# Patient Record
Sex: Male | Born: 1940 | Race: White | Hispanic: No | Smoking: Former smoker
Health system: Southern US, Community
[De-identification: ages and names within clinical notes are randomized; demographics above are authoritative.]

## PROBLEM LIST (undated history)

## (undated) ENCOUNTER — Emergency Department (HOSPITAL_COMMUNITY): Admission: EM | Payer: Self-pay | Source: Home / Self Care

## (undated) DIAGNOSIS — I2510 Atherosclerotic heart disease of native coronary artery without angina pectoris: Secondary | ICD-10-CM

## (undated) DIAGNOSIS — I4891 Unspecified atrial fibrillation: Secondary | ICD-10-CM

## (undated) DIAGNOSIS — Z9289 Personal history of other medical treatment: Secondary | ICD-10-CM

## (undated) DIAGNOSIS — I5032 Chronic diastolic (congestive) heart failure: Secondary | ICD-10-CM

## (undated) DIAGNOSIS — E785 Hyperlipidemia, unspecified: Secondary | ICD-10-CM

## (undated) DIAGNOSIS — I7789 Other specified disorders of arteries and arterioles: Secondary | ICD-10-CM

## (undated) DIAGNOSIS — I9789 Other postprocedural complications and disorders of the circulatory system, not elsewhere classified: Secondary | ICD-10-CM

## (undated) HISTORY — DX: Personal history of other medical treatment: Z92.89

## (undated) HISTORY — DX: Hyperlipidemia, unspecified: E78.5

## (undated) HISTORY — DX: Atherosclerotic heart disease of native coronary artery without angina pectoris: I25.10

## (undated) HISTORY — PX: HERNIA REPAIR: SHX51

## (undated) HISTORY — DX: Unspecified atrial fibrillation: I97.89

## (undated) HISTORY — DX: Chronic diastolic (congestive) heart failure: I50.32

## (undated) HISTORY — DX: Unspecified atrial fibrillation: I48.91

## (undated) HISTORY — DX: Other specified disorders of arteries and arterioles: I77.89

---

## 2018-03-22 ENCOUNTER — Ambulatory Visit (HOSPITAL_COMMUNITY): Admit: 2018-03-22 | Payer: Self-pay | Admitting: Internal Medicine

## 2018-03-22 ENCOUNTER — Inpatient Hospital Stay (HOSPITAL_COMMUNITY): Payer: Medicare Other | Admitting: Certified Registered Nurse Anesthetist

## 2018-03-22 ENCOUNTER — Encounter (HOSPITAL_COMMUNITY): Payer: Self-pay | Admitting: Thoracic Surgery (Cardiothoracic Vascular Surgery)

## 2018-03-22 ENCOUNTER — Encounter (HOSPITAL_COMMUNITY)
Admission: EM | Disposition: A | Discharge status: Home Health | Payer: Self-pay | Source: Other Acute Inpatient Hospital | Attending: Thoracic Surgery (Cardiothoracic Vascular Surgery)

## 2018-03-22 ENCOUNTER — Inpatient Hospital Stay (HOSPITAL_COMMUNITY): Payer: Medicare Other

## 2018-03-22 ENCOUNTER — Inpatient Hospital Stay (HOSPITAL_COMMUNITY)
Admission: EM | Admit: 2018-03-22 | Discharge: 2018-03-31 | DRG: 233 | Disposition: A | Discharge status: Home Health | Payer: Medicare Other | Source: Other Acute Inpatient Hospital | Attending: Thoracic Surgery (Cardiothoracic Vascular Surgery) | Admitting: Thoracic Surgery (Cardiothoracic Vascular Surgery)

## 2018-03-22 DIAGNOSIS — Z87891 Personal history of nicotine dependence: Secondary | ICD-10-CM

## 2018-03-22 DIAGNOSIS — R001 Bradycardia, unspecified: Secondary | ICD-10-CM | POA: Diagnosis present

## 2018-03-22 DIAGNOSIS — D696 Thrombocytopenia, unspecified: Secondary | ICD-10-CM | POA: Diagnosis not present

## 2018-03-22 DIAGNOSIS — J9801 Acute bronchospasm: Secondary | ICD-10-CM | POA: Diagnosis not present

## 2018-03-22 DIAGNOSIS — I5021 Acute systolic (congestive) heart failure: Secondary | ICD-10-CM | POA: Diagnosis not present

## 2018-03-22 DIAGNOSIS — I2111 ST elevation (STEMI) myocardial infarction involving right coronary artery: Principal | ICD-10-CM | POA: Diagnosis present

## 2018-03-22 DIAGNOSIS — D72829 Elevated white blood cell count, unspecified: Secondary | ICD-10-CM | POA: Diagnosis not present

## 2018-03-22 DIAGNOSIS — I440 Atrioventricular block, first degree: Secondary | ICD-10-CM | POA: Diagnosis not present

## 2018-03-22 DIAGNOSIS — R066 Hiccough: Secondary | ICD-10-CM | POA: Diagnosis not present

## 2018-03-22 DIAGNOSIS — I480 Paroxysmal atrial fibrillation: Secondary | ICD-10-CM | POA: Diagnosis not present

## 2018-03-22 DIAGNOSIS — D62 Acute posthemorrhagic anemia: Secondary | ICD-10-CM | POA: Diagnosis not present

## 2018-03-22 DIAGNOSIS — I9789 Other postprocedural complications and disorders of the circulatory system, not elsewhere classified: Secondary | ICD-10-CM

## 2018-03-22 DIAGNOSIS — I4891 Unspecified atrial fibrillation: Secondary | ICD-10-CM

## 2018-03-22 DIAGNOSIS — Z951 Presence of aortocoronary bypass graft: Secondary | ICD-10-CM | POA: Diagnosis not present

## 2018-03-22 DIAGNOSIS — I25110 Atherosclerotic heart disease of native coronary artery with unstable angina pectoris: Secondary | ICD-10-CM

## 2018-03-22 DIAGNOSIS — Y838 Other surgical procedures as the cause of abnormal reaction of the patient, or of later complication, without mention of misadventure at the time of the procedure: Secondary | ICD-10-CM | POA: Diagnosis not present

## 2018-03-22 DIAGNOSIS — I2119 ST elevation (STEMI) myocardial infarction involving other coronary artery of inferior wall: Secondary | ICD-10-CM

## 2018-03-22 DIAGNOSIS — I255 Ischemic cardiomyopathy: Secondary | ICD-10-CM | POA: Diagnosis present

## 2018-03-22 DIAGNOSIS — I2510 Atherosclerotic heart disease of native coronary artery without angina pectoris: Secondary | ICD-10-CM

## 2018-03-22 DIAGNOSIS — N182 Chronic kidney disease, stage 2 (mild): Secondary | ICD-10-CM | POA: Diagnosis present

## 2018-03-22 DIAGNOSIS — I5030 Unspecified diastolic (congestive) heart failure: Secondary | ICD-10-CM | POA: Diagnosis not present

## 2018-03-22 DIAGNOSIS — M5030 Other cervical disc degeneration, unspecified cervical region: Secondary | ICD-10-CM | POA: Diagnosis present

## 2018-03-22 DIAGNOSIS — I25119 Atherosclerotic heart disease of native coronary artery with unspecified angina pectoris: Secondary | ICD-10-CM | POA: Diagnosis present

## 2018-03-22 DIAGNOSIS — Z9889 Other specified postprocedural states: Secondary | ICD-10-CM

## 2018-03-22 DIAGNOSIS — N179 Acute kidney failure, unspecified: Secondary | ICD-10-CM | POA: Diagnosis not present

## 2018-03-22 DIAGNOSIS — K5900 Constipation, unspecified: Secondary | ICD-10-CM | POA: Diagnosis not present

## 2018-03-22 DIAGNOSIS — R5381 Other malaise: Secondary | ICD-10-CM | POA: Diagnosis present

## 2018-03-22 DIAGNOSIS — E876 Hypokalemia: Secondary | ICD-10-CM | POA: Diagnosis not present

## 2018-03-22 DIAGNOSIS — J9811 Atelectasis: Secondary | ICD-10-CM

## 2018-03-22 DIAGNOSIS — T8131XA Disruption of external operation (surgical) wound, not elsewhere classified, initial encounter: Secondary | ICD-10-CM | POA: Diagnosis not present

## 2018-03-22 HISTORY — DX: Atherosclerotic heart disease of native coronary artery without angina pectoris: I25.10

## 2018-03-22 HISTORY — PX: TEE WITHOUT CARDIOVERSION: SHX5443

## 2018-03-22 HISTORY — PX: CORONARY/GRAFT ACUTE MI REVASCULARIZATION: CATH118305

## 2018-03-22 HISTORY — PX: IABP INSERTION: CATH118242

## 2018-03-22 HISTORY — PX: LEFT HEART CATH AND CORONARY ANGIOGRAPHY: CATH118249

## 2018-03-22 HISTORY — PX: CORONARY ARTERY BYPASS GRAFT: SHX141

## 2018-03-22 LAB — POCT I-STAT, CHEM 8
BUN: 14 mg/dL (ref 8–23)
BUN: 15 mg/dL (ref 8–23)
BUN: 14 mg/dL (ref 8–23)
BUN: 14 mg/dL (ref 8–23)
BUN: 16 mg/dL (ref 8–23)
BUN: 15 mg/dL (ref 8–23)
CALCIUM ION: 0.98 mmol/L — AB (ref 1.15–1.40)
CHLORIDE: 107 mmol/L (ref 98–111)
CHLORIDE: 108 mmol/L (ref 98–111)
CHLORIDE: 105 mmol/L (ref 98–111)
CHLORIDE: 105 mmol/L (ref 98–111)
CREATININE: 0.7 mg/dL (ref 0.61–1.24)
CREATININE: 0.7 mg/dL (ref 0.61–1.24)
Calcium, Ion: 1.19 mmol/L (ref 1.15–1.40)
Calcium, Ion: 1.13 mmol/L — ABNORMAL LOW (ref 1.15–1.40)
Calcium, Ion: 1 mmol/L — ABNORMAL LOW (ref 1.15–1.40)
Calcium, Ion: 1 mmol/L — ABNORMAL LOW (ref 1.15–1.40)
Calcium, Ion: 0.95 mmol/L — ABNORMAL LOW (ref 1.15–1.40)
Chloride: 105 mmol/L (ref 98–111)
Chloride: 107 mmol/L (ref 98–111)
Creatinine, Ser: 0.8 mg/dL (ref 0.61–1.24)
Creatinine, Ser: 0.9 mg/dL (ref 0.61–1.24)
Creatinine, Ser: 0.8 mg/dL (ref 0.61–1.24)
Creatinine, Ser: 0.8 mg/dL (ref 0.61–1.24)
GLUCOSE: 127 mg/dL — AB (ref 70–99)
Glucose, Bld: 108 mg/dL — ABNORMAL HIGH (ref 70–99)
Glucose, Bld: 138 mg/dL — ABNORMAL HIGH (ref 70–99)
Glucose, Bld: 186 mg/dL — ABNORMAL HIGH (ref 70–99)
Glucose, Bld: 189 mg/dL — ABNORMAL HIGH (ref 70–99)
Glucose, Bld: 168 mg/dL — ABNORMAL HIGH (ref 70–99)
HCT: 36 % — ABNORMAL LOW (ref 39.0–52.0)
HCT: 28 % — ABNORMAL LOW (ref 39.0–52.0)
HEMATOCRIT: 39 % (ref 39.0–52.0)
HEMATOCRIT: 30 % — AB (ref 39.0–52.0)
HEMATOCRIT: 28 % — AB (ref 39.0–52.0)
HEMATOCRIT: 26 % — AB (ref 39.0–52.0)
HEMOGLOBIN: 13.3 g/dL (ref 13.0–17.0)
HEMOGLOBIN: 8.8 g/dL — AB (ref 13.0–17.0)
Hemoglobin: 12.2 g/dL — ABNORMAL LOW (ref 13.0–17.0)
Hemoglobin: 10.2 g/dL — ABNORMAL LOW (ref 13.0–17.0)
Hemoglobin: 9.5 g/dL — ABNORMAL LOW (ref 13.0–17.0)
Hemoglobin: 9.5 g/dL — ABNORMAL LOW (ref 13.0–17.0)
POTASSIUM: 4 mmol/L (ref 3.5–5.1)
POTASSIUM: 5.3 mmol/L — AB (ref 3.5–5.1)
POTASSIUM: 5.2 mmol/L — AB (ref 3.5–5.1)
Potassium: 5.3 mmol/L — ABNORMAL HIGH (ref 3.5–5.1)
Potassium: 6 mmol/L — ABNORMAL HIGH (ref 3.5–5.1)
Potassium: 6.3 mmol/L (ref 3.5–5.1)
SODIUM: 139 mmol/L (ref 135–145)
SODIUM: 137 mmol/L (ref 135–145)
Sodium: 141 mmol/L (ref 135–145)
Sodium: 138 mmol/L (ref 135–145)
Sodium: 137 mmol/L (ref 135–145)
Sodium: 140 mmol/L (ref 135–145)
TCO2: 25 mmol/L (ref 22–32)
TCO2: 24 mmol/L (ref 22–32)
TCO2: 26 mmol/L (ref 22–32)
TCO2: 26 mmol/L (ref 22–32)
TCO2: 27 mmol/L (ref 22–32)
TCO2: 25 mmol/L (ref 22–32)

## 2018-03-22 LAB — PROTIME-INR
INR: 1.26
INR: 1.54
Prothrombin Time: 15.6 seconds — ABNORMAL HIGH (ref 11.4–15.2)
Prothrombin Time: 18.3 seconds — ABNORMAL HIGH (ref 11.4–15.2)

## 2018-03-22 LAB — POCT I-STAT 4, (NA,K, GLUC, HGB,HCT)
GLUCOSE: 151 mg/dL — AB (ref 70–99)
Glucose, Bld: 124 mg/dL — ABNORMAL HIGH (ref 70–99)
HCT: 38 % — ABNORMAL LOW (ref 39.0–52.0)
HEMATOCRIT: 30 % — AB (ref 39.0–52.0)
HEMOGLOBIN: 12.9 g/dL — AB (ref 13.0–17.0)
Hemoglobin: 10.2 g/dL — ABNORMAL LOW (ref 13.0–17.0)
POTASSIUM: 5.1 mmol/L (ref 3.5–5.1)
Potassium: 5.1 mmol/L (ref 3.5–5.1)
Sodium: 140 mmol/L (ref 135–145)
Sodium: 142 mmol/L (ref 135–145)

## 2018-03-22 LAB — LIPID PANEL
CHOL/HDL RATIO: 5.6 ratio
CHOLESTEROL: 225 mg/dL — AB (ref 0–200)
HDL: 40 mg/dL — ABNORMAL LOW (ref >40)
LDL Cholesterol: 169 mg/dL — ABNORMAL HIGH (ref 0–99)
Triglycerides: 78 mg/dL (ref <150)
VLDL: 16 mg/dL (ref 0–40)

## 2018-03-22 LAB — POCT I-STAT 3, ART BLOOD GAS (G3+)
ACID-BASE DEFICIT: 4 mmol/L — AB (ref 0.0–2.0)
ACID-BASE DEFICIT: 2 mmol/L (ref 0.0–2.0)
BICARBONATE: 23.2 mmol/L (ref 20.0–28.0)
Bicarbonate: 23 mmol/L (ref 20.0–28.0)
O2 SAT: 100 %
O2 Saturation: 100 %
PO2 ART: 312 mmHg — AB (ref 83.0–108.0)
PO2 ART: 367 mmHg — AB (ref 83.0–108.0)
TCO2: 25 mmol/L (ref 22–32)
TCO2: 24 mmol/L (ref 22–32)
pCO2 arterial: 49.9 mmHg — ABNORMAL HIGH (ref 32.0–48.0)
pCO2 arterial: 42.5 mmHg (ref 32.0–48.0)
pH, Arterial: 7.272 — ABNORMAL LOW (ref 7.350–7.450)
pH, Arterial: 7.345 — ABNORMAL LOW (ref 7.350–7.450)

## 2018-03-22 LAB — COMPREHENSIVE METABOLIC PANEL
ALBUMIN: 3.6 g/dL (ref 3.5–5.0)
ALT: 43 U/L (ref 0–44)
AST: 175 U/L — AB (ref 15–41)
Alkaline Phosphatase: 51 U/L (ref 38–126)
Anion gap: 8 (ref 5–15)
BILIRUBIN TOTAL: 0.7 mg/dL (ref 0.3–1.2)
BUN: 13 mg/dL (ref 8–23)
CO2: 19 mmol/L — ABNORMAL LOW (ref 22–32)
CREATININE: 0.96 mg/dL (ref 0.61–1.24)
Calcium: 8.5 mg/dL — ABNORMAL LOW (ref 8.9–10.3)
Chloride: 111 mmol/L (ref 98–111)
GFR calc Af Amer: >60 mL/min (ref >60)
GLUCOSE: 129 mg/dL — AB (ref 70–99)
Potassium: 4 mmol/L (ref 3.5–5.1)
Sodium: 138 mmol/L (ref 135–145)
TOTAL PROTEIN: 6.1 g/dL — AB (ref 6.5–8.1)

## 2018-03-22 LAB — CBC
HCT: 44.3 % (ref 39.0–52.0)
HEMATOCRIT: 33 % — AB (ref 39.0–52.0)
HEMOGLOBIN: 14.7 g/dL (ref 13.0–17.0)
Hemoglobin: 10.9 g/dL — ABNORMAL LOW (ref 13.0–17.0)
MCH: 30.8 pg (ref 26.0–34.0)
MCH: 31.9 pg (ref 26.0–34.0)
MCHC: 33.2 g/dL (ref 30.0–36.0)
MCHC: 33 g/dL (ref 30.0–36.0)
MCV: 92.7 fL (ref 80.0–100.0)
MCV: 96.5 fL (ref 80.0–100.0)
NRBC: 0.1 % (ref 0.0–0.2)
Platelets: 222 10*3/uL (ref 150–400)
Platelets: 145 10*3/uL — ABNORMAL LOW (ref 150–400)
RBC: 4.78 MIL/uL (ref 4.22–5.81)
RBC: 3.42 MIL/uL — ABNORMAL LOW (ref 4.22–5.81)
RDW: 12.5 % (ref 11.5–15.5)
RDW: 12.7 % (ref 11.5–15.5)
WBC: 11.9 10*3/uL — AB (ref 4.0–10.5)
WBC: 23.7 10*3/uL — AB (ref 4.0–10.5)
nRBC: 0 % (ref 0.0–0.2)

## 2018-03-22 LAB — HEMOGLOBIN A1C
HEMOGLOBIN A1C: 5.4 % (ref 4.8–5.6)
MEAN PLASMA GLUCOSE: 108.28 mg/dL

## 2018-03-22 LAB — BASIC METABOLIC PANEL
Anion gap: 7 (ref 5–15)
BUN: 13 mg/dL (ref 8–23)
CHLORIDE: 110 mmol/L (ref 98–111)
CO2: 21 mmol/L — ABNORMAL LOW (ref 22–32)
CREATININE: 0.92 mg/dL (ref 0.61–1.24)
Calcium: 8.3 mg/dL — ABNORMAL LOW (ref 8.9–10.3)
GFR calc Af Amer: >60 mL/min (ref >60)
GFR calc non Af Amer: >60 mL/min (ref >60)
GLUCOSE: 111 mg/dL — AB (ref 70–99)
POTASSIUM: 3.8 mmol/L (ref 3.5–5.1)
Sodium: 138 mmol/L (ref 135–145)

## 2018-03-22 LAB — PLATELET COUNT: Platelets: 211 10*3/uL (ref 150–400)

## 2018-03-22 LAB — APTT: APTT: 41 s — AB (ref 24–36)

## 2018-03-22 LAB — POCT ACTIVATED CLOTTING TIME
ACTIVATED CLOTTING TIME: 230 s
ACTIVATED CLOTTING TIME: 230 s
Activated Clotting Time: 197 seconds
Activated Clotting Time: 230 seconds
Activated Clotting Time: 224 seconds
Activated Clotting Time: 235 seconds

## 2018-03-22 LAB — HEMOGLOBIN AND HEMATOCRIT, BLOOD
HCT: 30.6 % — ABNORMAL LOW (ref 39.0–52.0)
Hemoglobin: 10.5 g/dL — ABNORMAL LOW (ref 13.0–17.0)

## 2018-03-22 LAB — TROPONIN I: TROPONIN I: 15.1 ng/mL — AB (ref <0.03)

## 2018-03-22 LAB — ABO/RH: ABO/RH(D): A POS

## 2018-03-22 SURGERY — CORONARY ARTERY BYPASS GRAFTING (CABG)
Anesthesia: General | Site: Chest

## 2018-03-22 SURGERY — LEFT HEART CATH AND CORONARY ANGIOGRAPHY
Anesthesia: LOCAL

## 2018-03-22 MED ORDER — SODIUM CHLORIDE 0.9 % IV SOLN
1.5000 g | Freq: Two times a day (BID) | INTRAVENOUS | Status: AC
  Administered 2018-03-23 – 2018-03-24 (×4): 1.5 g via INTRAVENOUS
  Filled 2018-03-22 (×4): qty 1.5

## 2018-03-22 MED ORDER — POTASSIUM CHLORIDE 2 MEQ/ML IV SOLN
80.0000 meq | INTRAVENOUS | Status: DC
  Filled 2018-03-22 (×2): qty 40

## 2018-03-22 MED ORDER — MAGNESIUM SULFATE 4 GM/100ML IV SOLN
4.0000 g | Freq: Once | INTRAVENOUS | Status: AC
  Administered 2018-03-22: 4 g via INTRAVENOUS
  Filled 2018-03-22: qty 100

## 2018-03-22 MED ORDER — ALBUMIN HUMAN 5 % IV SOLN
250.0000 mL | INTRAVENOUS | Status: AC | PRN
  Administered 2018-03-22 – 2018-03-23 (×4): 12.5 g via INTRAVENOUS
  Filled 2018-03-22: qty 500
  Filled 2018-03-22: qty 250

## 2018-03-22 MED ORDER — NITROGLYCERIN IN D5W 200-5 MCG/ML-% IV SOLN
2.0000 ug/min | INTRAVENOUS | Status: AC
  Administered 2018-03-22: 15 ug/min via INTRAVENOUS
  Filled 2018-03-22: qty 250

## 2018-03-22 MED ORDER — POTASSIUM CHLORIDE 10 MEQ/50ML IV SOLN: 10.0000 meq | INTRAVENOUS | Status: DC

## 2018-03-22 MED ORDER — INSULIN REGULAR BOLUS VIA INFUSION
0.0000 [IU] | Freq: Three times a day (TID) | INTRAVENOUS | Status: DC
  Filled 2018-03-22: qty 10

## 2018-03-22 MED ORDER — METOPROLOL TARTRATE 12.5 MG HALF TABLET: 12.5000 mg | ORAL_TABLET | Freq: Two times a day (BID) | ORAL | Status: DC

## 2018-03-22 MED ORDER — CANGRELOR BOLUS VIA INFUSION
INTRAVENOUS | Status: DC | PRN
  Administered 2018-03-22: 2880 ug via INTRAVENOUS

## 2018-03-22 MED ORDER — ASPIRIN 81 MG PO CHEW: 81.0000 mg | CHEWABLE_TABLET | Freq: Every day | ORAL | Status: DC

## 2018-03-22 MED ORDER — PROPOFOL 10 MG/ML IV BOLUS
INTRAVENOUS | Status: AC
  Filled 2018-03-22: qty 20

## 2018-03-22 MED ORDER — TRANEXAMIC ACID (OHS) BOLUS VIA INFUSION
15.0000 mg/kg | INTRAVENOUS | Status: AC
  Administered 2018-03-22: 1440 mg via INTRAVENOUS
  Filled 2018-03-22: qty 1440

## 2018-03-22 MED ORDER — OXYCODONE HCL 5 MG PO TABS
5.0000 mg | ORAL_TABLET | ORAL | Status: DC | PRN
  Administered 2018-03-23: 10 mg via ORAL
  Administered 2018-03-23: 5 mg via ORAL
  Administered 2018-03-23 – 2018-03-25 (×5): 10 mg via ORAL
  Filled 2018-03-22: qty 2
  Filled 2018-03-22: qty 1
  Filled 2018-03-22 (×6): qty 2

## 2018-03-22 MED ORDER — HEPARIN (PORCINE) IN NACL 1000-0.9 UT/500ML-% IV SOLN
INTRAVENOUS | Status: AC
  Filled 2018-03-22: qty 500

## 2018-03-22 MED ORDER — ROCURONIUM BROMIDE 50 MG/5ML IV SOSY
PREFILLED_SYRINGE | INTRAVENOUS | Status: AC
  Filled 2018-03-22: qty 10

## 2018-03-22 MED ORDER — METOPROLOL TARTRATE 25 MG/10 ML ORAL SUSPENSION: 12.5000 mg | Freq: Two times a day (BID) | ORAL | Status: DC

## 2018-03-22 MED ORDER — NOREPINEPHRINE 4 MG/250ML-% IV SOLN
0.0000 ug/min | INTRAVENOUS | Status: DC
  Filled 2018-03-22: qty 250

## 2018-03-22 MED ORDER — FAMOTIDINE IN NACL 20-0.9 MG/50ML-% IV SOLN
20.0000 mg | Freq: Two times a day (BID) | INTRAVENOUS | Status: AC
  Administered 2018-03-22 – 2018-03-23 (×2): 20 mg via INTRAVENOUS
  Filled 2018-03-22: qty 50

## 2018-03-22 MED ORDER — PROTAMINE SULFATE 10 MG/ML IV SOLN
INTRAVENOUS | Status: DC | PRN
  Administered 2018-03-22: 140 mg via INTRAVENOUS
  Administered 2018-03-22: 10 mg via INTRAVENOUS
  Administered 2018-03-22: 150 mg via INTRAVENOUS

## 2018-03-22 MED ORDER — ONDANSETRON HCL 4 MG/2ML IJ SOLN: 4.0000 mg | Freq: Four times a day (QID) | INTRAMUSCULAR | Status: DC | PRN

## 2018-03-22 MED ORDER — 0.9 % SODIUM CHLORIDE (POUR BTL) OPTIME
TOPICAL | Status: DC | PRN
  Administered 2018-03-22: 1000 mL
  Administered 2018-03-22: 5000 mL

## 2018-03-22 MED ORDER — FENTANYL CITRATE (PF) 250 MCG/5ML IJ SOLN
INTRAMUSCULAR | Status: AC
  Filled 2018-03-22: qty 20

## 2018-03-22 MED ORDER — SODIUM CHLORIDE 0.45 % IV SOLN
INTRAVENOUS | Status: DC | PRN
  Administered 2018-03-22 – 2018-03-23 (×2): via INTRAVENOUS

## 2018-03-22 MED ORDER — VECURONIUM BROMIDE 10 MG IV SOLR
INTRAVENOUS | Status: DC | PRN
  Administered 2018-03-22 (×2): 5 mg via INTRAVENOUS
  Administered 2018-03-22: 3 mg via INTRAVENOUS

## 2018-03-22 MED ORDER — PLASMA-LYTE 148 IV SOLN
INTRAVENOUS | Status: DC | PRN
  Administered 2018-03-22: 500 mL

## 2018-03-22 MED ORDER — EPINEPHRINE PF 1 MG/ML IJ SOLN
0.0000 ug/min | INTRAVENOUS | Status: DC
  Filled 2018-03-22 (×2): qty 4

## 2018-03-22 MED ORDER — BISACODYL 10 MG RE SUPP: 10.0000 mg | Freq: Every day | RECTAL | Status: DC

## 2018-03-22 MED ORDER — PHENYLEPHRINE 40 MCG/ML (10ML) SYRINGE FOR IV PUSH (FOR BLOOD PRESSURE SUPPORT)
PREFILLED_SYRINGE | INTRAVENOUS | Status: AC
  Filled 2018-03-22: qty 10

## 2018-03-22 MED ORDER — PHENYLEPHRINE HCL-NACL 20-0.9 MG/250ML-% IV SOLN
30.0000 ug/min | INTRAVENOUS | Status: AC
  Administered 2018-03-22: 25 ug/min via INTRAVENOUS
  Filled 2018-03-22 (×2): qty 250

## 2018-03-22 MED ORDER — VANCOMYCIN HCL 1000 MG IV SOLR
INTRAVENOUS | Status: DC | PRN
  Administered 2018-03-22: 1000 mL

## 2018-03-22 MED ORDER — SODIUM CHLORIDE 0.9 % IV SOLN
INTRAVENOUS | Status: DC | PRN
  Administered 2018-03-22: 18:00:00 via INTRAVENOUS

## 2018-03-22 MED ORDER — CANGRELOR TETRASODIUM 50 MG IV SOLR
INTRAVENOUS | Status: AC
  Filled 2018-03-22: qty 50

## 2018-03-22 MED ORDER — CHLORHEXIDINE GLUCONATE 0.12 % MT SOLN
15.0000 mL | OROMUCOSAL | Status: AC
  Administered 2018-03-22: 15 mL via OROMUCOSAL

## 2018-03-22 MED ORDER — ALBUMIN HUMAN 5 % IV SOLN
INTRAVENOUS | Status: DC | PRN
  Administered 2018-03-22 (×3): via INTRAVENOUS

## 2018-03-22 MED ORDER — SODIUM CHLORIDE 0.9 % IV SOLN
INTRAVENOUS | Status: DC | PRN
  Administered 2018-03-22: 750 mg via INTRAVENOUS

## 2018-03-22 MED ORDER — FENTANYL CITRATE (PF) 250 MCG/5ML IJ SOLN
INTRAMUSCULAR | Status: AC
  Filled 2018-03-22: qty 5

## 2018-03-22 MED ORDER — DEXMEDETOMIDINE HCL IN NACL 400 MCG/100ML IV SOLN
0.1000 ug/kg/h | INTRAVENOUS | Status: AC
  Administered 2018-03-22: 0.7 ug/kg/h via INTRAVENOUS
  Filled 2018-03-22 (×2): qty 1000

## 2018-03-22 MED ORDER — SODIUM CHLORIDE 0.9 % IV SOLN: 250.0000 mL | INTRAVENOUS | Status: DC | PRN

## 2018-03-22 MED ORDER — ASPIRIN EC 325 MG PO TBEC
325.0000 mg | DELAYED_RELEASE_TABLET | Freq: Every day | ORAL | Status: DC
  Administered 2018-03-23: 325 mg via ORAL
  Filled 2018-03-22: qty 1

## 2018-03-22 MED ORDER — VERAPAMIL HCL 2.5 MG/ML IV SOLN
INTRAVENOUS | Status: AC
  Filled 2018-03-22: qty 2

## 2018-03-22 MED ORDER — MORPHINE SULFATE (PF) 2 MG/ML IV SOLN
1.0000 mg | INTRAVENOUS | Status: AC | PRN
  Administered 2018-03-22: 2 mg via INTRAVENOUS
  Filled 2018-03-22: qty 1

## 2018-03-22 MED ORDER — CALCIUM CHLORIDE 10 % IV SOLN
INTRAVENOUS | Status: DC | PRN
  Administered 2018-03-22 (×8): 100 mg via INTRAVENOUS

## 2018-03-22 MED ORDER — LACTATED RINGERS IV SOLN
INTRAVENOUS | Status: DC | PRN
  Administered 2018-03-22 (×3): via INTRAVENOUS

## 2018-03-22 MED ORDER — SODIUM CHLORIDE 0.9 % IV SOLN
INTRAVENOUS | Status: DC
  Filled 2018-03-22 (×2): qty 30

## 2018-03-22 MED ORDER — NOREPINEPHRINE 4 MG/250ML-% IV SOLN
0.0000 ug/min | INTRAVENOUS | Status: DC
  Administered 2018-03-22 – 2018-03-24 (×3): 5 ug/min via INTRAVENOUS
  Filled 2018-03-22 (×3): qty 250

## 2018-03-22 MED ORDER — LACTATED RINGERS IV SOLN
INTRAVENOUS | Status: DC | PRN
  Administered 2018-03-22 (×2): via INTRAVENOUS

## 2018-03-22 MED ORDER — SODIUM CHLORIDE 0.9 % IV SOLN
INTRAVENOUS | Status: DC
  Administered 2018-03-22: 21:00:00 via INTRAVENOUS
  Administered 2018-03-23: 500 mL via INTRAVENOUS

## 2018-03-22 MED ORDER — VANCOMYCIN HCL 1000 MG IV SOLR
INTRAVENOUS | Status: AC
  Filled 2018-03-22 (×2): qty 1000

## 2018-03-22 MED ORDER — NITROGLYCERIN IN D5W 200-5 MCG/ML-% IV SOLN: 0.0000 ug/min | INTRAVENOUS | Status: DC

## 2018-03-22 MED ORDER — LACTATED RINGERS IV SOLN
INTRAVENOUS | Status: DC
  Administered 2018-03-22: 21:00:00 via INTRAVENOUS

## 2018-03-22 MED ORDER — ONDANSETRON HCL 4 MG/2ML IJ SOLN
INTRAMUSCULAR | Status: AC
  Filled 2018-03-22: qty 2

## 2018-03-22 MED ORDER — VANCOMYCIN HCL 10 G IV SOLR
1500.0000 mg | INTRAVENOUS | Status: AC
  Administered 2018-03-22: 1500 mg via INTRAVENOUS
  Filled 2018-03-22 (×2): qty 1500

## 2018-03-22 MED ORDER — PROPOFOL 10 MG/ML IV BOLUS
INTRAVENOUS | Status: DC | PRN
  Administered 2018-03-22: 250 mg via INTRAVENOUS

## 2018-03-22 MED ORDER — PROTAMINE SULFATE 10 MG/ML IV SOLN
INTRAVENOUS | Status: AC
  Filled 2018-03-22: qty 25

## 2018-03-22 MED ORDER — MIDAZOLAM HCL 5 MG/5ML IJ SOLN
INTRAMUSCULAR | Status: DC | PRN
  Administered 2018-03-22 (×3): 2 mg via INTRAVENOUS

## 2018-03-22 MED ORDER — PANTOPRAZOLE SODIUM 40 MG PO TBEC
40.0000 mg | DELAYED_RELEASE_TABLET | Freq: Every day | ORAL | Status: DC
  Administered 2018-03-24 – 2018-03-31 (×8): 40 mg via ORAL
  Filled 2018-03-22 (×8): qty 1

## 2018-03-22 MED ORDER — SODIUM CHLORIDE 0.9 % IV SOLN: 4.0000 ug/kg/min | INTRAVENOUS | Status: DC

## 2018-03-22 MED ORDER — DOPAMINE-DEXTROSE 3.2-5 MG/ML-% IV SOLN
0.0000 ug/kg/min | INTRAVENOUS | Status: AC
  Administered 2018-03-22: 2 ug/kg/min via INTRAVENOUS
  Filled 2018-03-22: qty 250

## 2018-03-22 MED ORDER — ACETAMINOPHEN 500 MG PO TABS
1000.0000 mg | ORAL_TABLET | Freq: Four times a day (QID) | ORAL | Status: AC
  Administered 2018-03-23 – 2018-03-27 (×18): 1000 mg via ORAL
  Filled 2018-03-22 (×19): qty 2

## 2018-03-22 MED ORDER — HEPARIN (PORCINE) IN NACL 1000-0.9 UT/500ML-% IV SOLN
INTRAVENOUS | Status: DC | PRN
  Administered 2018-03-22 (×3): 500 mL

## 2018-03-22 MED ORDER — ACETAMINOPHEN 650 MG RE SUPP
650.0000 mg | Freq: Once | RECTAL | Status: AC
  Administered 2018-03-22: 650 mg via RECTAL

## 2018-03-22 MED ORDER — HEPARIN SODIUM (PORCINE) 1000 UNIT/ML IJ SOLN
INTRAMUSCULAR | Status: DC | PRN
  Administered 2018-03-22 (×4): 2000 [IU] via INTRAVENOUS
  Administered 2018-03-22: 4000 [IU] via INTRAVENOUS
  Administered 2018-03-22: 5000 [IU] via INTRAVENOUS

## 2018-03-22 MED ORDER — FENTANYL CITRATE (PF) 100 MCG/2ML IJ SOLN
INTRAMUSCULAR | Status: DC | PRN
  Administered 2018-03-22: 150 ug via INTRAVENOUS
  Administered 2018-03-22: 50 ug via INTRAVENOUS
  Administered 2018-03-22: 250 ug via INTRAVENOUS
  Administered 2018-03-22: 50 ug via INTRAVENOUS
  Administered 2018-03-22: 100 ug via INTRAVENOUS
  Administered 2018-03-22: 50 ug via INTRAVENOUS
  Administered 2018-03-22: 500 ug via INTRAVENOUS
  Administered 2018-03-22 (×2): 100 ug via INTRAVENOUS

## 2018-03-22 MED ORDER — MIDAZOLAM HCL 2 MG/2ML IJ SOLN
2.0000 mg | INTRAMUSCULAR | Status: DC | PRN
  Administered 2018-03-22 (×2): 1 mg via INTRAVENOUS
  Filled 2018-03-22: qty 2

## 2018-03-22 MED ORDER — BISACODYL 5 MG PO TBEC
10.0000 mg | DELAYED_RELEASE_TABLET | Freq: Every day | ORAL | Status: DC
  Administered 2018-03-23 – 2018-03-31 (×7): 10 mg via ORAL
  Filled 2018-03-22 (×8): qty 2

## 2018-03-22 MED ORDER — LIDOCAINE 2% (20 MG/ML) 5 ML SYRINGE
INTRAMUSCULAR | Status: AC
  Filled 2018-03-22: qty 5

## 2018-03-22 MED ORDER — VECURONIUM BROMIDE 10 MG IV SOLR
INTRAVENOUS | Status: AC
  Filled 2018-03-22: qty 10

## 2018-03-22 MED ORDER — SODIUM CHLORIDE 0.9% FLUSH
3.0000 mL | Freq: Two times a day (BID) | INTRAVENOUS | Status: DC
  Administered 2018-03-22: 10 mL via INTRAVENOUS
  Administered 2018-03-23 (×2): 3 mL via INTRAVENOUS

## 2018-03-22 MED ORDER — SODIUM CHLORIDE 0.9% FLUSH: 3.0000 mL | INTRAVENOUS | Status: DC | PRN

## 2018-03-22 MED ORDER — ACETAMINOPHEN 160 MG/5ML PO SOLN: 650.0000 mg | Freq: Once | ORAL | Status: AC

## 2018-03-22 MED ORDER — ACETAMINOPHEN 160 MG/5ML PO SOLN: 1000.0000 mg | Freq: Four times a day (QID) | ORAL | Status: DC

## 2018-03-22 MED ORDER — TRAMADOL HCL 50 MG PO TABS
50.0000 mg | ORAL_TABLET | ORAL | Status: DC | PRN
  Administered 2018-03-24 – 2018-03-25 (×3): 50 mg via ORAL
  Administered 2018-03-26: 100 mg via ORAL
  Filled 2018-03-22: qty 2
  Filled 2018-03-22 (×3): qty 1

## 2018-03-22 MED ORDER — MIDAZOLAM HCL 10 MG/2ML IJ SOLN
INTRAMUSCULAR | Status: AC
  Filled 2018-03-22: qty 2

## 2018-03-22 MED ORDER — CALCIUM CHLORIDE 10 % IV SOLN
INTRAVENOUS | Status: AC
  Filled 2018-03-22: qty 20

## 2018-03-22 MED ORDER — TRANEXAMIC ACID 1000 MG/10ML IV SOLN
1.5000 mg/kg/h | INTRAVENOUS | Status: AC
  Administered 2018-03-22: 1.5 mg/kg/h via INTRAVENOUS
  Filled 2018-03-22 (×2): qty 25

## 2018-03-22 MED ORDER — SODIUM CHLORIDE 0.9% FLUSH
3.0000 mL | Freq: Two times a day (BID) | INTRAVENOUS | Status: DC
  Administered 2018-03-23 – 2018-03-25 (×4): 3 mL via INTRAVENOUS

## 2018-03-22 MED ORDER — PLASMA-LYTE 148 IV SOLN
INTRAVENOUS | Status: AC
  Filled 2018-03-22 (×2): qty 2.5

## 2018-03-22 MED ORDER — SODIUM BICARBONATE 8.4 % IV SOLN
50.0000 meq | Freq: Once | INTRAVENOUS | Status: AC
  Administered 2018-03-22: 50 meq via INTRAVENOUS

## 2018-03-22 MED ORDER — ASPIRIN 81 MG PO CHEW: 324.0000 mg | CHEWABLE_TABLET | Freq: Every day | ORAL | Status: DC

## 2018-03-22 MED ORDER — KENNESTONE BLOOD CARDIOPLEGIA VIAL
13.0000 mL | Freq: Once | Status: DC
  Filled 2018-03-22: qty 13

## 2018-03-22 MED ORDER — KENNESTONE BLOOD CARDIOPLEGIA (KBC) MANNITOL SYRINGE (20%, 32ML)
32.0000 mL | Freq: Once | INTRAVENOUS | Status: DC
  Filled 2018-03-22: qty 32

## 2018-03-22 MED ORDER — HYDRALAZINE HCL 20 MG/ML IJ SOLN: 5.0000 mg | INTRAMUSCULAR | Status: DC | PRN

## 2018-03-22 MED ORDER — DOCUSATE SODIUM 100 MG PO CAPS
200.0000 mg | ORAL_CAPSULE | Freq: Every day | ORAL | Status: DC
  Administered 2018-03-23 – 2018-03-31 (×8): 200 mg via ORAL
  Filled 2018-03-22 (×10): qty 2

## 2018-03-22 MED ORDER — MILRINONE LACTATE IN DEXTROSE 20-5 MG/100ML-% IV SOLN
0.2000 ug/kg/min | INTRAVENOUS | Status: DC
  Administered 2018-03-22: 0.2 ug/kg/min via INTRAVENOUS

## 2018-03-22 MED ORDER — INSULIN REGULAR(HUMAN) IN NACL 100-0.9 UT/100ML-% IV SOLN
INTRAVENOUS | Status: AC
  Administered 2018-03-22: .5 [IU]/h via INTRAVENOUS
  Filled 2018-03-22 (×2): qty 100

## 2018-03-22 MED ORDER — INSULIN REGULAR(HUMAN) IN NACL 100-0.9 UT/100ML-% IV SOLN: INTRAVENOUS | Status: DC

## 2018-03-22 MED ORDER — SODIUM CHLORIDE 0.9 % IV SOLN: INTRAVENOUS | Status: DC | PRN

## 2018-03-22 MED ORDER — ATROPINE SULFATE 1 MG/10ML IJ SOSY
PREFILLED_SYRINGE | INTRAMUSCULAR | Status: AC
  Filled 2018-03-22: qty 10

## 2018-03-22 MED ORDER — PROTAMINE SULFATE 10 MG/ML IV SOLN
INTRAVENOUS | Status: AC
  Filled 2018-03-22: qty 5

## 2018-03-22 MED ORDER — SODIUM CHLORIDE 0.9 % IJ SOLN
INTRAMUSCULAR | Status: AC
  Filled 2018-03-22: qty 20

## 2018-03-22 MED ORDER — MILRINONE LACTATE IN DEXTROSE 20-5 MG/100ML-% IV SOLN
0.3000 ug/kg/min | INTRAVENOUS | Status: AC
  Administered 2018-03-22: .2 ug/kg/min via INTRAVENOUS
  Filled 2018-03-22 (×2): qty 100

## 2018-03-22 MED ORDER — SODIUM CHLORIDE 0.9 % IV SOLN
1.5000 g | INTRAVENOUS | Status: AC
  Administered 2018-03-22: 1.5 g via INTRAVENOUS
  Filled 2018-03-22 (×2): qty 1.5

## 2018-03-22 MED ORDER — SODIUM CHLORIDE 0.9 % IV SOLN: 250.0000 mL | INTRAVENOUS | Status: DC

## 2018-03-22 MED ORDER — SODIUM CHLORIDE 0.9 % IV SOLN
INTRAVENOUS | Status: AC | PRN
  Administered 2018-03-22 (×2): 4 ug/kg/min via INTRAVENOUS

## 2018-03-22 MED ORDER — TRANEXAMIC ACID (OHS) PUMP PRIME SOLUTION
2.0000 mg/kg | INTRAVENOUS | Status: DC
  Filled 2018-03-22 (×2): qty 1.92

## 2018-03-22 MED ORDER — IOHEXOL 350 MG/ML SOLN
INTRAVENOUS | Status: DC | PRN
  Administered 2018-03-22: 90 mL via INTRA_ARTERIAL

## 2018-03-22 MED ORDER — DEXMEDETOMIDINE HCL IN NACL 200 MCG/50ML IV SOLN
INTRAVENOUS | Status: AC
  Filled 2018-03-22: qty 50

## 2018-03-22 MED ORDER — MORPHINE SULFATE (PF) 2 MG/ML IV SOLN
1.0000 mg | INTRAVENOUS | Status: DC | PRN
  Administered 2018-03-23 – 2018-03-26 (×6): 2 mg via INTRAVENOUS
  Filled 2018-03-22 (×7): qty 1

## 2018-03-22 MED ORDER — SODIUM CHLORIDE 0.9 % IV SOLN
INTRAVENOUS | Status: DC
  Administered 2018-03-22: 22:00:00 via INTRAVENOUS

## 2018-03-22 MED ORDER — ROCURONIUM BROMIDE 100 MG/10ML IV SOLN
INTRAVENOUS | Status: DC | PRN
  Administered 2018-03-22 (×2): 50 mg via INTRAVENOUS

## 2018-03-22 MED ORDER — HEPARIN SODIUM (PORCINE) 1000 UNIT/ML IJ SOLN
INTRAMUSCULAR | Status: DC | PRN
  Administered 2018-03-22: 27000 [IU] via INTRAVENOUS
  Administered 2018-03-22: 3000 [IU] via INTRAVENOUS

## 2018-03-22 MED ORDER — ATROPINE SULFATE 1 MG/10ML IJ SOSY
PREFILLED_SYRINGE | INTRAMUSCULAR | Status: DC | PRN
  Administered 2018-03-22: 0.5 mg via INTRAVENOUS

## 2018-03-22 MED ORDER — DEXMEDETOMIDINE HCL IN NACL 400 MCG/100ML IV SOLN
0.0000 ug/kg/h | INTRAVENOUS | Status: DC
  Administered 2018-03-22: 0.5 ug/kg/h via INTRAVENOUS

## 2018-03-22 MED ORDER — LACTATED RINGERS IV SOLN
500.0000 mL | Freq: Once | INTRAVENOUS | Status: AC | PRN
  Administered 2018-03-22: 500 mL via INTRAVENOUS

## 2018-03-22 MED ORDER — LIDOCAINE HCL (PF) 1 % IJ SOLN
INTRAMUSCULAR | Status: DC | PRN
  Administered 2018-03-22: 2 mL
  Administered 2018-03-22: 20 mL

## 2018-03-22 MED ORDER — LACTATED RINGERS IV SOLN
INTRAVENOUS | Status: DC
  Administered 2018-03-22 – 2018-03-23 (×2): via INTRAVENOUS

## 2018-03-22 MED ORDER — NITROGLYCERIN 1 MG/10 ML FOR IR/CATH LAB
INTRA_ARTERIAL | Status: AC
  Filled 2018-03-22: qty 10

## 2018-03-22 MED ORDER — ROCURONIUM BROMIDE 50 MG/5ML IV SOSY
PREFILLED_SYRINGE | INTRAVENOUS | Status: AC
  Filled 2018-03-22: qty 5

## 2018-03-22 MED ORDER — ACETAMINOPHEN 325 MG PO TABS: 650.0000 mg | ORAL_TABLET | ORAL | Status: DC | PRN

## 2018-03-22 MED ORDER — SODIUM CHLORIDE 0.9 % IV SOLN
750.0000 mg | INTRAVENOUS | Status: AC
  Filled 2018-03-22 (×2): qty 750

## 2018-03-22 MED ORDER — METOPROLOL TARTRATE 5 MG/5ML IV SOLN: 2.5000 mg | INTRAVENOUS | Status: DC | PRN

## 2018-03-22 MED ORDER — PHENYLEPHRINE HCL-NACL 20-0.9 MG/250ML-% IV SOLN
0.0000 ug/min | INTRAVENOUS | Status: DC
  Administered 2018-03-22: 50 ug/min via INTRAVENOUS
  Administered 2018-03-23: 100 ug/min via INTRAVENOUS
  Administered 2018-03-23: 70 ug/min via INTRAVENOUS
  Administered 2018-03-23: 90 ug/min via INTRAVENOUS
  Administered 2018-03-23 (×2): 100 ug/min via INTRAVENOUS
  Administered 2018-03-24: 55 ug/min via INTRAVENOUS
  Filled 2018-03-22 (×7): qty 250

## 2018-03-22 MED ORDER — HEMOSTATIC AGENTS (NO CHARGE) OPTIME
TOPICAL | Status: DC | PRN
  Administered 2018-03-22: 1 via TOPICAL
  Administered 2018-03-22: 3 via TOPICAL

## 2018-03-22 MED ORDER — MAGNESIUM SULFATE 50 % IJ SOLN
40.0000 meq | INTRAMUSCULAR | Status: DC
  Filled 2018-03-22 (×2): qty 9.85

## 2018-03-22 MED ORDER — DOPAMINE-DEXTROSE 3.2-5 MG/ML-% IV SOLN: 0.0000 ug/kg/min | INTRAVENOUS | Status: DC

## 2018-03-22 MED ORDER — VANCOMYCIN HCL IN DEXTROSE 1-5 GM/200ML-% IV SOLN
1000.0000 mg | Freq: Once | INTRAVENOUS | Status: AC
  Administered 2018-03-23: 1000 mg via INTRAVENOUS
  Filled 2018-03-22: qty 200

## 2018-03-22 SURGICAL SUPPLY — 114 items
ADAPTER CARDIO PERF ANTE/RETRO (ADAPTER) ×3 IMPLANT
APPLIER CLIP 9.375 SM OPEN (CLIP) ×6
BAG DECANTER FOR FLEXI CONT (MISCELLANEOUS) ×6 IMPLANT
BANDAGE ELASTIC 4 VELCRO ST LF (GAUZE/BANDAGES/DRESSINGS) ×6 IMPLANT
BANDAGE ELASTIC 6 VELCRO ST LF (GAUZE/BANDAGES/DRESSINGS) ×6 IMPLANT
BASKET HEART (ORDER IN 25'S) (MISCELLANEOUS) ×1
BASKET HEART (ORDER IN 25S) (MISCELLANEOUS) ×2 IMPLANT
BLADE CLIPPER SURG (BLADE) ×3 IMPLANT
BLADE STERNUM SYSTEM 6 (BLADE) ×3 IMPLANT
BLADE SURG 11 STRL SS (BLADE) ×3 IMPLANT
BNDG GAUZE ELAST 4 BULKY (GAUZE/BANDAGES/DRESSINGS) ×6 IMPLANT
CANISTER SUCT 3000ML PPV (MISCELLANEOUS) ×3 IMPLANT
CANNULA EZ GLIDE AORTIC 21FR (CANNULA) ×3 IMPLANT
CANNULA GUNDRY RCSP 15FR (MISCELLANEOUS) ×3 IMPLANT
CANNULA VESSEL 3MM BLUNT TIP (CANNULA) ×3 IMPLANT
CATH CPB KIT OWEN (MISCELLANEOUS) ×3 IMPLANT
CATH THORACIC 36FR (CATHETERS) ×3 IMPLANT
CLIP APPLIE 9.375 SM OPEN (CLIP) ×4 IMPLANT
CLIP RETRACTION 3.0MM CORONARY (MISCELLANEOUS) ×3 IMPLANT
CLIP VESOCCLUDE MED 24/CT (CLIP) IMPLANT
CLIP VESOCCLUDE SM WIDE 24/CT (CLIP) ×9 IMPLANT
CONN ST 1/4X3/8  BEN (MISCELLANEOUS) ×3
CONN ST 1/4X3/8 BEN (MISCELLANEOUS) ×6 IMPLANT
COVER PROBE W GEL 5X96 (DRAPES) ×3 IMPLANT
COVER WAND RF STERILE (DRAPES) ×6 IMPLANT
CRADLE DONUT ADULT HEAD (MISCELLANEOUS) ×3 IMPLANT
DERMABOND ADVANCED (GAUZE/BANDAGES/DRESSINGS) ×2
DERMABOND ADVANCED .7 DNX12 (GAUZE/BANDAGES/DRESSINGS) ×4 IMPLANT
DRAIN CHANNEL 32F RND 10.7 FF (WOUND CARE) ×6 IMPLANT
DRAPE CARDIOVASCULAR INCISE (DRAPES) ×1
DRAPE INCISE IOBAN 66X45 STRL (DRAPES) ×6 IMPLANT
DRAPE SLUSH/WARMER DISC (DRAPES) ×3 IMPLANT
DRAPE SRG 135X102X78XABS (DRAPES) ×2 IMPLANT
DRSG AQUACEL AG ADV 3.5X14 (GAUZE/BANDAGES/DRESSINGS) ×3 IMPLANT
DRSG COVADERM 4X14 (GAUZE/BANDAGES/DRESSINGS) ×3 IMPLANT
ELECT BLADE 4.0 EZ CLEAN MEGAD (MISCELLANEOUS) ×6
ELECT REM PT RETURN 9FT ADLT (ELECTROSURGICAL) ×6
ELECTRODE BLDE 4.0 EZ CLN MEGD (MISCELLANEOUS) ×4 IMPLANT
ELECTRODE REM PT RTRN 9FT ADLT (ELECTROSURGICAL) ×4 IMPLANT
FELT TEFLON 1X6 (MISCELLANEOUS) ×3 IMPLANT
GAUZE SPONGE 4X4 12PLY STRL LF (GAUZE/BANDAGES/DRESSINGS) ×9 IMPLANT
GLOVE BIO SURGEON STRL SZ 6.5 (GLOVE) ×18 IMPLANT
GLOVE BIOGEL PI IND STRL 8 (GLOVE) ×4 IMPLANT
GLOVE BIOGEL PI INDICATOR 8 (GLOVE) ×2
GLOVE ECLIPSE 8.0 STRL XLNG CF (GLOVE) ×9 IMPLANT
GLOVE ORTHO TXT STRL SZ7.5 (GLOVE) ×9 IMPLANT
GOWN STRL REUS W/ TWL LRG LVL3 (GOWN DISPOSABLE) ×14 IMPLANT
GOWN STRL REUS W/TWL LRG LVL3 (GOWN DISPOSABLE) ×7
INSERT FOGARTY XLG (MISCELLANEOUS) ×3 IMPLANT
IV CATH 22GX1 FEP (IV SOLUTION) ×3 IMPLANT
KIT BASIN OR (CUSTOM PROCEDURE TRAY) ×3 IMPLANT
KIT SUCTION CATH 14FR (SUCTIONS) ×9 IMPLANT
KIT TURNOVER KIT B (KITS) ×3 IMPLANT
KIT VASOVIEW HEMOPRO 2 VH 4000 (KITS) ×3 IMPLANT
LEAD PACING MYOCARDI (MISCELLANEOUS) ×3 IMPLANT
MARKER GRAFT CORONARY BYPASS (MISCELLANEOUS) ×9 IMPLANT
NS IRRIG 1000ML POUR BTL (IV SOLUTION) ×18 IMPLANT
PACK E OPEN HEART (SUTURE) ×3 IMPLANT
PACK OPEN HEART (CUSTOM PROCEDURE TRAY) ×3 IMPLANT
PAD ARMBOARD 7.5X6 YLW CONV (MISCELLANEOUS) ×6 IMPLANT
PAD ELECT DEFIB RADIOL ZOLL (MISCELLANEOUS) ×3 IMPLANT
PENCIL BUTTON HOLSTER BLD 10FT (ELECTRODE) ×6 IMPLANT
POWDER SURGICEL 3.0 GRAM (HEMOSTASIS) ×12 IMPLANT
PUNCH AORTIC ROT 4.0MM RCL 40 (MISCELLANEOUS) ×3 IMPLANT
PUNCH AORTIC ROTATE 4.0MM (MISCELLANEOUS) IMPLANT
PUNCH AORTIC ROTATE 4.5MM 8IN (MISCELLANEOUS) IMPLANT
PUNCH AORTIC ROTATE 5MM 8IN (MISCELLANEOUS) IMPLANT
SET CARDIOPLEGIA MPS 5001102 (MISCELLANEOUS) ×3 IMPLANT
SOLUTION ANTI FOG 6CC (MISCELLANEOUS) IMPLANT
SPONGE LAP 18X18 RF (DISPOSABLE) ×6 IMPLANT
SPONGE LAP 18X18 X RAY DECT (DISPOSABLE) ×24 IMPLANT
SPONGE LAP 4X18 RFD (DISPOSABLE) ×6 IMPLANT
SUT BONE WAX W31G (SUTURE) ×3 IMPLANT
SUT ETHIBOND NAB MH 2-0 36IN (SUTURE) ×6 IMPLANT
SUT ETHIBOND X763 2 0 SH 1 (SUTURE) ×6 IMPLANT
SUT MNCRL AB 3-0 PS2 18 (SUTURE) ×6 IMPLANT
SUT MNCRL AB 4-0 PS2 18 (SUTURE) ×9 IMPLANT
SUT PDS AB 1 CTX 36 (SUTURE) ×6 IMPLANT
SUT PROLENE 2 0 SH DA (SUTURE) IMPLANT
SUT PROLENE 3 0 SH DA (SUTURE) ×6 IMPLANT
SUT PROLENE 3 0 SH1 36 (SUTURE) IMPLANT
SUT PROLENE 4 0 RB 1 (SUTURE)
SUT PROLENE 4 0 SH DA (SUTURE) ×3 IMPLANT
SUT PROLENE 4-0 RB1 .5 CRCL 36 (SUTURE) IMPLANT
SUT PROLENE 5 0 C 1 36 (SUTURE) IMPLANT
SUT PROLENE 6 0 C 1 30 (SUTURE) ×12 IMPLANT
SUT PROLENE 7 0 BV1 MDA (SUTURE) ×3 IMPLANT
SUT PROLENE 7.0 RB 3 (SUTURE) ×21 IMPLANT
SUT PROLENE 8 0 BV175 6 (SUTURE) ×18 IMPLANT
SUT PROLENE BLUE 7 0 (SUTURE) ×3 IMPLANT
SUT PROLENE POLY MONO (SUTURE) IMPLANT
SUT SILK  1 MH (SUTURE) ×3
SUT SILK 1 MH (SUTURE) ×6 IMPLANT
SUT STEEL 6MS V (SUTURE) IMPLANT
SUT STEEL STERNAL CCS#1 18IN (SUTURE) IMPLANT
SUT STEEL SZ 6 DBL 3X14 BALL (SUTURE) ×9 IMPLANT
SUT VIC AB 1 CTX 36 (SUTURE)
SUT VIC AB 1 CTX36XBRD ANBCTR (SUTURE) IMPLANT
SUT VIC AB 2-0 CT1 27 (SUTURE) ×4
SUT VIC AB 2-0 CT1 TAPERPNT 27 (SUTURE) ×8 IMPLANT
SUT VIC AB 2-0 CTX 27 (SUTURE) IMPLANT
SUT VIC AB 3-0 SH 27 (SUTURE)
SUT VIC AB 3-0 SH 27X BRD (SUTURE) IMPLANT
SUT VIC AB 3-0 X1 27 (SUTURE) IMPLANT
SUT VICRYL 4-0 PS2 18IN ABS (SUTURE) IMPLANT
SYSTEM SAHARA CHEST DRAIN ATS (WOUND CARE) ×3 IMPLANT
TAPE CLOTH SURG 4X10 WHT LF (GAUZE/BANDAGES/DRESSINGS) ×6 IMPLANT
TAPE PAPER 2X10 WHT MICROPORE (GAUZE/BANDAGES/DRESSINGS) ×3 IMPLANT
TOWEL GREEN STERILE (TOWEL DISPOSABLE) ×3 IMPLANT
TOWEL GREEN STERILE FF (TOWEL DISPOSABLE) ×3 IMPLANT
TRAY FOLEY SLVR 16FR TEMP STAT (SET/KITS/TRAYS/PACK) ×3 IMPLANT
TUBING INSUFFLATION (TUBING) ×3 IMPLANT
UNDERPAD 30X30 (UNDERPADS AND DIAPERS) ×3 IMPLANT
WATER STERILE IRR 1000ML POUR (IV SOLUTION) ×6 IMPLANT

## 2018-03-22 SURGICAL SUPPLY — 20 items
BALLN IABP SENSA PLUS 8F 50CC (BALLOONS) ×2
BALLN SAPPHIRE 2.5X15 (BALLOONS) ×2
BALLOON IABP SENS PLUS 8F 50CC (BALLOONS) ×1 IMPLANT
BALLOON SAPPHIRE 2.5X15 (BALLOONS) ×1 IMPLANT
CATH 5FR JL3.5 JR4 ANG PIG MP (CATHETERS) ×2 IMPLANT
CATH VISTA GUIDE 6FR JR4 (CATHETERS) ×2 IMPLANT
ELECT DEFIB PAD ADLT CADENCE (PAD) ×2 IMPLANT
GLIDESHEATH SLEND SS 6F .021 (SHEATH) ×2 IMPLANT
GUIDEWIRE INQWIRE 1.5J.035X260 (WIRE) ×1 IMPLANT
INQWIRE 1.5J .035X260CM (WIRE) ×2
KIT ENCORE 26 ADVANTAGE (KITS) ×2 IMPLANT
KIT HEART LEFT (KITS) ×2 IMPLANT
KIT MICROPUNCTURE NIT STIFF (SHEATH) ×2 IMPLANT
PACK CARDIAC CATHETERIZATION (CUSTOM PROCEDURE TRAY) ×2 IMPLANT
SHEATH PINNACLE 5F 10CM (SHEATH) ×2 IMPLANT
SHEATH PROBE COVER 6X72 (BAG) ×2 IMPLANT
TRANSDUCER W/STOPCOCK (MISCELLANEOUS) ×2 IMPLANT
TUBING CIL FLEX 10 FLL-RA (TUBING) ×2 IMPLANT
WIRE HI TORQ BMW 190CM (WIRE) ×2 IMPLANT
WIRE RUNTHROUGH .014X180CM (WIRE) ×2 IMPLANT

## 2018-03-22 NOTE — Transfer of Care (Signed)
Immediate Anesthesia Transfer of Care Note  Patient: David [name]  Procedure(s) Performed: CORONARY ARTERY BYPASS GRAFTING (CABG) x 3. ENDOSCOPIC HARVESTING OF RIGHT GREATER SPAPHENOUS VEIN AND OPEN HARVESTING OF LEFT GREATER SAPHENOUS VEIN. BILATERAL IMA (N/A Chest) TRANSESOPHAGEAL ECHOCARDIOGRAM (TEE) (N/A )  Patient Location: SICU  Anesthesia Type:General  Level of Consciousness: sedated and Patient remains intubated per anesthesia plan  Airway & Oxygen Therapy: Patient remains intubated per anesthesia plan and Patient placed on Ventilator (see vital sign flow sheet for setting)  Post-op Assessment: Report given to RN and Post -op Vital signs reviewed and stable  Post vital signs: Reviewed and stable  Last Vitals:  Vitals Value Taken Time  BP    Temp 37.3 C 03/22/2018  9:14 PM  Pulse 111 03/22/2018  9:14 PM  Resp 11 03/22/2018  9:14 PM  SpO2 99 % 03/22/2018  9:14 PM  Vitals shown include unvalidated device data.  Last Pain:  Vitals:   03/22/18 1209  PainSc: 1          Complications: No apparent anesthesia complications   Dr. Cornelius Moras at bedside upon arrival.  Neo increased to 58mcg/min for BP 85-90/55-60.  Albumin started by receiving nurse per Dr. Barry Dienes order.

## 2018-03-22 NOTE — Progress Notes (Signed)
  Echocardiogram 2D Echocardiogram has been performed.  Gerda Diss 03/22/2018, 2:56 PM

## 2018-03-22 NOTE — Progress Notes (Deleted)
*  PRELIMINARY RESULTS* Echocardiogram Echocardiogram Transesophageal has been performed.  Gerda Diss 03/22/2018, 2:56 PM

## 2018-03-22 NOTE — Anesthesia Postprocedure Evaluation (Signed)
Anesthesia Post Note  Patient: David [name]  Procedure(s) Performed: CORONARY ARTERY BYPASS GRAFTING (CABG) x 3. ENDOSCOPIC HARVESTING OF RIGHT GREATER SPAPHENOUS VEIN AND OPEN HARVESTING OF LEFT GREATER SAPHENOUS VEIN. BILATERAL IMA (N/A Chest) TRANSESOPHAGEAL ECHOCARDIOGRAM (TEE) (N/A )     Patient location during evaluation: SICU Anesthesia Type: General Level of consciousness: sedated Pain management: pain level controlled Vital Signs Assessment: post-procedure vital signs reviewed and stable Respiratory status: patient remains intubated per anesthesia plan Cardiovascular status: stable Postop Assessment: no apparent nausea or vomiting Anesthetic complications: no    Last Vitals:  Vitals:   03/22/18 1113 03/22/18 2100  BP:    Pulse: 88   Resp: (!) 9 12  SpO2: (!) 89%     Last Pain:  Vitals:   03/22/18 1209  PainSc: 1                  Kennieth Rad

## 2018-03-22 NOTE — Progress Notes (Signed)
[facility] BRIEF SICU PROGRESS NOTE  Day of Surgery  S/P Procedure(s) (LRB): CORONARY ARTERY BYPASS GRAFTING (CABG) x 3. ENDOSCOPIC HARVESTING OF RIGHT GREATER SPAPHENOUS VEIN AND OPEN HARVESTING OF LEFT GREATER SAPHENOUS VEIN. BILATERAL IMA (N/A) TRANSESOPHAGEAL ECHOCARDIOGRAM (TEE) (N/A)   Sedated on vent NSR w/ stable hemodynamics Chest tube output low UOP excellent Labs pending IABP too low in aorta >> repositioned at bedside  Plan: Continue routine early postop.  D/C radial sheath.  Purcell Nails, MD 03/22/2018 9:28 PM

## 2018-03-22 NOTE — Progress Notes (Signed)
   03/22/18 1100  Clinical Encounter Type  Visited With Family  Visit Type Initial;Code  Referral From Other (Comment) (code STEMI)   Called ED bridge prior to expected arrival of pt to ck, pt had already arrived and was in Cath lab.    Spoke w/ ED waiting rm, pt's family there, I went down to accompany to [place], but they had gone up shortly after I called.  Met w/ pt's 2 daughters in [place] waiting, confirmed that Cath lab knew they were there, let them know that chaplain may be requested by asking staff.  Myra Gianotti resident, [phone]

## 2018-03-22 NOTE — Brief Op Note (Addendum)
03/22/2018  8:11 PM  PATIENT:  David [name]  77 y.o. male  PRE-OPERATIVE DIAGNOSIS:  1. S/p STEMI 2.CAD  POST-OPERATIVE DIAGNOSIS: 1. S/p STEMI 2.CAD  PROCEDURE:  TRANSESOPHAGEAL ECHOCARDIOGRAM (TEE), EMERGENT CORONARY ARTERY BYPASS GRAFTING (CABG) x 3 (LIMA to LAD, FREE RIMA to OM, SVG to PDA) with  ENDOSCOPIC HARVESTING OF RIGHT GREATER SPAPHENOUS VEIN AND OPEN HARVESTING OF LEFT GREATER SAPHENOUS VEIN and  BILATERAL IMA s  SURGEON:  Surgeon(s) and Role:    Purcell Nails, MD - Primary  PHYSICIAN ASSISTANT: Doree Fudge PA-C  ASSISTANTS:Diana Domenica Fail RNFA  ANESTHESIA:   general  EBL:  1900   DRAINS: Chest tubes placed in the mediastinal and pleural spaces   COUNTS CORRECT:  YES  DICTATION: .Dragon Dictation  PLAN OF CARE: Admit to inpatient   PATIENT DISPOSITION:  ICU - intubated and hemodynamically stable.   Delay start of Pharmacological VTE agent (>24hrs) due to surgical blood loss or risk of bleeding: yes  BASELINE WEIGHT: 96 kg

## 2018-03-22 NOTE — Anesthesia Procedure Notes (Signed)
Arterial Line Insertion Start/End10/28/2019 12:50 PM, 03/22/2018 12:54 PM Performed by: Izola Price., CRNA, CRNA  Patient location: Pre-op. Preanesthetic checklist: patient identified, IV checked, site marked, risks and benefits discussed, surgical consent, monitors and equipment checked, pre-op evaluation, timeout performed and anesthesia consent Lidocaine 1% used for infiltration Left, radial was placed Catheter size: 20 G Hand hygiene performed , maximum sterile barriers used  and Seldinger technique used Allen's test indicative of satisfactory collateral circulation Attempts: 1 Procedure performed without using ultrasound guided technique. Following insertion, dressing applied and Biopatch. Post procedure assessment: normal and unchanged  Patient tolerated the procedure well with no immediate complications.

## 2018-03-22 NOTE — Op Note (Signed)
CARDIOTHORACIC SURGERY OPERATIVE NOTE  Date of Procedure: 03/22/2018  Preoperative Diagnosis:   Severe 3-vessel Coronary Artery Disease  Acute ST-segment Elevation Myocardial Infarction  Postoperative Diagnosis: Same  Procedure:    Emergency Coronary Artery Bypass Grafting x 3   Left Internal Mammary Artery to Distal Left Anterior Descending Coronary Artery  Free Right Internal Mammary Artery to Obtuse Marginal Branch of Left Circumflex Coronary Artery  Reversed Saphenous Greater Vein Graft to Posterior Descending Coronary Artery  Endoscopic Saphenous Vein Harvest from Right Thigh and Lower Leg  Open Saphenous Vein Harvest from Left Thigh and Lower Leg  Surgeon: David [name]. David Moras, MD  Assistant: David Balls, PA-C  Anesthesia: David Bruce, MD and David Duos, MD  Operative Findings:  Moderate left ventricular systolic dysfunction with inferior wall akinesis, EF 35-David%  Good quality left and right internal mammary arteries for conduit  Poor quality saphenous vein conduit, most of which was discarded  Fair quality target vessels for grafting    BRIEF CLINICAL NOTE AND INDICATIONS FOR SURGERY  Patient is a 77 year old [name] with no previous history of coronary artery disease who has been referred for emergent surgical consultation for management of severe three-vessel coronary artery disease with ongoing acute ST segment elevation myocardial infarction.  Patient states that he does not have a primary care physician.  He reportedly had been seen on 2 previous occasions as an outpatient for episodes of chest pain and were told that his symptoms were likely caused by indigestion.  The patient reports that over the past month he has had progressive symptoms of substernal chest pain radiating to his left arm with physical exertion.  Symptoms have been associated with shortness of breath and usually brought on with walking up a hill.  He says up until this morning  symptoms always relieved by rest.  This morning he woke up per his usual and drove to his daughter's home at approximately 7:30am.  When he got out of his car and started walking up the hill he began to experience chest pain that was more severe and associated with shortness of breath.  Symptoms became quite intense and persisted, prompting his daughter to take him directly to the emergency department at [facility] in [place].  There EKG revealed diffuse ST segment elevation across the inferior leads.  The patient was transported directly to the [facility] cardiac Cath Lab where catheterization was performed by Dr. Okey Dupre.  The patient was found to have severe three-vessel coronary artery disease including acute thrombotic occlusion of the right coronary artery.  Primary balloon angioplasty of the right coronary artery was performed.  The patient's chest pain improved but did not resolve completely.  Emergent cardiothoracic surgical consultation was requested and an intra-aortic balloon pump was placed.  The patient has been seen in consultation and counseled in the cath lab regarding the indications, risks and potential benefits of surgery.  All questions have been answered, and the patient provides full informed consent for the operation as described.  Patient is brought directly to the operating room with decreased but ongoing chest pain in stable condition.    DETAILS OF THE OPERATIVE PROCEDURE  Preparation:  The patient is brought to the operating room on the above mentioned date and central monitoring was established by the anesthesia team including placement of Swan-Ganz catheter and left radial arterial line. The patient is placed in the supine position on the operating table.  Intravenous antibiotics are administered. General endotracheal anesthesia is induced uneventfully.  A Foley catheter is placed.  Baseline transesophageal echocardiogram was performed by Dr. Michelle [name], results to  be provided in a separate note.  The patient's chest, abdomen, both groins, and both lower extremities are prepared and draped in a sterile manner. A time out procedure is performed.   Surgical Approach and Conduit Harvest:  A median sternotomy incision was performed and the left internal mammary artery is dissected from the chest wall and prepared for bypass grafting. The left internal mammary artery is notably good quality conduit. Simultaneously, the greater saphenous vein is obtained from the patient's right thigh and leg using endoscopic vein harvest technique. The saphenous vein is notably poor quality conduit, and only a single segment is suitable for grafting.  Subsequently the greater saphenous vein is removed from the patient's left thigh and left lower leg using open vein harvest technique.  This vein is also notably poor quality conduit with areas that are small in caliber and other areas which are sclerotic and fibrotic.  Eventually a decision is made to dissect the right internal mammary artery from the chest wall and prepare it for bypass grafting.  After removal of the saphenous vein, the surgical incisions in the both lower extremities are closed with absorbable suture. Following systemic heparinization, the left and right internal mammary arteries were transected distally and each noted to have excellent flow.   Extracorporeal Cardiopulmonary Bypass and Myocardial Protection:  The pericardium is opened. The ascending aorta is mildly dilated and sclerotic in appearance.  There is some hard palpable plaque noted at the takeoff of the innominate artery.  The ascending aorta and the right atrium are cannulated for cardiopulmonary bypass.  Adequate heparinization is verified.    A retrograde cardioplegia cannula is placed through the right atrium into the coronary sinus.  The entire pre-bypass portion of the operation was notable for stable hemodynamics.  Cardiopulmonary bypass was begun  and the surface of the heart is inspected. Distal target vessels are selected for coronary artery bypass grafting.  The right internal mammary artery is not long enough to reach any target vessel as an in situ graft.  Subsequently the right internal mammary artery is transected proximally just after its takeoff from the subclavian artery.  The proximal stump was oversewn with suture ligatures.  A cardioplegia cannula is placed in the ascending aorta.  A temperature probe was placed in the interventricular septum.  The patient is allowed to cool passively to [facility] systemic temperature.  The aortic cross clamp is applied and cold blood cardioplegia is delivered initially in an antegrade fashion through the aortic root.  Supplemental cardioplegia is given retrograde through the coronary sinus catheter.  Iced saline slush is applied for topical hypothermia.  The initial cardioplegic arrest is rapid with early diastolic arrest.  Repeat doses of cardioplegia are administered intermittently throughout the entire cross clamp portion of the operation through the aortic root, through the coronary sinus catheter, and through subsequently placed vein grafts in order to maintain completely flat electrocardiogram and septal myocardial temperature below 15C.  Myocardial protection was felt to be excellent.  Coronary Artery Bypass Grafting:   The posterior descending branch of the right coronary artery was grafted using a reversed saphenous vein graft in an end-to-side fashion.  At the site of distal anastomosis the target vessel was fair quality and measured approximately 1.8 mm in diameter.  There is some atherosclerotic plaque in the vessel both proximal and distal to the site of distal anastomosis, but a 1.5 mm  probe will pass in both directions and all the way into the right coronary artery.  The obtuse marginal branch of the left circumflex coronary artery was grafted using the right internal mammary artery in an  end-to-side fashion.  At the site of distal anastomosis the target vessel was good quality and measured approximately 1.7 mm in diameter.  The distal left anterior coronary artery was grafted with the left internal mammary artery in an end-to-side fashion.  At the site of distal anastomosis the target vessel was fair quality and measured approximately 1.5 mm in diameter.  The left anterior descending coronary artery was chronically occluded proximally.  The single proximal vein graft anastomosis was placed directly to the ascending aorta prior to removal of the aortic cross clamp.  A second short piece of good quality saphenous vein conduit was also sewn directly to the ascending aorta in end-to-side fashion prior to removal of the aortic cross-clamp.  The septal myocardial temperature rose rapidly after reperfusion of the left internal mammary artery graft.  The aortic cross clamp was removed after a total cross clamp time of 81 minutes.  The proximal end of the right internal mammary artery graft was sewn to the short segment of greater saphenous vein as a very short interposition graft because of size mismatch between the proximal end of the arterial graft and the aorta.   Procedure Completion:  All proximal and distal coronary anastomoses were inspected for hemostasis and appropriate graft orientation. Epicardial pacing wires are fixed to the right ventricular outflow tract and to the right atrial appendage. The patient is rewarmed to 37C temperature. The patient is weaned and disconnected from cardiopulmonary bypass.  The patient's rhythm at separation from bypass was sinus.  The patient was weaned from cardiopulmonary bypass on milrinone at 0.2 mcg/kg/min with intra-aortic balloon counterpulsation at 1:1. Total cardiopulmonary bypass time for the operation was 123 minutes.  Followup transesophageal echocardiogram performed after separation from bypass revealed inferior wall hypokinesis which  improved over time.  There was mild central mitral regurgitation.  The aortic and venous cannula were removed uneventfully. Protamine was administered to reverse the anticoagulation. The mediastinum and pleural space were inspected for hemostasis and irrigated with saline solution. The mediastinum and both pleural spaces were drained using 4 chest tubes placed through separate stab incisions inferiorly.  The soft tissues anterior to the aorta were reapproximated loosely. The sternum is closed with double strength sternal wire. The soft tissues anterior to the sternum were closed in multiple layers and the skin is closed with a running subcuticular skin closure.  The post-bypass portion of the operation was notable for stable rhythm and hemodynamics.  No blood products were administered during the operation.   Disposition:  The patient tolerated the procedure well and is transported to the surgical intensive care in stable condition. There are no intraoperative complications. All sponge instrument and needle counts are verified correct at completion of the operation.    David [name]. David Moras MD 03/22/2018 8:16 PM

## 2018-03-22 NOTE — Anesthesia Preprocedure Evaluation (Signed)
Anesthesia Evaluation  Patient identified by MRN, date of birth, ID band Patient awake    Reviewed: Allergy & Precautions, NPO status , Patient's Chart, lab work & pertinent test results  Airway Mallampati: I  TM Distance: >3 FB Neck ROM: Full    Dental   Pulmonary    Pulmonary exam normal        Cardiovascular + CAD and + Past MI  Normal cardiovascular exam     Neuro/Psych    GI/Hepatic   Endo/Other    Renal/GU      Musculoskeletal   Abdominal   Peds  Hematology   Anesthesia Other Findings   Reproductive/Obstetrics                             Anesthesia Physical Anesthesia Plan  ASA: IV and emergent  Anesthesia Plan: General   Post-op Pain Management:    Induction: Intravenous  PONV Risk Score and Plan: Treatment may vary due to age or medical condition  Airway Management Planned: Oral ETT  Additional Equipment: Arterial line, CVP, PA Cath, TEE and Ultrasound Guidance Line Placement  Intra-op Plan:   Post-operative Plan: Post-operative intubation/ventilation  Informed Consent: I have reviewed the patients History and Physical, chart, labs and discussed the procedure including the risks, benefits and alternatives for the proposed anesthesia with the patient or authorized representative who has indicated his/her understanding and acceptance.     Plan Discussed with: CRNA and Surgeon  Anesthesia Plan Comments:         Anesthesia Quick Evaluation

## 2018-03-22 NOTE — Progress Notes (Signed)
Called to 2h11 for radial sheath removal. 6Fr radial sheath in right radial artery. Right hand mottled and slightly cold. Patient arm band removed and a new one placed on left arm. 2fr sheath aspirated and removed from right radial artery. TR band applied and adjusted to 10cc air. No bleeding or hematoma present. Spo2 98% as measured in right hand. Patient sedated and intubated.  Band applied at 22:25:00 @ 10 cc

## 2018-03-22 NOTE — Consult Note (Signed)
[address]       [place] [address]             [phone]          CARDIOTHORACIC SURGERY CONSULTATION REPORT  PCP is No primary care provider on file. Referring Provider is End, Harrell Gave, MD  Reason for consultation:  Severe 3-vessel CAD w/ ongoing acute ST-segment elevation myocardial infarction  HPI:  Patient is a 77 year old male with no previous history of coronary artery disease who has been referred for emergent surgical consultation for management of severe three-vessel coronary artery disease with ongoing acute ST segment elevation myocardial infarction.  Patient states that he does not have a primary care physician.  He reportedly had been seen on 2 previous occasions as an outpatient for episodes of chest pain and were told that his symptoms were likely caused by indigestion.  The patient reports that over the past month he has had progressive symptoms of substernal chest pain radiating to his left arm with physical exertion.  Symptoms have been associated with shortness of breath and usually brought on with walking up a hill.  He says up until this morning symptoms always relieved by rest.  This morning he woke up per his usual and drove to his daughter's home at approximately 7:30am.  When he got out of his car and started walking up the hill he began to experience chest pain that was more severe and associated with shortness of breath.  Symptoms became quite intense and persisted, prompting his daughter to take him directly to the emergency department at [facility] in [place].  There EKG revealed diffuse ST segment elevation across the inferior leads.  The patient was transported directly to the [facility] cardiac Cath Lab where catheterization was performed by Dr. Saunders Revel.  The patient was found to have severe three-vessel coronary artery disease including acute thrombotic occlusion of the right coronary artery.  Primary balloon  angioplasty of the right coronary artery was performed.  The patient's chest pain improved but did not resolve completely.  Emergent cardiothoracic surgical consultation was requested and an intra-aortic balloon pump was placed.  The patient lives alone in [place].  He is retired but he lives on [address] and remains quite active physically, tending to his property.  Up until presently the patient denies any significant physical limitations.  He describes symptoms consistent with classical angina pectoris up until he developed severe chest pain this morning.  He reports some mild exertional shortness of breath.  He denies resting shortness of breath, PND, orthopnea, or lower extremity edema.  He has not had palpitations, dizzy spells, nor syncope.  He does not have a primary care physician and he has not seen a physician for many years.  No past medical history on file.  No family history on file.  Social History   Socioeconomic History  . Marital status: Not on file    Spouse name: Not on file  . Number of children: Not on file  . Years of education: Not on file  . Highest education level: Not on file  Occupational History  . Not on file  Social Needs  . Financial resource strain: Not on file  . Food insecurity:    Worry: Not on file    Inability: Not on file  . Transportation needs:    Medical: Not on file    Non-medical: Not on file  Tobacco Use  . Smoking status:  Not on file  Substance and Sexual Activity  . Alcohol use: Not on file  . Drug use: Not on file  . Sexual activity: Not on file  Lifestyle  . Physical activity:    Days per week: Not on file    Minutes per session: Not on file  . Stress: Not on file  Relationships  . Social connections:    Talks on phone: Not on file    Gets together: Not on file    Attends religious service: Not on file    Active member of club or organization: Not on file    Attends meetings of clubs or organizations: Not on file     Relationship status: Not on file  . Intimate partner violence:    Fear of current or ex partner: Not on file    Emotionally abused: Not on file    Physically abused: Not on file    Forced sexual activity: Not on file  Other Topics Concern  . Not on file  Social History Narrative  . Not on file    Prior to Admission medications   Not on File    Current Facility-Administered Medications  Medication Dose Route Frequency Provider Last Rate Last Dose  . atropine 1 MG/10ML injection    PRN End, Harrell Gave, MD   0.5 mg at 03/22/18 0958  . cangrelor [facility]) 50,000 mcg in sodium chloride 0.9 % 250 mL (200 mcg/mL) infusion    Continuous PRN End, Christopher, MD 115.2 mL/hr at 03/22/18 0957 4 mcg/kg/min at 03/22/18 0957  . cangrelor [place]) bolus via infusion    PRN End, Harrell Gave, MD   2,880 mcg at 03/22/18 0955  . cefUROXime (ZINACEF) 1.5 g in sodium chloride 0.9 % 100 mL IVPB  1.5 g Intravenous To OR Rexene Alberts, MD      . dexmedetomidine (PRECEDEX) 400 MCG/100ML (4 mcg/mL) infusion  0.1-0.7 mcg/kg/hr Intravenous To OR Rexene Alberts, MD      . DOPamine (INTROPIN) 800 mg in dextrose 5 % 250 mL (3.2 mg/mL) infusion  0-10 mcg/kg/min Intravenous To OR Rexene Alberts, MD      . EPINEPHrine (ADRENALIN) 4 mg in dextrose 5 % 250 mL (0.016 mg/mL) infusion  0-10 mcg/min Intravenous To OR Rexene Alberts, MD      . heparin 30,000 units/NS 1000 mL solution for CELLSAVER   Other To OR Rexene Alberts, MD      . heparin injection    PRN Nelva Bush, MD   2,000 Units at 03/22/18 1011  . insulin regular, human (MYXREDLIN) 100 units/ 100 mL infusion   Intravenous To OR Rexene Alberts, MD      . lidocaine (PF) (XYLOCAINE) 1 % injection    PRN End, Harrell Gave, MD   20 mL at 03/22/18 1016  . magnesium sulfate (IV Push/IM) injection 40 mEq  40 mEq Other To OR Rexene Alberts, MD      . milrinone (PRIMACOR) 20 MG/100 ML (0.2 mg/mL) infusion  0.3 mcg/kg/min Intravenous To OR Rexene Alberts, MD      . nitroGLYCERIN 50 mg in dextrose 5 % 250 mL (0.2 mg/mL) infusion  2-200 mcg/min Intravenous To OR Rexene Alberts, MD      . norepinephrine (LEVOPHED) 9m in D5W 2542mpremix infusion  0-40 mcg/min Intravenous Titrated OwRexene AlbertsMD      . phenylephrine (NEOSYNEPHRINE) 20-0.9 MG/250ML-% infusion  30-200 mcg/min Intravenous To OR OwRexene AlbertsMD      .  potassium chloride injection 80 mEq  80 mEq Other To OR Rexene Alberts, MD      . tranexamic acid (CYKLOKAPRON) 2,500 mg in sodium chloride 0.9 % 250 mL (10 mg/mL) infusion  1.5 mg/kg/hr Intravenous To OR Rexene Alberts, MD      . tranexamic acid (CYKLOKAPRON) bolus via infusion - over 30 minutes 1,440 mg  15 mg/kg Intravenous To OR Rexene Alberts, MD      . tranexamic acid (CYKLOKAPRON) pump prime solution 192 mg  2 mg/kg Intracatheter To OR Rexene Alberts, MD      . vancomycin (VANCOCIN) 1,500 mg in sodium chloride 0.9 % 250 mL IVPB  1,500 mg Intravenous To OR Rexene Alberts, MD       Facility-Administered Medications Ordered in Other Encounters  Medication Dose Route Frequency Provider Last Rate Last Dose  . cefUROXime (ZINACEF) 750 mg in sodium chloride 0.9 % 100 mL IVPB  750 mg Intravenous To OR Rexene Alberts, MD      . heparin 2,500 Units, papaverine 30 mg in electrolyte-148 (PLASMALYTE-148) 500 mL irrigation   Irrigation To OR Rexene Alberts, MD      . vancomycin (VANCOCIN) 1,000 mg in sodium chloride 0.9 % 1,000 mL irrigation   Irrigation To OR Rexene Alberts, MD        No Known Allergies    Review of Systems:   General:  normal appetite, normal energy, no weight gain, no weight loss, no fever  Cardiac:  + chest pain with exertion, + chest pain at rest, +SOB with exertion, no resting SOB, no PND, no orthopnea, no palpitations, no arrhythmia, no atrial fibrillation, no LE edema, no dizzy spells, no syncope  Respiratory:  no shortness of breath, no home oxygen, no productive cough, + dry  cough, no bronchitis, no wheezing, no hemoptysis, no asthma, no pain with inspiration or cough, no sleep apnea, no CPAP at night  GI:   no difficulty swallowing, no reflux, no frequent heartburn, no hiatal hernia, no abdominal pain, no constipation, no diarrhea, no hematochezia, no hematemesis, no melena  GU:   no dysuria,  no frequency, no urinary tract infection, no hematuria, no enlarged prostate, no kidney stones, no kidney disease  Vascular:  no pain suggestive of claudication, no pain in feet, no leg cramps, no varicose veins, no DVT, no non-healing foot ulcer  Neuro:   no stroke, no TIA's, no seizures, no headaches, no temporary blindness one eye,  no slurred speech, no peripheral neuropathy, no chronic pain, no instability of gait, no memory/cognitive dysfunction  Musculoskeletal: + 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803 m)   Wt 96 kg   SpO2 97%   BMI 29.52 kg/m   General:  Elderly male in NAD but still having 2/10 SSCP   HEENT:  Unremarkable   Neck:   no JVD, no bruits, no adenopathy   Chest:   clear to auscultation, symmetrical breath sounds, no wheezes, no rhonchi   CV:   RRR, no  murmur   Abdomen:  soft, non-tender, no masses   Extremities:  warm, well-perfused, pulses diminished but palpable, no lower extremity edema  Rectal/GU  Deferred  Neuro:   Grossly  non-focal and symmetrical throughout  Skin:   Clean and dry, no rashes, no breakdown  Diagnostic Tests:  Lab Results: No results for input(s): WBC, HGB, HCT, PLT in the last 72 hours. BMET: No results for input(s): NA, K, CL, CO2, GLUCOSE, BUN, CREATININE, CALCIUM in the last 72 hours.  CBG (last 3)  No results for input(s): GLUCAP in the last 72 hours. PT/INR:  No results for input(s): LABPROT, INR in the last 72 hours.  CXR:  N/A   Coronary/Graft Acute MI Revascularization  IABP Insertion  LEFT HEART CATH AND CORONARY ANGIOGRAPHY  Conclusion   Conclusions: 1. Severe three-vessel CAD, as detailed below.  Culprit lesion is acute plaque rupture and thrombotic subtotal occlusion of the mid RCA with TIMI-1 flow.. 2. Moderately elevated left ventricular filling pressure with LVEF of 35 to 45%.  There is inferior akinesis. 3. Successful PTCA of the mid RCA using a 2.5 x 15 mm balloon with reduction of stenosis from 99% to 30% and restoration of TIMI-3 flow. 4. Successful placement of 50 mL intra-aortic balloon pump via the right common femoral artery.  Recommendations: 1. Patient to go to the operating room for emergent CABG with Dr. Roxy Manns.  We will continue Cangrelor until he proceeds to the operating room. 2. Aggressive secondary prevention, including high intensity statin therapy.  Recommend uninterrupted dual antiplatelet therapy with Aspirin 76m daily and Clopidogrel 775mdaily for a minimum of 12 months (ACS - Class I recommendation).  Initiation of clopidogrel will be deferred until after CABG.  ChNelva BushMD [facility] Pager: [phone] Indications   ST elevation myocardial infarction involving right coronary artery ([place]I21.11 (ICD-10-CM)]  Procedural Details/Technique   Technical Details Indication: [age].o. year-old man with history of degenerative disc disease of the cervical spine[facility]  at approximately 730 this morning. He had noted some vague discomfort over the last few days but noted that the pain became acutely worse this morning. He was found to have inferior ST segment elevation, prompting emergent transfer for left heart cath and possible PCI. [facility] 5/10 substernal chest pain without other symptoms.  GFR: >60 ml/min  Procedure: The risks, benefits, complications, treatment options, and expected outcomes were discussed with the patient. The patient provided emergent verbal consent. The right wrist was assessed with a modified Allens test which was normal. The right wrist was prepped and draped in a sterile fashion. 1% lidocaine was used for local anesthesia. Using the modified Seldinger access technique, a 449F slender Glidesheath was placed in the right radial artery. 3 mg Verapamil was given through the sheath. Heparin 5,000 units were administered.  Selective coronary angiography was performed using 49F JL 3.5 and 449F JR4 guide catheters to engage the left and right coronary arteries, respectively. Left heart catheterization was performed using a 49F pigtail catheter via the groin after PTCA of the mid RCA. Left ventriculogram was performed with a hand injection of contrast.  PCI to RCA Heparin was used for anticoagulation.  The patient was placed on a bolus and infusion of cangrelor. A Runthrough wire was advanced into the distal RCA. The thrombotic subtotal occlusion involving the mid RCA was dilated using a Sapphire 2.5 x 15 mm balloon at 8 atm. There was reduction in the stenosis to 30% with TIMI-3 flow. I then attempted to wire the PDA with the Runthrough wire as well as a BMW wire but could not access the vessel. Images were reviewed with Dr. Gwenlyn Found. Given severe three-vessel CAD, the decision was made to consult cardiac surgery for emergent bypass. The patient was seen by Dr. Roxy Manns who plans to take the patient to the OR  shortly.  Intra-aortic balloon pump placement Given continued chest pain and three-vessel disease, decision was made to place intra-aortic balloon pump. Ultrasound was used to evaluate the common femoral artery. It was patent. An ultrasound image was saved in the permanent record. A micropuncture needle was used to access the right common femoral artery under ultrasound guidance. A 486F sheath was placed in the right common femoral artery using modified Seldinger technique. Left heart catheterization was performed with a 486F angled pigtail catheter. Left ventriculogram was performed with a hand-injection of contrast. The 486F sheath was then exchanged over a 0.035" J-wire for an 86F IABP sheath. A 50 mL IABP was placed in the descending aorta.  The radial and femoral sheaths were secured in place. Patient was continued on ticagrelor as well as boluses of IV heparin to maintain ACT greater than 200. There were no immediate complications.   Contrast used: 90 mL Isovue Fluoroscopy time: 8.7 min Radiation dose: 701 mGy   Estimated blood loss <50 mL.  During this procedure no sedation was administered.  Complications   No complications were associated with this study.  Documented by Nelva Bush, MD - 03/22/2018 11:06 AM EDT    Coronary Findings   Diagnostic  Dominance: Right  Left Main  Dist LM lesion 40% stenosed  Dist LM lesion is 40% stenosed.  Left Anterior Descending  Vessel is large.  Ost LAD to Prox LAD lesion 95% stenosed  Ost LAD to Prox LAD lesion is 95% stenosed. The lesion is severely calcified.  Prox LAD lesion 100% stenosed  Prox LAD lesion is 100% stenosed. The lesion is chronically occluded.  First Diagonal Branch  Vessel is moderate in size.  1st Diag lesion 80% stenosed  1st Diag lesion is 80% stenosed.  Second Diagonal Branch  Collaterals  2nd Diag filled by collaterals from 1st Diag.    Second Septal Branch  Collaterals  2nd Sept filled by collaterals from  RPDA.    Left Circumflex  Vessel is large.  Prox Cx lesion 80% stenosed  Prox Cx lesion is 80% stenosed.  First Obtuse Marginal Branch  Vessel is moderate in size.  Ost 1st Mrg lesion 90% stenosed  Ost 1st Mrg lesion is 90% stenosed.  Second Obtuse Marginal Branch  Vessel is large in size.  Third Obtuse Marginal Branch  Vessel is small in size.  Right Coronary Artery  Vessel is large.  Mid RCA lesion 99% stenosed  Mid RCA lesion is 99% stenosed. The lesion is heavily thrombotic.  Dist RCA lesion 80% stenosed  Dist RCA lesion is 80% stenosed. The lesion is eccentric.  Right Posterior Atrioventricular Branch  Collaterals  Post Atrio filled by collaterals from 1st Mrg.    Intervention   Mid RCA lesion  Angioplasty  Balloon angioplasty was performed.  Post-Intervention Lesion Assessment  The intervention was  successful. Pre-interventional TIMI flow is 1. Post-intervention TIMI flow is 3. No complications occurred at this lesion.  There is a 30% residual stenosis post intervention.  Impella/IABP   Hemodynamic Support An IABP was inserted post-PCI. Access site: right femoral artery.  Wall Motion   Resting               Left Heart   Left Ventricle LV end diastolic pressure is moderately elevated. The left ventricular ejection fraction is 35-45% by visual estimate.  Aortic Valve There is no aortic valve stenosis.  Coronary Diagrams   Diagnostic Diagram       Post-Intervention Diagram       Implants    No implant documentation for this case.  MERGE Images   Show images for CARDIAC CATHETERIZATION   Link to Procedure Log   Procedure Log    Hemo Data    Most Recent Value  AO Systolic Pressure 119 mmHg  AO Diastolic Pressure 67 mmHg  AO Mean 83 mmHg  LV Systolic Pressure 147 mmHg  LV Diastolic Pressure 10 mmHg  LV EDP 24 mmHg  AOp Systolic Pressure 829 mmHg  AOp Diastolic Pressure 74 mmHg  AOp Mean Pressure 93 mmHg  LVp Systolic Pressure 562 mmHg   LVp Diastolic Pressure 13 mmHg  LVp EDP Pressure 25 mmHg      Impression:  Patient has severe three-vessel coronary artery disease and has presented with acute inferior wall ST segment elevation myocardial infarction.  The patient's chest pain has improved but not resolved completely following primary balloon angioplasty of the culprit right coronary artery.  I have personally reviewed the patient's diagnostic cardiac catheterization and PCI films with Dr. Saunders Revel in the Cath Lab.  Patient has severe three-vessel coronary artery disease with 100% acute thrombotic occlusion of the proximal right coronary artery, 95% proximal stenosis followed by 100% chronic occlusion of the mid left anterior descending coronary artery with left to left and right to left collateral filling of the terminal branches of left anterior descending coronary artery, and 80% stenosis of the left circumflex coronary artery.   The right coronary artery was opened with primary balloon angioplasty reestablishing TIMI-3 flow in the right coronary artery.  Left ventricular function appears acutely depressed in the inferior wall but the remainder of the left ventricle appears to move reasonably well and left ventricular end-diastolic pressure is estimated 24 mmHg.  Although the patient appears quite stable following angioplasty he still has ongoing symptoms of chest pain with residual critical coronary artery disease in the left coronary distribution.  I agree the patient would best be treated with prompt surgical revascularization.  Plan:  I have discussed the nature of the patient's current problem with the patient while he remains on the Cath Lab table.  I have also spoken with his family in the waiting room.  We discussed treatment options including our primary recommendation that the patient be taken directly to the operating room for emergent coronary artery bypass grafting.  The patient understands and accepts all potential  associated risks of surgery including but not limited to risk of death, stroke or other neurologic complication, myocardial infarction, congestive heart failure, respiratory failure, renal failure, bleeding requiring blood transfusion and/or reexploration, aortic dissection or other major vascular complication, arrhythmia, heart block or bradycardia requiring permanent pacemaker, pneumonia, pleural effusion, wound infection, pulmonary embolus or other thromboembolic complication, chronic pain or other delayed complications related to median sternotomy, or the late recurrence of symptomatic ischemic heart disease and/or congestive  heart failure.  The importance of long term risk modification have been emphasized.  All questions answered.  We plan to proceed directly to the operating room for emergent coronary artery bypass grafting.     I spent in excess of 60 minutes during the conduct of this hospital consultation and >50% of this time involved direct face-to-face encounter for counseling and/or coordination of the patient's care.    Valentina Gu. Roxy Manns, MD 03/22/2018 10:41 AM

## 2018-03-22 NOTE — Anesthesia Procedure Notes (Signed)
Procedure Name: Intubation Date/Time: 03/22/2018 1:20 PM Performed by: Oletta Lamas, CRNA Pre-anesthesia Checklist: Patient identified, Emergency Drugs available, Suction available and Patient being monitored Patient Re-evaluated:Patient Re-evaluated prior to induction Oxygen Delivery Method: Circle System Utilized Preoxygenation: Pre-oxygenation with 100% oxygen Induction Type: IV induction Ventilation: Mask ventilation without difficulty and Mask ventilation with difficulty Laryngoscope Size: Mac and 4 Grade View: Grade I Tube type: Oral Tube size: 7.5 mm Number of attempts: 1 Airway Equipment and Method: Stylet Placement Confirmation: ETT inserted through vocal cords under direct vision,  positive ETCO2 and breath sounds checked- equal and bilateral Secured at: 22 cm Tube secured with: Tape Dental Injury: Teeth and Oropharynx as per pre-operative assessment

## 2018-03-22 NOTE — H&P (Signed)
Cardiology Admission History and Physical:   Patient ID: David [name] MRN: [id number]; DOB: [birth year]   Admission date: 03/22/2018  Primary Care Provider: No primary care provider on file. Primary Cardiologist: New - Rayette Mogg Primary Electrophysiologist:  None   Chief Complaint:  Chest pain  Patient Profile:   David [name] is a 77 y.o. male with degenerative disc disease of the cervical spine, transferred from [facility] due to acute onset of chest pain this morning and inferior ST segment elevation.  History of Present Illness:   David [name] has noted some intermittent chest pain over the last several months.  He has been seen twice at an outside emergency department and diagnosed with noncardiac chest pain.  For the last few days, he has had vague chest discomfort but did not seek further treatment.  This morning, at approximately 730, he had acute onset of significant substernal chest pain without radiation or accompanying symptoms such as shortness of breath, nausea, and diaphoresis.  He presented to [facility] rocking him where he was found to have inferior ST segment elevation.  He was referred to [facility] for emergent left heart catheterization and possible PCI.  Upon arrival at [facility], he continued to have 5/10 chest pain after having received aspirin 324 mg and heparin 5000 units.  Nitroglycerin was not administered due to inferior MI and soft blood pressure.  Cardiac catheterization revealed severe three-vessel coronary artery disease; culprit lesion was acute plaque rupture and thrombotic subtotal occlusion of the mid RCA.  Patient underwent successful balloon angioplasty with improvement in stenosis and TIMI-3 flow.   History reviewed. No pertinent past medical history.  Past Surgical History:  Procedure Laterality Date  . HERNIA REPAIR       Medications Prior to Admission: Prior to Admission medications   Not on File     Allergies:   No Known  Allergies  Social History:   Remote smoking history, having quit at age 76.  No alcohol or illicit drug use.   Family History:   No family history of heart disease.  ROS:  Unable to obtain due to critical illness.  Physical Exam/Data:   Vitals:   03/22/18 1100 03/22/18 1103 03/22/18 1108 03/22/18 1113  BP: 138/88 138/88 134/65   Pulse:  78 89 88  Resp:  14 11 (!) 9  SpO2:  100% 100% (!) 89%  Weight:      Height:       No intake or output data in the 24 hours ending 03/22/18 1121 Filed Weights   03/22/18 0948  Weight: 96 kg   Body mass index is 29.52 kg/m.  General: Overweight man, lying on stretcher.  He appears comfortable. HEENT: normal Lymph: no adenopathy Neck: no JVD Endocrine:  No thryomegaly Vascular: No carotid bruits; 2+ radial and femoral pulses bilaterally. Cardiac: Regular rate and rhythm without murmurs or rubs. Lungs: Clear anteriorly without wheezes or crackles. Abd: soft, nontender, no hepatomegaly  Ext: no extremity edema Musculoskeletal:  No deformities, BUE and BLE strength normal and equal Skin: warm and dry  Neuro:  CNs 3-12 intact, no focal abnormalities noted Psych:  Normal affect    EKG:  The ECG that was done at [facility] at 8:17 a.m. (personally reviewed) shows sinus bradycardia with inferior ST elevation and lateral T wave inversions and ST depressions.  Relevant CV Studies: LHC/PCI (03/22/18): 1. Severe three-vessel CAD, as detailed below.  Culprit lesion is acute plaque rupture and thrombotic subtotal occlusion of the mid RCA with  TIMI-1 flow.. 2. Moderately elevated left ventricular filling pressure with LVEF of 35 to 45%.  There is inferior akinesis. 3. Successful PTCA of the mid RCA using a 2.5 x 15 mm balloon with reduction of stenosis from 99% to 30% and restoration of TIMI-3 flow. 4. Successful placement of 50 mL intra-aortic balloon pump via the right common femoral artery.  Diagnostic Diagram       Post-Intervention  Diagram          Laboratory Data:  ChemistryNo results for input(s): NA, K, CL, CO2, GLUCOSE, BUN, CREATININE, CALCIUM, GFRNONAA, GFRAA, ANIONGAP in the last 168 hours.  No results for input(s): PROT, ALBUMIN, AST, ALT, ALKPHOS, BILITOT in the last 168 hours. Hematology Recent Labs  Lab 03/22/18 1033  WBC 11.9*  RBC 4.78  HGB 14.7  HCT 44.3  MCV 92.7  MCH 30.8  MCHC 33.2  RDW 12.5  PLT 222   Cardiac EnzymesNo results for input(s): TROPONINI in the last 168 hours. No results for input(s): TROPIPOC in the last 168 hours.  BNPNo results for input(s): BNP, PROBNP in the last 168 hours.  DDimer No results for input(s): DDIMER in the last 168 hours.  Radiology/Studies:  No results found.  Assessment and Plan:   Coronary artery disease with inferior STEMI Patient presented with acute onset of chest pain this morning and was found to have inferior ST elevation at outside hospital.  Cath here shows severe three-vessel disease; culprit is thrombotic 99% stenosis of the mid RCA.  This was successfully angioplastied with restoration of TIMI-3 flow.  Given heavy disease burden, decision was made to refer for emergent CABG with Dr. Cornelius Moras.  Intra-aortic balloon pump placed due to ongoing chest pain and moderately elevated LVEDP.  Continue cangrelor and heparin boluses to maintain ACT greater than 200 while awaiting transfer to OR.  Aggressive secondary prevention including high intensity statin therapy following CABG.  Continue intra-aortic balloon pump at 1:1.  Ischemic cardiomyopathy LVEF moderately reduced with moderately elevated LVEDP.  Continue intra-aortic balloon pump at 1:1.  Beta-blocker and ACE inhibitor, as tolerated, following CABG.  Severity of Illness: The appropriate patient status for this patient is INPATIENT. Inpatient status is judged to be reasonable and necessary in order to provide the required intensity of service to ensure the patient's safety. The  patient's presenting symptoms, physical exam findings, and initial radiographic and laboratory data in the context of their chronic comorbidities is felt to place them at high risk for further clinical deterioration. Furthermore, it is not anticipated that the patient will be medically stable for discharge from the hospital within 2 midnights of admission. The following factors support the patient status of inpatient.   " The patient's presenting symptoms include chest pain. " The worrisome physical exam findings include bradycardia. " The initial radiographic and laboratory data are worrisome because of ST elevation on EKG and severe 3-vessel coronary artery disease by catheterization. " The chronic co-morbidities include None.  * I certify that at the point of admission it is my clinical judgment that the patient will require inpatient hospital care spanning beyond 2 midnights from the point of admission due to high intensity of service, high risk for further deterioration and high frequency of surveillance required.*    For questions or updates, please contact [facility] Please consult [email or web address] for contact info under     Signed, Yvonne Kendall, MD  03/22/2018 11:21 AM

## 2018-03-22 NOTE — Anesthesia Procedure Notes (Signed)
Central Venous Catheter Insertion Performed by: Arta Bruce, MD, anesthesiologist Start/End10/28/2019 12:55 PM, 03/22/2018 1:05 PM Patient location: Pre-op. Preanesthetic checklist: patient identified, IV checked, risks and benefits discussed, surgical consent, monitors and equipment checked, pre-op evaluation, timeout performed and anesthesia consent Position: Trendelenburg Lidocaine 1% used for infiltration and patient sedated Hand hygiene performed , maximum sterile barriers used  and Seldinger technique used Catheter size: 8.5 Fr Total catheter length 10. Central line and PA cath was placed.Sheath introducer Swan type:thermodilution PA Cath depth:50 Procedure performed using ultrasound guided technique. Ultrasound Notes:anatomy identified, needle tip was noted to be adjacent to the nerve/plexus identified, no ultrasound evidence of intravascular and/or intraneural injection and image(s) printed for medical record Attempts: 1 Following insertion, line sutured, dressing applied and Biopatch. Post procedure assessment: blood return through all ports, free fluid flow and no air  Patient tolerated the procedure well with no immediate complications.

## 2018-03-23 ENCOUNTER — Other Ambulatory Visit: Payer: Self-pay

## 2018-03-23 ENCOUNTER — Inpatient Hospital Stay (HOSPITAL_COMMUNITY): Payer: Medicare Other

## 2018-03-23 ENCOUNTER — Encounter (HOSPITAL_COMMUNITY): Payer: Self-pay | Admitting: Thoracic Surgery (Cardiothoracic Vascular Surgery)

## 2018-03-23 DIAGNOSIS — Z951 Presence of aortocoronary bypass graft: Secondary | ICD-10-CM

## 2018-03-23 DIAGNOSIS — I2111 ST elevation (STEMI) myocardial infarction involving right coronary artery: Secondary | ICD-10-CM

## 2018-03-23 LAB — GLUCOSE, CAPILLARY
GLUCOSE-CAPILLARY: 163 mg/dL — AB (ref 70–99)
GLUCOSE-CAPILLARY: 143 mg/dL — AB (ref 70–99)
GLUCOSE-CAPILLARY: 137 mg/dL — AB (ref 70–99)
GLUCOSE-CAPILLARY: 141 mg/dL — AB (ref 70–99)
GLUCOSE-CAPILLARY: 151 mg/dL — AB (ref 70–99)
GLUCOSE-CAPILLARY: 109 mg/dL — AB (ref 70–99)
Glucose-Capillary: 149 mg/dL — ABNORMAL HIGH (ref 70–99)
Glucose-Capillary: 161 mg/dL — ABNORMAL HIGH (ref 70–99)
Glucose-Capillary: 137 mg/dL — ABNORMAL HIGH (ref 70–99)
Glucose-Capillary: 117 mg/dL — ABNORMAL HIGH (ref 70–99)
Glucose-Capillary: 104 mg/dL — ABNORMAL HIGH (ref 70–99)
Glucose-Capillary: 101 mg/dL — ABNORMAL HIGH (ref 70–99)
Glucose-Capillary: 106 mg/dL — ABNORMAL HIGH (ref 70–99)
Glucose-Capillary: 100 mg/dL — ABNORMAL HIGH (ref 70–99)
Glucose-Capillary: 119 mg/dL — ABNORMAL HIGH (ref 70–99)
Glucose-Capillary: 108 mg/dL — ABNORMAL HIGH (ref 70–99)

## 2018-03-23 LAB — POCT I-STAT 3, ART BLOOD GAS (G3+)
ACID-BASE DEFICIT: 6 mmol/L — AB (ref 0.0–2.0)
ACID-BASE DEFICIT: 4 mmol/L — AB (ref 0.0–2.0)
ACID-BASE DEFICIT: 3 mmol/L — AB (ref 0.0–2.0)
BICARBONATE: 21.7 mmol/L (ref 20.0–28.0)
Bicarbonate: 20.6 mmol/L (ref 20.0–28.0)
Bicarbonate: 21.5 mmol/L (ref 20.0–28.0)
O2 SAT: 97 %
O2 Saturation: 98 %
O2 Saturation: 94 %
PH ART: 7.281 — AB (ref 7.350–7.450)
PH ART: 7.345 — AB (ref 7.350–7.450)
PO2 ART: 76 mmHg — AB (ref 83.0–108.0)
Patient temperature: 37.3
Patient temperature: 37.7
Patient temperature: 37.5
TCO2: 22 mmol/L (ref 22–32)
TCO2: 23 mmol/L (ref 22–32)
TCO2: 23 mmol/L (ref 22–32)
pCO2 arterial: 43.9 mmHg (ref 32.0–48.0)
pCO2 arterial: 39.7 mmHg (ref 32.0–48.0)
pCO2 arterial: 39.2 mmHg (ref 32.0–48.0)
pH, Arterial: 7.354 (ref 7.350–7.450)
pO2, Arterial: 113 mmHg — ABNORMAL HIGH (ref 83.0–108.0)
pO2, Arterial: 95 mmHg (ref 83.0–108.0)

## 2018-03-23 LAB — ECHO INTRAOPERATIVE TEE
Height: 71 in
WEIGHTICAEL: 3386.27 [oz_av]

## 2018-03-23 LAB — CBC
HCT: 24.4 % — ABNORMAL LOW (ref 39.0–52.0)
HEMATOCRIT: 27.4 % — AB (ref 39.0–52.0)
HEMOGLOBIN: 7.8 g/dL — AB (ref 13.0–17.0)
Hemoglobin: 9 g/dL — ABNORMAL LOW (ref 13.0–17.0)
MCH: 31.9 pg (ref 26.0–34.0)
MCH: 31.7 pg (ref 26.0–34.0)
MCHC: 32.8 g/dL (ref 30.0–36.0)
MCHC: 32 g/dL (ref 30.0–36.0)
MCV: 97.2 fL (ref 80.0–100.0)
MCV: 99.2 fL (ref 80.0–100.0)
Platelets: 136 10*3/uL — ABNORMAL LOW (ref 150–400)
Platelets: 120 10*3/uL — ABNORMAL LOW (ref 150–400)
RBC: 2.82 MIL/uL — ABNORMAL LOW (ref 4.22–5.81)
RBC: 2.46 MIL/uL — AB (ref 4.22–5.81)
RDW: 12.9 % (ref 11.5–15.5)
RDW: 13.2 % (ref 11.5–15.5)
WBC: 16.4 10*3/uL — AB (ref 4.0–10.5)
WBC: 16 10*3/uL — AB (ref 4.0–10.5)
nRBC: 0 % (ref 0.0–0.2)
nRBC: 0 % (ref 0.0–0.2)

## 2018-03-23 LAB — ECHOCARDIOGRAM COMPLETE
HEIGHTINCHES: 71 in
Weight: 3640.24 oz

## 2018-03-23 LAB — CREATININE, SERUM
CREATININE: 1.11 mg/dL (ref 0.61–1.24)
GFR calc Af Amer: >60 mL/min (ref >60)

## 2018-03-23 LAB — BASIC METABOLIC PANEL
Anion gap: 6 (ref 5–15)
BUN: 19 mg/dL (ref 8–23)
CALCIUM: 7.5 mg/dL — AB (ref 8.9–10.3)
CHLORIDE: 113 mmol/L — AB (ref 98–111)
CO2: 21 mmol/L — ABNORMAL LOW (ref 22–32)
CREATININE: 1.19 mg/dL (ref 0.61–1.24)
GFR calc Af Amer: >60 mL/min (ref >60)
GFR calc non Af Amer: 58 mL/min — ABNORMAL LOW (ref >60)
Glucose, Bld: 141 mg/dL — ABNORMAL HIGH (ref 70–99)
Potassium: 4.5 mmol/L (ref 3.5–5.1)
SODIUM: 140 mmol/L (ref 135–145)

## 2018-03-23 LAB — COOXEMETRY PANEL
CARBOXYHEMOGLOBIN: 1.5 % (ref 0.5–1.5)
CARBOXYHEMOGLOBIN: 1.6 % — AB (ref 0.5–1.5)
Carboxyhemoglobin: 1.5 % (ref 0.5–1.5)
METHEMOGLOBIN: 1.6 % — AB (ref 0.0–1.5)
METHEMOGLOBIN: 1.2 % (ref 0.0–1.5)
Methemoglobin: 1.6 % — ABNORMAL HIGH (ref 0.0–1.5)
O2 Saturation: 66.2 %
O2 Saturation: 49.4 %
O2 Saturation: 48.5 %
TOTAL HEMOGLOBIN: 8 g/dL — AB (ref 12.0–16.0)
Total hemoglobin: 8 g/dL — ABNORMAL LOW (ref 12.0–16.0)
Total hemoglobin: 8 g/dL — ABNORMAL LOW (ref 12.0–16.0)

## 2018-03-23 LAB — POCT ACTIVATED CLOTTING TIME: Activated Clotting Time: 131 seconds

## 2018-03-23 LAB — PREPARE RBC (CROSSMATCH)

## 2018-03-23 LAB — MRSA PCR SCREENING: MRSA by PCR: NEGATIVE

## 2018-03-23 LAB — MAGNESIUM
MAGNESIUM: 2.3 mg/dL (ref 1.7–2.4)
Magnesium: 2.8 mg/dL — ABNORMAL HIGH (ref 1.7–2.4)

## 2018-03-23 MED ORDER — SODIUM CHLORIDE 0.9% FLUSH: 10.0000 mL | INTRAVENOUS | Status: DC | PRN

## 2018-03-23 MED ORDER — PERFLUTREN LIPID MICROSPHERE
INTRAVENOUS | Status: AC
  Filled 2018-03-23: qty 10

## 2018-03-23 MED ORDER — INSULIN ASPART 100 UNIT/ML ~~LOC~~ SOLN
0.0000 [IU] | SUBCUTANEOUS | Status: DC
  Administered 2018-03-23: 4 [IU] via SUBCUTANEOUS
  Administered 2018-03-24 – 2018-03-25 (×5): 2 [IU] via SUBCUTANEOUS

## 2018-03-23 MED ORDER — ORAL CARE MOUTH RINSE
15.0000 mL | OROMUCOSAL | Status: DC
  Administered 2018-03-23 (×3): 15 mL via OROMUCOSAL

## 2018-03-23 MED ORDER — INSULIN DETEMIR 100 UNIT/ML ~~LOC~~ SOLN
20.0000 [IU] | Freq: Once | SUBCUTANEOUS | Status: AC
  Administered 2018-03-23: 20 [IU] via SUBCUTANEOUS
  Filled 2018-03-23: qty 0.2

## 2018-03-23 MED ORDER — CHLORHEXIDINE GLUCONATE CLOTH 2 % EX PADS
6.0000 | MEDICATED_PAD | Freq: Every day | CUTANEOUS | Status: DC
  Administered 2018-03-23 – 2018-03-24 (×2): 6 via TOPICAL

## 2018-03-23 MED ORDER — INSULIN DETEMIR 100 UNIT/ML ~~LOC~~ SOLN
20.0000 [IU] | Freq: Once | SUBCUTANEOUS | Status: DC
  Filled 2018-03-23: qty 0.2

## 2018-03-23 MED ORDER — AMIODARONE HCL IN DEXTROSE 360-4.14 MG/200ML-% IV SOLN
60.0000 mg/h | INTRAVENOUS | Status: AC
  Administered 2018-03-23: 60 mg/h via INTRAVENOUS
  Filled 2018-03-23: qty 200

## 2018-03-23 MED ORDER — ALBUMIN HUMAN 5 % IV SOLN
25.0000 g | Freq: Once | INTRAVENOUS | Status: AC
  Administered 2018-03-23: 25 g via INTRAVENOUS
  Filled 2018-03-23: qty 500

## 2018-03-23 MED ORDER — PERFLUTREN LIPID MICROSPHERE
4.0000 mL | Freq: Once | INTRAVENOUS | Status: AC
  Administered 2018-03-23: 4 mL via INTRAVENOUS
  Filled 2018-03-23: qty 4

## 2018-03-23 MED ORDER — FUROSEMIDE 10 MG/ML IJ SOLN
20.0000 mg | Freq: Once | INTRAMUSCULAR | Status: AC
  Administered 2018-03-23: 20 mg via INTRAVENOUS
  Filled 2018-03-23: qty 2

## 2018-03-23 MED ORDER — SODIUM CHLORIDE 0.9% IV SOLUTION: Freq: Once | INTRAVENOUS | Status: DC

## 2018-03-23 MED ORDER — SODIUM CHLORIDE 0.9% FLUSH
10.0000 mL | Freq: Two times a day (BID) | INTRAVENOUS | Status: DC
  Administered 2018-03-24: 10 mL
  Administered 2018-03-25: 20 mL

## 2018-03-23 MED ORDER — CHLORHEXIDINE GLUCONATE 0.12% ORAL RINSE (MEDLINE KIT): 15.0000 mL | Freq: Two times a day (BID) | OROMUCOSAL | Status: DC

## 2018-03-23 MED ORDER — ORAL CARE MOUTH RINSE
15.0000 mL | Freq: Two times a day (BID) | OROMUCOSAL | Status: DC
  Administered 2018-03-23 – 2018-03-30 (×7): 15 mL via OROMUCOSAL

## 2018-03-23 MED ORDER — AMIODARONE HCL IN DEXTROSE 360-4.14 MG/200ML-% IV SOLN
30.0000 mg/h | INTRAVENOUS | Status: DC
  Administered 2018-03-24 – 2018-03-25 (×4): 30 mg/h via INTRAVENOUS
  Filled 2018-03-23 (×5): qty 200

## 2018-03-23 MED ORDER — AMIODARONE LOAD VIA INFUSION
150.0000 mg | Freq: Once | INTRAVENOUS | Status: AC
  Administered 2018-03-23: 150 mg via INTRAVENOUS
  Filled 2018-03-23: qty 83.34

## 2018-03-23 MED FILL — Heparin Sodium (Porcine) Inj 1000 Unit/ML: INTRAMUSCULAR | Qty: 30 | Status: AC

## 2018-03-23 MED FILL — Verapamil HCl IV Soln 2.5 MG/ML: INTRAVENOUS | Qty: 2 | Status: AC

## 2018-03-23 MED FILL — Potassium Chloride Inj 2 mEq/ML: INTRAVENOUS | Qty: 40 | Status: AC

## 2018-03-23 MED FILL — Nitroglycerin IV Soln 100 MCG/ML in D5W: INTRA_ARTERIAL | Qty: 10 | Status: AC

## 2018-03-23 MED FILL — Magnesium Sulfate Inj 50%: INTRAMUSCULAR | Qty: 10 | Status: AC

## 2018-03-23 NOTE — Progress Notes (Signed)
Order for ballon pump sheath removal verified per post procedural orders. Procedure explained to patient and right femoral artery access site assessed: level 0, palpable dorsalis pedis and posterior tibial pulses.8 [place] Sheath removed and manual pressure applied for 25 minutes. Pre, peri, & post procedural vitals: HR 90, RR 12, O2 Sat 98%, BP 110/80, Pain level 0. Distal pulses remained intact after sheath removal. Access site level 0 and dressed with 4X4 gauze and tegaderm.   RN confirmed condition of site. Post procedural instructions discussed with return demonstration from patient.

## 2018-03-23 NOTE — Progress Notes (Signed)
[address]       [place] [address]             [phone]        CARDIOTHORACIC SURGERY PROGRESS NOTE   R1 Day Post-Op Procedure(s) (LRB): CORONARY ARTERY BYPASS GRAFTING (CABG) x 3. ENDOSCOPIC HARVESTING OF RIGHT GREATER SPAPHENOUS VEIN AND OPEN HARVESTING OF LEFT GREATER SAPHENOUS VEIN. BILATERAL IMA (N/A) TRANSESOPHAGEAL ECHOCARDIOGRAM (TEE) (N/A)  Subjective: Awake and alert.  Denies pain, SOB.  No complaints.  Objective: Vital signs: BP Readings from Last 1 Encounters:  03/23/18 (!) 96/50   Pulse Readings from Last 1 Encounters:  03/23/18 96   Resp Readings from Last 1 Encounters:  03/23/18 18   Temp Readings from Last 1 Encounters:  03/23/18 99.1 F (37.3 C)    Hemodynamics: PAP: (21-40)/(9-28) 32/15 CVP:  [3 mmHg-17 mmHg] 8 mmHg CO:  [4.4 L/min-6.1 L/min] 5.8 L/min CI:  [2 L/min/m2-2.8 L/min/m2] 2.7 L/min/m2  Mixed venous co-ox 66%   Physical Exam:  Rhythm:   sinus  Breath sounds: clear  Heart sounds:  RRR  Incisions:  Dressings dry, intact  Abdomen:  Soft, non-distended, non-tender  Extremities:  Warm, well-perfused  Chest tubes:  low volume thin serosanguinous output, no air leak    Intake/Output from previous day: 10/28 0701 - 10/29 0700 In: 7690.9 [I.V.:5561.3; Blood:900; NG/GT:30; IV Piggyback:1199.5] Out: 9147 [WGNFA:2130; Emesis/NG output:50; Blood:1900; Chest Tube:470] Intake/Output this shift: No intake/output data recorded.  Lab Results:  CBC: Recent Labs    03/22/18 2129 03/23/18 0516  WBC 23.7* 16.4*  HGB 10.9* 9.0*  HCT 33.0* 27.4*  PLT 145* 136*    BMET:  Recent Labs    03/22/18 1305  03/22/18 1921 03/22/18 2125 03/23/18 0516  NA 138   < > 140 142 140  K 3.8   < > 5.2* 5.1 4.5  CL 110   < > 107  --  113*  CO2 21*  --   --   --  21*  GLUCOSE 111*   < > 168* 151* 141*  BUN 13   < > 15  --  19  CREATININE 0.92   < > 0.70  --  1.19  CALCIUM 8.3*  --   --   --  7.5*   < > = values in this  interval not displayed.     PT/INR:   Recent Labs    03/22/18 2129  LABPROT 18.3*  INR 1.54    CBG (last 3)  Recent Labs    03/23/18 0515 03/23/18 0606 03/23/18 0734  GLUCAP 141* 151* 117*    ABG    Component Value Date/Time   PHART 7.354 03/23/2018 0356   PCO2ART 39.2 03/23/2018 0356   PO2ART 76.0 (L) 03/23/2018 0356   HCO3 21.7 03/23/2018 0356   TCO2 23 03/23/2018 0356   ACIDBASEDEF 3.0 (H) 03/23/2018 0356   O2SAT 66.2 03/23/2018 0700    CXR: Mild bibasilar ATX  Assessment/Plan: S/P Procedure(s) (LRB): CORONARY ARTERY BYPASS GRAFTING (CABG) x 3. ENDOSCOPIC HARVESTING OF RIGHT GREATER SPAPHENOUS VEIN AND OPEN HARVESTING OF LEFT GREATER SAPHENOUS VEIN. BILATERAL IMA (N/A) TRANSESOPHAGEAL ECHOCARDIOGRAM (TEE) (N/A)  Overall doing quite well POD1 Maintaining NSR w/ stable hemodynamics w/ IABP 1:1, somewhat vasodilated on levophed and Neo for BP support, filling pressures low, C.I. 2.5-3.0 and co-ox 66% Breathing comfortably w/ O2 sats 95-97% on 4 L/min via [place], CXR looks good S/P acute inferior wall STEMI Expected post op acute blood loss  anemia, stable Acute systolic CHF with expected post-op volume excess, baseline weight unknown Post op thrombocytopenia, mild   Wean IABP and possibly d/c later today  Wean drips slowly as tolerated  Mobilize once IABP out, then resume routine postop   Purcell Nails, MD 03/23/2018 8:16 AM

## 2018-03-23 NOTE — Progress Notes (Signed)
Patient ID: David [name], male   DOB: 18-Apr-1941, 77 y.o.   MRN: [id number] TCTS Evening Rounds:  Vasoplegic on levophed 5 mcg and neo 70 mcg, dop 2 CI 3.5, PA 32.11  Co-ox 48.5 and Hgb 7.8 so transfusing 2 units PRBC's  Urine output ok.  BMET    Component Value Date/Time   NA 142 03/23/2018 1520   K 4.3 03/23/2018 1520   CL 108 03/23/2018 1520   CO2 21 (L) 03/23/2018 0516   GLUCOSE 171 (H) 03/23/2018 1520   BUN 20 03/23/2018 1520   CREATININE 1.10 03/23/2018 1520   CALCIUM 7.5 (L) 03/23/2018 0516   GFRNONAA >60 03/23/2018 1517   GFRAA >60 03/23/2018 1517   CBC    Component Value Date/Time   WBC 16.0 (H) 03/23/2018 1517   RBC 2.46 (L) 03/23/2018 1517   HGB 6.8 (LL) 03/23/2018 1520   HCT 20.0 (L) 03/23/2018 1520   PLT 120 (L) 03/23/2018 1517   MCV 99.2 03/23/2018 1517   MCH 31.7 03/23/2018 1517   MCHC 32.0 03/23/2018 1517   RDW 13.2 03/23/2018 1517

## 2018-03-23 NOTE — Progress Notes (Signed)
Dr Cornelius Moras at bedside evaluating patient, ok to remove IABP.

## 2018-03-23 NOTE — Progress Notes (Signed)
  Amiodarone Drug - Drug Interaction Consult Note  Recommendations: Medications reviewed - no interacting medications - no changes needed  Amiodarone is metabolized by the cytochrome P450 system and therefore has the potential to cause many drug interactions. Amiodarone has an average plasma half-life of 50 days (range 20 to 100 days).   There is potential for drug interactions to occur several weeks or months after stopping treatment and the onset of drug interactions may be slow after initiating amiodarone.   []  Statins: Increased risk of myopathy. Simvastatin- restrict dose to 20mg  daily. Other statins: counsel patients to report any muscle pain or weakness immediately.  []  Anticoagulants: Amiodarone can increase anticoagulant effect. Consider warfarin dose reduction. Patients should be monitored closely and the dose of anticoagulant altered accordingly, remembering that amiodarone levels take several weeks to stabilize.  []  Antiepileptics: Amiodarone can increase plasma concentration of phenytoin, the dose should be reduced. Note that small changes in phenytoin dose can result in large changes in levels. Monitor patient and counsel on signs of toxicity.  []  Beta blockers: increased risk of bradycardia, AV block and myocardial depression. Sotalol - avoid concomitant use.  []   Calcium channel blockers (diltiazem and verapamil): increased risk of bradycardia, AV block and myocardial depression.  []   Cyclosporine: Amiodarone increases levels of cyclosporine. Reduced dose of cyclosporine is recommended.  []  Digoxin dose should be halved when amiodarone is started.  []  Diuretics: increased risk of cardiotoxicity if hypokalemia occurs.  []  Oral hypoglycemic agents (glyburide, glipizide, glimepiride): increased risk of hypoglycemia. Patient's glucose levels should be monitored closely when initiating amiodarone therapy.   []  Drugs that prolong the QT interval:  Torsades de pointes risk may be  increased with concurrent use - avoid if possible.  Monitor QTc, also keep magnesium/potassium WNL if concurrent therapy can't be avoided. Marland Kitchen Antibiotics: e.g. fluoroquinolones, erythromycin. . Antiarrhythmics: e.g. quinidine, procainamide, disopyramide, sotalol. . Antipsychotics: e.g. phenothiazines, haloperidol.  . Lithium, tricyclic antidepressants, and methadone.   Leota Sauers Pharm.D. CPP, BCPS Clinical Pharmacist [phone] 03/23/2018 9:56 PM

## 2018-03-23 NOTE — Progress Notes (Signed)
  Echocardiogram 2D Echocardiogram has been performed.  Belva Chimes 03/23/2018, 8:58 AM

## 2018-03-23 NOTE — Procedures (Signed)
Extubation Procedure Note  Patient Details:   Name: Hezzie Karim DOB: [birth year] MRN: [id number]   Airway Documentation: Patient extubated to 4 lpm.  VC 1100 ml, NIF -30, patient able to hold head off bed 10 sec.  Patient tolerated well with no complications. Patient able to breathe around deflated cuff and speak post procedure.  Vent end date: 03/23/18 Vent end time: 0245   Evaluation  O2 sats: stable throughout Complications: No apparent complications Patient did tolerate procedure well. Bilateral Breath Sounds: Clear   Yes  Dashonna Chagnon, Aloha Gell 03/23/2018, 2:52 AM

## 2018-03-24 ENCOUNTER — Inpatient Hospital Stay (HOSPITAL_COMMUNITY): Payer: Medicare Other

## 2018-03-24 LAB — BPAM RBC
BLOOD PRODUCT EXPIRATION DATE: 201911272359
BLOOD PRODUCT EXPIRATION DATE: 201911272359
ISSUE DATE / TIME: 201910291759
ISSUE DATE / TIME: 201910292038
UNIT TYPE AND RH: 6200
Unit Type and Rh: 6200

## 2018-03-24 LAB — BASIC METABOLIC PANEL
ANION GAP: 5 (ref 5–15)
BUN: 31 mg/dL — ABNORMAL HIGH (ref 8–23)
CALCIUM: 7.5 mg/dL — AB (ref 8.9–10.3)
CO2: 24 mmol/L (ref 22–32)
Chloride: 112 mmol/L — ABNORMAL HIGH (ref 98–111)
Creatinine, Ser: 1.35 mg/dL — ABNORMAL HIGH (ref 0.61–1.24)
GFR calc Af Amer: 58 mL/min — ABNORMAL LOW (ref >60)
GFR, EST NON AFRICAN AMERICAN: 50 mL/min — AB (ref >60)
GLUCOSE: 150 mg/dL — AB (ref 70–99)
POTASSIUM: 4.6 mmol/L (ref 3.5–5.1)
SODIUM: 141 mmol/L (ref 135–145)

## 2018-03-24 LAB — GLUCOSE, CAPILLARY
GLUCOSE-CAPILLARY: 140 mg/dL — AB (ref 70–99)
GLUCOSE-CAPILLARY: 141 mg/dL — AB (ref 70–99)
Glucose-Capillary: 143 mg/dL — ABNORMAL HIGH (ref 70–99)
Glucose-Capillary: 127 mg/dL — ABNORMAL HIGH (ref 70–99)
Glucose-Capillary: 125 mg/dL — ABNORMAL HIGH (ref 70–99)
Glucose-Capillary: 123 mg/dL — ABNORMAL HIGH (ref 70–99)

## 2018-03-24 LAB — CBC
HCT: 28.8 % — ABNORMAL LOW (ref 39.0–52.0)
Hemoglobin: 9.1 g/dL — ABNORMAL LOW (ref 13.0–17.0)
MCH: 31.2 pg (ref 26.0–34.0)
MCHC: 31.6 g/dL (ref 30.0–36.0)
MCV: 98.6 fL (ref 80.0–100.0)
Platelets: 107 10*3/uL — ABNORMAL LOW (ref 150–400)
RBC: 2.92 MIL/uL — AB (ref 4.22–5.81)
RDW: 14.4 % (ref 11.5–15.5)
WBC: 18.4 10*3/uL — AB (ref 4.0–10.5)
nRBC: 0 % (ref 0.0–0.2)

## 2018-03-24 LAB — COOXEMETRY PANEL
CARBOXYHEMOGLOBIN: 1.4 % (ref 0.5–1.5)
METHEMOGLOBIN: 1.6 % — AB (ref 0.0–1.5)
O2 Saturation: 54.1 %
Total hemoglobin: 9.6 g/dL — ABNORMAL LOW (ref 12.0–16.0)

## 2018-03-24 LAB — TYPE AND SCREEN
ABO/RH(D): A POS
Antibody Screen: NEGATIVE
Unit division: 0
Unit division: 0

## 2018-03-24 MED ORDER — ASPIRIN EC 325 MG PO TBEC
325.0000 mg | DELAYED_RELEASE_TABLET | Freq: Every day | ORAL | Status: AC
  Administered 2018-03-24: 325 mg via ORAL
  Filled 2018-03-24: qty 1

## 2018-03-24 MED ORDER — FUROSEMIDE 10 MG/ML IJ SOLN
15.0000 mg/h | INTRAVENOUS | Status: DC
  Administered 2018-03-24: 8 mg/h via INTRAVENOUS
  Administered 2018-03-25 – 2018-03-26 (×2): 15 mg/h via INTRAVENOUS
  Filled 2018-03-24 (×4): qty 25

## 2018-03-24 MED ORDER — ASPIRIN EC 81 MG PO TBEC
81.0000 mg | DELAYED_RELEASE_TABLET | Freq: Every day | ORAL | Status: DC
  Administered 2018-03-25 – 2018-03-31 (×7): 81 mg via ORAL
  Filled 2018-03-24 (×7): qty 1

## 2018-03-24 MED ORDER — ATORVASTATIN CALCIUM 80 MG PO TABS
80.0000 mg | ORAL_TABLET | Freq: Every day | ORAL | Status: DC
  Administered 2018-03-25 – 2018-03-30 (×6): 80 mg via ORAL
  Filled 2018-03-24 (×6): qty 1

## 2018-03-24 MED ORDER — FUROSEMIDE 10 MG/ML IJ SOLN: 40.0000 mg | Freq: Two times a day (BID) | INTRAMUSCULAR | Status: DC

## 2018-03-24 MED ORDER — ENOXAPARIN SODIUM 30 MG/0.3ML ~~LOC~~ SOLN
30.0000 mg | Freq: Every day | SUBCUTANEOUS | Status: DC
  Administered 2018-03-25 – 2018-03-30 (×6): 30 mg via SUBCUTANEOUS
  Filled 2018-03-24 (×6): qty 0.3

## 2018-03-24 MED ORDER — CLOPIDOGREL BISULFATE 75 MG PO TABS
75.0000 mg | ORAL_TABLET | Freq: Every day | ORAL | Status: DC
  Administered 2018-03-25 – 2018-03-31 (×7): 75 mg via ORAL
  Filled 2018-03-24 (×7): qty 1

## 2018-03-24 MED FILL — Sodium Chloride IV Soln 0.9%: INTRAVENOUS | Qty: 2000 | Status: AC

## 2018-03-24 MED FILL — Mannitol IV Soln 20%: INTRAVENOUS | Qty: 1000 | Status: AC

## 2018-03-24 MED FILL — Electrolyte-R (PH 7.4) Solution: INTRAVENOUS | Qty: 4000 | Status: AC

## 2018-03-24 MED FILL — Heparin Sodium (Porcine) Inj 1000 Unit/ML: INTRAMUSCULAR | Qty: 30 | Status: AC

## 2018-03-24 MED FILL — Sodium Bicarbonate IV Soln 8.4%: INTRAVENOUS | Qty: 50 | Status: AC

## 2018-03-24 MED FILL — Lidocaine HCl(Cardiac) IV PF Soln Pref Syr 100 MG/5ML (2%): INTRAVENOUS | Qty: 25 | Status: AC

## 2018-03-24 MED FILL — Heparin Sodium (Porcine) Inj 1000 Unit/ML: INTRAMUSCULAR | Qty: 40 | Status: AC

## 2018-03-24 NOTE — Progress Notes (Signed)
[address]       [place] [address]             [phone]        CARDIOTHORACIC SURGERY PROGRESS NOTE   R2 Days Post-Op Procedure(s) (LRB): CORONARY ARTERY BYPASS GRAFTING (CABG) x 3. ENDOSCOPIC HARVESTING OF RIGHT GREATER SPAPHENOUS VEIN AND OPEN HARVESTING OF LEFT GREATER SAPHENOUS VEIN. BILATERAL IMA (N/A) TRANSESOPHAGEAL ECHOCARDIOGRAM (TEE) (N/A)  Subjective: Looks good.  Reports feeling pretty well.  Some sternal pain last night, currently no pain and no SOB.  Episode paroxysmal atrial fibrillation last night which converted to NSR after IV bolus amiodarone  Objective: Vital signs: BP Readings from Last 1 Encounters:  03/24/18 106/64   Pulse Readings from Last 1 Encounters:  03/24/18 70   Resp Readings from Last 1 Encounters:  03/24/18 (!) 25   Temp Readings from Last 1 Encounters:  03/24/18 99 F (37.2 C)    Hemodynamics: PAP: (31-67)/(11-29) 39/16 CVP:  [5 mmHg-18 mmHg] 5 mmHg CO:  [5.1 L/min-7.9 L/min] 5.1 L/min CI:  [2.3 L/min/m2-3.6 L/min/m2] 2.3 L/min/m2  Mixed venous co-ox 54%   Physical Exam:  Rhythm:   sinus  Breath sounds: clear  Heart sounds:  RRR  Incisions:  Dressings dry, intact  Abdomen:  Soft, moderately-distended, non-tender  Extremities:  Warm, well-perfused, swollen  Chest tubes:  Decreasing but significiant volume thin serosanguinous output, no air leak    Intake/Output from previous day: 10/29 0701 - 10/30 0700 In: 3385.4 [P.O.:240; I.V.:2855.7; Blood:30; IV Piggyback:259.7] Out: 3342 [Urine:1802; Chest Tube:1540] Intake/Output this shift: No intake/output data recorded.  Lab Results:  CBC: Recent Labs    03/23/18 1517 03/23/18 1520 03/24/18 0445  WBC 16.0*  --  18.4*  HGB 7.8* 6.8* 9.1*  HCT 24.4* 20.0* 28.8*  PLT 120*  --  107*    BMET:  Recent Labs    03/23/18 0516  03/23/18 1520 03/24/18 0445  NA 140  --  142 141  K 4.5  --  4.3 4.6  CL 113*  --  108 112*  CO2 21*  --   --  24    GLUCOSE 141*  --  171* 150*  BUN 19  --  20 31*  CREATININE 1.19   < > 1.10 1.35*  CALCIUM 7.5*  --   --  7.5*   < > = values in this interval not displayed.     PT/INR:   Recent Labs    03/22/18 2129  LABPROT 18.3*  INR 1.54    CBG (last 3)  Recent Labs    03/23/18 2023 03/24/18 0029 03/24/18 0318  GLUCAP 108* 141* 143*    ABG    Component Value Date/Time   PHART 7.354 03/23/2018 0356   PCO2ART 39.2 03/23/2018 0356   PO2ART 76.0 (L) 03/23/2018 0356   HCO3 21.7 03/23/2018 0356   TCO2 22 03/23/2018 1520   ACIDBASEDEF 3.0 (H) 03/23/2018 0356   O2SAT 54.1 03/24/2018 0438    CXR: Remarkably clear.  Mild bibasilar ATX  Assessment/Plan: S/P Procedure(s) (LRB): CORONARY ARTERY BYPASS GRAFTING (CABG) x 3. ENDOSCOPIC HARVESTING OF RIGHT GREATER SPAPHENOUS VEIN AND OPEN HARVESTING OF LEFT GREATER SAPHENOUS VEIN. BILATERAL IMA (N/A) TRANSESOPHAGEAL ECHOCARDIOGRAM (TEE) (N/A)  Overall doing well POD2 Maintaining NSR w/ stable hemodynamics off all drips except low dose levophed, cardiac index stable and co-ox 54% Episode PAF last night, maintaining NSR on IV amiodarone Breathing comfortably w/ O2 sats 95%, CXR clear S/P acute inferior  wall STEMI Expected post op acute blood loss anemia, improved after transfusion Acute systolic CHF with expected post-op volume excess, baseline weight unknown, needs diuresis Post op thrombocytopenia, mild   Mobilize out of bed  Wean levophed as tolerated  Continue IV amiodarone for now  Start lasix drip  DAPT  Statin, check lipid profile  Add beta blocker eventually once BP and HR more stable  Possibly add ACE-I or ARB eventually if BP will allow   Purcell Nails, MD 03/24/2018 8:04 AM

## 2018-03-24 NOTE — Progress Notes (Signed)
AO4, minimal complaints of pain-prn oxy/sched tylenol.  2L dips to 80's on room air. LS dim. IS to 950. NSR vs rate controlled afib this afternoon 70-90's. SBP stable, map >65. VVI backup @ 50. MCT/PCT total out 415. No air leak, no crepitus noted. Incision dressings WNL. Introducer dressing changed.Tol clear liquid diet, minimal po intake, hypoactive, denies nausea.   Lasix gtts, 1+ generalized edema, +pulses. Foley 700 out. Ambulated in halls, tol well. Family @ bedside

## 2018-03-24 NOTE — Plan of Care (Signed)
Patient alert and oriented, denies pain at present, see assessment.  Offers no complaints at present, family at bedside, supportive.  Monitoring as per protocol.  Remains in AFIB at this time.

## 2018-03-24 NOTE — Progress Notes (Signed)
Patient ID: David [name], male   DOB: 1940-08-01, 77 y.o.   MRN: [id number] EVENING ROUNDS NOTE :     [address]       [place] [address]             [phone]                 2 Days Post-Op Procedure(s) (LRB): CORONARY ARTERY BYPASS GRAFTING (CABG) x 3. ENDOSCOPIC HARVESTING OF RIGHT GREATER SPAPHENOUS VEIN AND OPEN HARVESTING OF LEFT GREATER SAPHENOUS VEIN. BILATERAL IMA (N/A) TRANSESOPHAGEAL ECHOCARDIOGRAM (TEE) (N/A)  Total Length of Stay:  LOS: 2 days  BP 103/66   Pulse 81   Temp 98.1 F (36.7 C) (Oral)   Resp 17   Ht 5\' 11"  (1.803 m)   Wt 103.6 kg   SpO2 95%   BMI 31.85 kg/m   .Intake/Output      10/29 0701 - 10/30 0700 10/30 0701 - 10/31 0700   P.O. 240 540   I.V. (mL/kg) 2855.7 (27.6) 641.5 (6.2)   Blood 30    NG/GT     IV Piggyback 259.7 110.3   Total Intake(mL/kg) 3385.4 (32.7) 1291.8 (12.5)   Urine (mL/kg/hr) 1802 (0.7) 715 (0.6)   Emesis/NG output     Blood     Chest Tube 1540 415   Total Output 3342 1130   Net +43.4 +161.8          . sodium chloride    . amiodarone 30 mg/hr (03/24/18 1700)  . furosemide (LASIX) infusion 8 mg/hr (03/24/18 1700)  . lactated ringers Stopped (03/23/18 0348)  . lactated ringers 10 mL/hr at 03/24/18 1700     Lab Results  Component Value Date   WBC 18.4 (H) 03/24/2018   HGB 9.1 (L) 03/24/2018   HCT 28.8 (L) 03/24/2018   PLT 107 (L) 03/24/2018   GLUCOSE 150 (H) 03/24/2018   CHOL 225 (H) 03/22/2018   TRIG 78 03/22/2018   HDL 40 (L) 03/22/2018   LDLCALC 169 (H) 03/22/2018   ALT 43 03/22/2018   AST 175 (H) 03/22/2018   NA 141 03/24/2018   K 4.6 03/24/2018   CL 112 (H) 03/24/2018   CREATININE 1.35 (H) 03/24/2018   BUN 31 (H) 03/24/2018   CO2 24 03/24/2018   INR 1.54 03/22/2018   HGBA1C 5.4 03/22/2018   Into afib today , started on amniodrone and lasix drip today    Delight Ovens MD  Beeper [phone] Office [phone] 03/24/2018 6:47 PM

## 2018-03-25 ENCOUNTER — Inpatient Hospital Stay (HOSPITAL_COMMUNITY): Payer: Medicare Other

## 2018-03-25 ENCOUNTER — Inpatient Hospital Stay: Payer: Self-pay

## 2018-03-25 LAB — BASIC METABOLIC PANEL
ANION GAP: 13 (ref 5–15)
Anion gap: 7 (ref 5–15)
BUN: 53 mg/dL — AB (ref 8–23)
BUN: 58 mg/dL — ABNORMAL HIGH (ref 8–23)
CALCIUM: 7.4 mg/dL — AB (ref 8.9–10.3)
CALCIUM: 7.9 mg/dL — AB (ref 8.9–10.3)
CO2: 21 mmol/L — ABNORMAL LOW (ref 22–32)
CO2: 23 mmol/L (ref 22–32)
CREATININE: 1.93 mg/dL — AB (ref 0.61–1.24)
Chloride: 108 mmol/L (ref 98–111)
Chloride: 100 mmol/L (ref 98–111)
Creatinine, Ser: 1.89 mg/dL — ABNORMAL HIGH (ref 0.61–1.24)
GFR calc non Af Amer: 32 mL/min — ABNORMAL LOW (ref >60)
GFR, EST AFRICAN AMERICAN: 37 mL/min — AB (ref >60)
GFR, EST AFRICAN AMERICAN: 38 mL/min — AB (ref >60)
GFR, EST NON AFRICAN AMERICAN: 33 mL/min — AB (ref >60)
GLUCOSE: 126 mg/dL — AB (ref 70–99)
Glucose, Bld: 123 mg/dL — ABNORMAL HIGH (ref 70–99)
Potassium: 4.3 mmol/L (ref 3.5–5.1)
Potassium: 3.2 mmol/L — ABNORMAL LOW (ref 3.5–5.1)
SODIUM: 136 mmol/L (ref 135–145)
Sodium: 136 mmol/L (ref 135–145)

## 2018-03-25 LAB — LIPID PANEL
CHOL/HDL RATIO: 5.6 ratio
CHOLESTEROL: 123 mg/dL (ref 0–200)
HDL: 22 mg/dL — ABNORMAL LOW (ref >40)
LDL Cholesterol: 66 mg/dL (ref 0–99)
Triglycerides: 175 mg/dL — ABNORMAL HIGH (ref <150)
VLDL: 35 mg/dL (ref 0–40)

## 2018-03-25 LAB — COOXEMETRY PANEL
CARBOXYHEMOGLOBIN: 1.2 % (ref 0.5–1.5)
Methemoglobin: 1.7 % — ABNORMAL HIGH (ref 0.0–1.5)
O2 Saturation: 57.1 %
Total hemoglobin: 9.3 g/dL — ABNORMAL LOW (ref 12.0–16.0)

## 2018-03-25 LAB — CBC
HCT: 28.5 % — ABNORMAL LOW (ref 39.0–52.0)
Hemoglobin: 8.9 g/dL — ABNORMAL LOW (ref 13.0–17.0)
MCH: 31 pg (ref 26.0–34.0)
MCHC: 31.2 g/dL (ref 30.0–36.0)
MCV: 99.3 fL (ref 80.0–100.0)
NRBC: 0 % (ref 0.0–0.2)
PLATELETS: 91 10*3/uL — AB (ref 150–400)
RBC: 2.87 MIL/uL — ABNORMAL LOW (ref 4.22–5.81)
RDW: 14.2 % (ref 11.5–15.5)
WBC: 8.2 10*3/uL (ref 4.0–10.5)

## 2018-03-25 LAB — POCT I-STAT, CHEM 8
BUN: 20 mg/dL (ref 8–23)
CHLORIDE: 108 mmol/L (ref 98–111)
CREATININE: 1.1 mg/dL (ref 0.61–1.24)
Calcium, Ion: 1.09 mmol/L — ABNORMAL LOW (ref 1.15–1.40)
Glucose, Bld: 171 mg/dL — ABNORMAL HIGH (ref 70–99)
HCT: 20 % — ABNORMAL LOW (ref 39.0–52.0)
Hemoglobin: 6.8 g/dL — CL (ref 13.0–17.0)
POTASSIUM: 4.3 mmol/L (ref 3.5–5.1)
Sodium: 142 mmol/L (ref 135–145)
TCO2: 22 mmol/L (ref 22–32)

## 2018-03-25 LAB — GLUCOSE, CAPILLARY
GLUCOSE-CAPILLARY: 113 mg/dL — AB (ref 70–99)
GLUCOSE-CAPILLARY: 139 mg/dL — AB (ref 70–99)
Glucose-Capillary: 145 mg/dL — ABNORMAL HIGH (ref 70–99)
Glucose-Capillary: 118 mg/dL — ABNORMAL HIGH (ref 70–99)
Glucose-Capillary: 117 mg/dL — ABNORMAL HIGH (ref 70–99)

## 2018-03-25 MED ORDER — INSULIN ASPART 100 UNIT/ML ~~LOC~~ SOLN
0.0000 [IU] | Freq: Three times a day (TID) | SUBCUTANEOUS | Status: DC
  Administered 2018-03-25 – 2018-03-26 (×3): 2 [IU] via SUBCUTANEOUS

## 2018-03-25 MED ORDER — SODIUM CHLORIDE 0.9% FLUSH: 3.0000 mL | INTRAVENOUS | Status: DC | PRN

## 2018-03-25 MED ORDER — POTASSIUM CHLORIDE CRYS ER 20 MEQ PO TBCR
20.0000 meq | EXTENDED_RELEASE_TABLET | ORAL | Status: DC
  Administered 2018-03-25: 20 meq via ORAL
  Filled 2018-03-25: qty 1

## 2018-03-25 MED ORDER — MOVING RIGHT ALONG BOOK
Freq: Once | Status: AC
  Administered 2018-03-25: 13:00:00
  Filled 2018-03-25: qty 1

## 2018-03-25 MED ORDER — SODIUM CHLORIDE 0.9% FLUSH
3.0000 mL | Freq: Two times a day (BID) | INTRAVENOUS | Status: DC
  Administered 2018-03-25 – 2018-03-29 (×6): 3 mL via INTRAVENOUS

## 2018-03-25 MED ORDER — SODIUM CHLORIDE 0.9 % IV SOLN
250.0000 mL | INTRAVENOUS | Status: DC | PRN
  Administered 2018-03-26: 250 mL via INTRAVENOUS

## 2018-03-25 MED ORDER — INSULIN ASPART 100 UNIT/ML ~~LOC~~ SOLN: 0.0000 [IU] | SUBCUTANEOUS | Status: DC

## 2018-03-25 MED ORDER — SODIUM CHLORIDE 0.9% FLUSH
10.0000 mL | Freq: Two times a day (BID) | INTRAVENOUS | Status: DC
  Administered 2018-03-25: 10 mL
  Administered 2018-03-26: 30 mL
  Administered 2018-03-26 – 2018-03-28 (×3): 20 mL
  Administered 2018-03-29 (×2): 10 mL
  Administered 2018-03-30: 20 mL
  Administered 2018-03-30: 10 mL
  Administered 2018-03-31: 30 mL

## 2018-03-25 MED ORDER — SODIUM CHLORIDE 0.9% FLUSH: 10.0000 mL | INTRAVENOUS | Status: DC | PRN

## 2018-03-25 MED ORDER — CHLORHEXIDINE GLUCONATE CLOTH 2 % EX PADS
6.0000 | MEDICATED_PAD | Freq: Every day | CUTANEOUS | Status: DC
  Administered 2018-03-25 – 2018-03-26 (×2): 6 via TOPICAL

## 2018-03-25 MED ORDER — POTASSIUM CHLORIDE 10 MEQ/50ML IV SOLN
10.0000 meq | INTRAVENOUS | Status: AC
  Administered 2018-03-25 – 2018-03-26 (×6): 10 meq via INTRAVENOUS
  Filled 2018-03-25 (×6): qty 50

## 2018-03-25 NOTE — Progress Notes (Addendum)
TCTS DAILY ICU PROGRESS NOTE                   [address]            [place] [address]          [phone]   3 Days Post-Op Procedure(s) (LRB): CORONARY ARTERY BYPASS GRAFTING (CABG) x 3. ENDOSCOPIC HARVESTING OF RIGHT GREATER SPAPHENOUS VEIN AND OPEN HARVESTING OF LEFT GREATER SAPHENOUS VEIN. BILATERAL IMA (N/A) TRANSESOPHAGEAL ECHOCARDIOGRAM (TEE) (N/A)  Total Length of Stay:  LOS: 3 days   Subjective:  Patient without specific complaints.  States he is doing okay.  Denies N/V, pain is well controlled. Objective: Vital signs in last 24 hours: Temp:  [98.1 F (36.7 C)-99 F (37.2 C)] 98.4 F (36.9 C) (10/31 0000) Pulse Rate:  [49-107] 78 (10/31 0700) Cardiac Rhythm: Atrial fibrillation (10/30 2000) Resp:  [9-28] 19 (10/31 0700) BP: (91-121)/(51-77) 111/65 (10/31 0700) SpO2:  [84 %-97 %] 94 % (10/31 0700) Weight:  [103.1 kg] 103.1 kg (10/31 0500)  Filed Weights   03/23/18 0430 03/24/18 0500 03/25/18 0500  Weight: 103.2 kg 103.6 kg 103.1 kg    Weight change: -0.5 kg   Hemodynamic parameters for last 24 hours:    Intake/Output from previous day: 10/30 0701 - 10/31 0700 In: 1844.2 [P.O.:540; I.V.:1193.9; IV Piggyback:110.3] Out: 2585 [Urine:1740; Chest Tube:845]  Current Meds: Scheduled Meds: . acetaminophen  1,000 mg Oral Q6H  . aspirin EC  81 mg Oral Daily  . atorvastatin  80 mg Oral q1800  . bisacodyl  10 mg Oral Daily   Or  . bisacodyl  10 mg Rectal Daily  . Chlorhexidine Gluconate Cloth  6 each Topical Daily  . clopidogrel  75 mg Oral Daily  . docusate sodium  200 mg Oral Daily  . enoxaparin (LOVENOX) injection  30 mg Subcutaneous QHS  . insulin aspart  0-24 Units Subcutaneous TID AC & HS  . mouth rinse  15 mL Mouth Rinse BID  . moving right along book   Does not apply Once  . pantoprazole  40 mg Oral Daily  . sodium chloride flush  10-40 mL Intracatheter Q12H  . sodium chloride flush  3 mL Intravenous Q12H  . sodium chloride flush   3 mL Intravenous Q12H   Continuous Infusions: . sodium chloride    . sodium chloride    . amiodarone 30 mg/hr (03/25/18 0700)  . furosemide (LASIX) infusion 8 mg/hr (03/25/18 0700)  . lactated ringers Stopped (03/23/18 0348)  . lactated ringers 10 mL/hr at 03/25/18 0700   PRN Meds:.sodium chloride, metoprolol tartrate, morphine injection, ondansetron (ZOFRAN) IV, oxyCODONE, sodium chloride flush, sodium chloride flush, sodium chloride flush, traMADol  General appearance: alert, cooperative and no distress Heart: regular rate and rhythm Lungs: clear to auscultation bilaterally Abdomen: soft, non-tender; bowel sounds normal; no masses,  no organomegaly Extremities: edema 1+ Wound: clean and dry  Lab Results: CBC: Recent Labs    03/24/18 0445 03/25/18 0256  WBC 18.4* 8.2  HGB 9.1* 8.9*  HCT 28.8* 28.5*  PLT 107* 91*   BMET:  Recent Labs    03/24/18 0445 03/25/18 0256  NA 141 136  K 4.6 4.3  CL 112* 108  CO2 24 21*  GLUCOSE 150* 123*  BUN 31* 53*  CREATININE 1.35* 1.93*  CALCIUM 7.5* 7.4*    CMET: Lab Results  Component Value Date   WBC 8.2 03/25/2018   HGB 8.9 (L) 03/25/2018   HCT 28.5 (  L) 03/25/2018   PLT 91 (L) 03/25/2018   GLUCOSE 123 (H) 03/25/2018   CHOL 123 03/25/2018   TRIG 175 (H) 03/25/2018   HDL 22 (L) 03/25/2018   LDLCALC 66 03/25/2018   ALT 43 03/22/2018   AST 175 (H) 03/22/2018   NA 136 03/25/2018   K 4.3 03/25/2018   CL 108 03/25/2018   CREATININE 1.93 (H) 03/25/2018   BUN 53 (H) 03/25/2018   CO2 21 (L) 03/25/2018   INR 1.54 03/22/2018   HGBA1C 5.4 03/22/2018      PT/INR:  Recent Labs    03/22/18 2129  LABPROT 18.3*  INR 1.54   Radiology: [place] Ekg Site Rite  Result Date: 03/25/2018 If Site Rite image not attached, placement could not be confirmed due to current cardiac rhythm.    Assessment/Plan: S/P Procedure(s) (LRB): CORONARY ARTERY BYPASS GRAFTING (CABG) x 3. ENDOSCOPIC HARVESTING OF RIGHT GREATER SPAPHENOUS VEIN AND  OPEN HARVESTING OF LEFT GREATER SAPHENOUS VEIN. BILATERAL IMA (N/A) TRANSESOPHAGEAL ECHOCARDIOGRAM (TEE) (N/A)  1. CV- PAF, currently NSR- on IV Amiodarone, off all other drips- continue ASA, Plavix  2. Pulm- will d/c mediastinal chest tubes today, leave pleural chest tubes in place, CXR with some atelectasis, no significant pleural effusions, continue IS 3. Renal- 1.93, Elevated serum creatinine, likely due to prerenal azotemia +/- acute kidney injury caused by ATN, good U/O on Lasix drip, dosing adjusted by Dr. Cornelius Moras this morning, K is WNL, continue supplementation 4. Expected post operative blood loss anemia, mild at 8.9 5. CBGs controlled, change cbgs to [facility] and HS 6. Dispo- patient stable, in NSR this morning, will continue IV Amiodarone for now, continue ASA, Plavix, continue Lasix drip with good U/O, monitor creatinine, pleural chest tubes remain in place today, continue current care    Erin Barrett 03/25/2018 8:10 AM    I have seen and examined the patient and agree with the assessment and plan as outlined.  Purcell Nails, MD 03/25/2018

## 2018-03-25 NOTE — Progress Notes (Signed)
      [address]       [place] [address]             [phone]      POD # 3 CABG  Up in chair  BP 121/64   Pulse 78   Temp 98.4 F (36.9 C) (Oral)   Resp (!) 23   Ht 5\' 11"  (1.803 m)   Wt 103.1 kg   SpO2 92%   BMI 31.70 kg/m   Intake/Output Summary (Last 24 hours) at 03/25/2018 1831 Last data filed at 03/25/2018 1800 Gross per 24 hour  Intake 2005.85 ml  Output 4380 ml  Net -2374.15 ml  Creatinine stable at 1.89 K= 3.2- diuresing, will supplement K  Viviann Spare C. Dorris Fetch, MD [facility] [phone]

## 2018-03-25 NOTE — Progress Notes (Signed)
Peripherally Inserted Central Catheter/Midline Placement  The IV Nurse has discussed with the patient and/or persons authorized to consent for the patient, the purpose of this procedure and the potential benefits and risks involved with this procedure.  The benefits include less needle sticks, lab draws from the catheter, and the patient may be discharged home with the catheter. Risks include, but not limited to, infection, bleeding, blood clot (thrombus formation), and puncture of an artery; nerve damage and irregular heartbeat and possibility to perform a PICC exchange if needed/ordered by physician.  Alternatives to this procedure were also discussed.  Bard Power PICC patient education guide, fact sheet on infection prevention and patient information card has been provided to patient /or left at bedside.    PICC/Midline Placement Documentation  PICC Double Lumen 03/25/18 PICC Right Brachial 42 cm 0 cm (Active)  Indication for Insertion or Continuance of Line Vasoactive infusions 03/25/2018 10:30 AM  Exposed Catheter (cm) 0 cm 03/25/2018 10:30 AM  Site Assessment Clean;Dry;Intact 03/25/2018 10:30 AM  Lumen #1 Status Flushed;Blood return noted 03/25/2018 10:30 AM  Lumen #2 Status Flushed;Blood return noted 03/25/2018 10:30 AM  Dressing Type Transparent 03/25/2018 10:30 AM  Dressing Status Clean;Dry;Intact;Antimicrobial disc in place 03/25/2018 10:30 AM  Dressing Intervention New dressing 03/25/2018 10:30 AM  Dressing Change Due 04/01/18 03/25/2018 10:30 AM       Reginia Forts Albarece 03/25/2018, 10:31 AM

## 2018-03-25 NOTE — Plan of Care (Signed)
Patient sitting in chair, family at bedside.  Currently on room air, tolerating well.  Remains in NSR.  States passing flatus, but no BM at this time.  Lasix gtt increased today, diarhesising well.  Monitoring.

## 2018-03-25 NOTE — Discharge Summary (Signed)
Physician Discharge Summary  Patient ID: Courtez Twaddle MRN: [id number] DOB/AGE: 1941/03/18 77 y.o.  Admit date: 03/22/2018 Discharge date: 03/31/2018  Admission Diagnoses:  Patient Active Problem List   Diagnosis Date Noted  . STEMI involving right coronary artery (HCC) 03/22/2018  . Coronary artery disease involving native coronary artery of native heart with unstable angina pectoris (HCC) 03/22/2018  . ST elevation myocardial infarction involving right coronary artery [facility])    Discharge Diagnoses:   Patient Active Problem List   Diagnosis Date Noted  . STEMI involving right coronary artery (HCC) 03/22/2018  . S/P CABG x 3 03/22/2018  . Coronary artery disease involving native coronary artery of native heart with unstable angina pectoris (HCC) 03/22/2018  . ST elevation myocardial infarction involving right coronary artery [facility])    Discharged Condition: good  History of Present Illness:  Mr. Denne is a 77 yo white male with no previous history of coronary artery disease who has been referred for emergent surgical consultation. Patient states that he does not have a primary care physician.  He reportedly had been seen on 2 previous occasions as an outpatient for episodes of chest pain and were told that his symptoms were likely caused by indigestion.  The patient reports that over the past month he has had progressive symptoms of substernal chest pain radiating to his left arm with physical exertion.  Symptoms have been associated with shortness of breath and usually brought on with walking up a hill.  He says up until this morning symptoms always relieved by rest.  This morning he woke up per his usual and drove to his daughter's home at approximately 7:30am.  When he got out of his car and started walking up the hill he began to experience chest pain that was more severe and associated with shortness of breath.  Symptoms became quite intense and persisted, prompting his daughter to  take him directly to the emergency department at [facility] in [place] on 03/22/2018.  There EKG revealed diffuse ST segment elevation across the inferior leads.  The patient was transported directly to the [facility] cardiac Cath Lab where catheterization was performed by Dr. Okey Dupre.  The patient was found to have severe three-vessel coronary artery disease including acute thrombotic occlusion of the right coronary artery.  Primary balloon angioplasty of the right coronary artery was performed.  The patient's chest pain improved but did not resolve completely.  Emergent cardiothoracic surgical consultation was requested and an intra-aortic balloon pump was placed.    Hospital Course:   He was evaluated by Dr. Cornelius Moras who was in agreement the patient would require emergent bypass procedure.  The risks and benefits of the procedure were explained to the patient and he was agreeable to proceed.  He was taken to the operating room and underwent CABG x 3 utilizing LIMA to LAD, Free RIMA to OM, and SVG to PDA.  He also underwent endoscopic harvest of greater saphenous vein from right leg.  He also underwent open harvest of the saphenous vein from his left leg.  He tolerated the procedure and was taken to the SICU in stable condition.  During his stay in the SICU the patient was weaned and extubated without difficulty.  He was weaned off Levophed, Dopamine, and Neo-synephrine drips as tolerated.  He was weaned off his balloon pump and it was subsequently removed on 03/23/2018.  He developed Atrial Fibrillation and was treated with IV Amiodoarone.  He successfully converted to NSR.  He  was started on a Lasix drip for significant hypervolemia.  He had good response.  He developed an AKI with creatinine level going up to 1.93.  This was felt to be caused by ATN.  He was started on DAPT therapy for his presenting STEMI.  He required transfusion for expected post operative blood loss anemia.  The patient  diuresed very well on lasix drip.  He was able to get off oxygen on POD #3.  He was maintaining NSR and was felt stable for transfer to the telemetry unit on 03/26/2018.  He continues to make progress.  He remains in NSR and his pacing wires have been removed without difficulty.  His creatinine has recovered with most recent level being 1.5.  He continues to ambulate independently.  His incisions are healing without evidence of infection.  He has been transitioned to an oral regimen of lasix.  He is tolerating a diet.  He is medically stable for discharge home today.       Significant Diagnostic Studies: angiography:   1. Severe three-vessel CAD, as detailed below.  Culprit lesion is acute plaque rupture and thrombotic subtotal occlusion of the mid RCA with TIMI-1 flow.. 2. Moderately elevated left ventricular filling pressure with LVEF of 35 to 45%.  There is inferior akinesis. 3. Successful PTCA of the mid RCA using a 2.5 x 15 mm balloon with reduction of stenosis from 99% to 30% and restoration of TIMI-3 flow. 4. Successful placement of 50 mL intra-aortic balloon pump via the right common femoral artery.  Treatments: surgery:    Emergency Coronary Artery Bypass Grafting x 3              Left Internal Mammary Artery to Distal Left Anterior Descending Coronary Artery             Free Right Internal Mammary Artery to Obtuse Marginal Branch of Left Circumflex Coronary Artery             Reversed Saphenous Greater Vein Graft to Posterior Descending Coronary Artery             Endoscopic Saphenous Vein Harvest from Right Thigh and Lower Leg             Open Saphenous Vein Harvest from Left Thigh and Lower Leg  Discharge Exam: Blood pressure (!) 111/56, pulse 68, temperature 98.6 F (37 C), temperature source Oral, resp. rate (!) 25, height 5\' 11"  (1.803 m), weight 95.7 kg, SpO2 95 %. General appearance: alert, cooperative and no distress Heart: regular rate and rhythm and occas irregular  beat Lungs: mildly dim in bases Abdomen: benign Extremities: edema conts to slowly improve Wound: incis stable- no increase in erethema Disposition: Home  Discharge Medications:  The patient has been discharged on:   1.Beta Blocker:  Yes [ X  ]                              No   [   ]                              If No, reason:  2.Ace Inhibitor/ARB: Yes [   ]                                     No  [  n  ]                                     If No, reason:renal insuff, soft BP  3.Statin:   Yes Arly.Keller   ]                  No  [   ]                  If No, reason:  4.Ecasa:  Yes  [  X ]                  No   [   ]                  If No, reason:     Discharge Instructions    Amb Referral to Cardiac Rehabilitation   Complete by:  As directed    Diagnosis:  CABG   CABG X ___:  3   Amb Referral to Cardiac Rehabilitation   Complete by:  As directed    Referring to [facility] CRP 2   Diagnosis:   CABG STEMI PTCA     CABG X ___:  3     Allergies as of 03/31/2018   No Known Allergies     Medication List    TAKE these medications   amiodarone 200 MG tablet Commonly known as:  PACERONE Take 1 tablet (200 mg total) by mouth 2 (two) times daily.   aspirin 81 MG EC tablet Take 1 tablet (81 mg total) by mouth daily. Start taking on:  04/01/2018   atorvastatin 80 MG tablet Commonly known as:  LIPITOR Take 1 tablet (80 mg total) by mouth daily at 6 PM.   carvedilol 3.125 MG tablet Commonly known as:  COREG Take 1 tablet (3.125 mg total) by mouth 2 (two) times daily with a meal.   clopidogrel 75 MG tablet Commonly known as:  PLAVIX Take 1 tablet (75 mg total) by mouth daily. Start taking on:  04/01/2018   Fish Oil 1000 MG Caps Take 1,000 mg by mouth daily.   furosemide 40 MG tablet Commonly known as:  LASIX Take 1 tablet (40 mg total) by mouth daily.   oxyCODONE 5 MG immediate release tablet Commonly known as:  Oxy IR/ROXICODONE Take 1-2 tablets (5-10 mg total)  by mouth every 6 (six) hours as needed for severe pain.   potassium chloride 10 MEQ tablet Commonly known as:  K-DUR Take 1 tablet (10 mEq total) by mouth daily.            Durable Medical Equipment  (From admission, onward)         Start     Ordered   03/31/18 1052  For home use only DME 4 wheeled rolling walker with seat  Once    Question:  Patient needs a walker to treat with the following condition  Answer:  Weakness generalized   03/31/18 1051         Follow-up Information    Purcell Nails, MD Follow up on 04/26/2018.   Specialty:  Cardiothoracic Surgery Why:  Appointment is at 2:00, please get CXR at 1:30 at [facility] located on first floor of our office building Contact information: [address] [place] [address] [phone]        Tereso Newcomer T, PA-C Follow up  on 04/09/2018.   Specialties:  Cardiology, Physician Assistant Why:  Appointment is at 8:45 Contact information: [address] [place] [address] [phone]        [facility] - Dme Follow up.   Why:  rollator- to be delivered to room prior to discharge Contact information: [address] [place] [address] [phone]        [facility] [place]. Go on 04/05/2018.   Why:  [facility] at [facility] at 8:50am for your hospital follow-up appointment. Contact information: [address], [address] [place] [address] [phone]       [facility] Follow up.   Specialty:  Home Health Services Why:  Home Health Physical Therapy, nursing- they will call you to set up home visits Contact information: [address] [place] [address] [phone]        [facility] Follow up.   Contact information: Call- [phone] if needed for assistance in finding a PCP- can also call number on back of  insurance card for in-network providers          Signed: Rowe Clack PA-C 03/31/2018, 12:38 PM

## 2018-03-25 NOTE — Evaluation (Signed)
Physical Therapy Evaluation Patient Details Name: David [name] MRN: [id number] DOB: 02-10-1941 Today's Date: 03/25/2018   History of Present Illness  Patient with history of cervical DDD admitted with chest pain due to STEMI, now s/p CABG x 3.   Clinical Impression  Patient presents with decreased mobility due to pain and limited activity tolerance as well as issues listed in PT problem list.  He will benefit from skilled PT in the acute setting to allow return home with family support and possibly follow up HHPT.     Follow Up Recommendations Supervision/Assistance - 24 hour;Home health PT    Equipment Recommendations  Rolling [name] with 5" wheels    Recommendations for Other Services       Precautions / Restrictions Precautions Precautions: Fall;Sternal      Mobility  Bed Mobility Overal bed mobility: Needs Assistance Bed Mobility: Supine to Sit     Supine to sit: HOB elevated;Mod assist     General bed mobility comments: assist to scoot hips, cues to bring legs to EOB and assist to lift trunk  Transfers Overall transfer level: Needs assistance Equipment used: 4-wheeled [name](David [name]) Transfers: Sit to/from Stand Sit to Stand: Mod assist         General transfer comment: encouraged pt to hug heart pillow, assist under hips to lift to stand; same to sit in recliner  Ambulation/Gait Ambulation/Gait assistance: Min assist;+2 safety/equipment Gait Distance (Feet): 150 Feet Assistive device: 4-wheeled [name](David [name]) Gait Pattern/deviations: Step-through pattern;Decreased stride length;Trunk flexed     General Gait Details: elevated height of [name], but pt remained flexed for comfort; assist at times to guide walker, RN followed with chair  Stairs            Wheelchair Mobility    Modified Rankin (Stroke Patients Only)       Balance Overall balance assessment: Needs assistance Sitting-balance support: Feet supported Sitting  balance-Leahy Scale: Fair       Standing balance-Leahy Scale: Poor                               Pertinent Vitals/Pain Pain Assessment: 0-10 Pain Score: 5  Pain Location: chest with ambulation Pain Descriptors / Indicators: Aching;Sore Pain Intervention(s): Monitored during session;Repositioned    Home Living Family/patient expects to be discharged to:: Private residence Living Arrangements: Alone;Spouse/significant other(lives alone, but girlfriend to help) Available Help at Discharge: Friend(s) Type of Home: House Home Access: Stairs to enter;Level entry Entrance Stairs-Rails: Doctor, general practice of Steps: 3 steps at girlfriend's house, level at his Home Layout: One level Home Equipment: Shower seat      Prior Function Level of Independence: Independent               Hand Dominance   Dominant Hand: Left    Extremity/Trunk Assessment        Lower Extremity Assessment Lower Extremity Assessment: Overall WFL for tasks assessed(drainage on bed mat from leg incision from vein harvest)       Communication   Communication: No difficulties  Cognition Arousal/Alertness: Awake/alert Behavior During Therapy: WFL for tasks assessed/performed Overall Cognitive Status: Within Functional Limits for tasks assessed                                        General Comments General comments (skin integrity, edema, etc.): family in room, HR  95, SpO1 100% after ambulation (poor signal during on 2L O2); BP 164/76 after ambulation    Exercises     Assessment/Plan    PT Assessment Patient needs continued PT services  PT Problem List Decreased strength;Decreased mobility;Decreased knowledge of precautions;Decreased balance;Decreased knowledge of use of DME;Decreased activity tolerance       PT Treatment Interventions DME instruction;Therapeutic activities;Patient/family education;Therapeutic exercise;Gait training;Functional  mobility training;Balance training;Stair training    PT Goals (Current goals can be found in the Care Plan section)  Acute Rehab PT Goals Patient Stated Goal: to get stronger/go home PT Goal Formulation: With patient/family Time For Goal Achievement: 04/01/18    Frequency Min 3X/week   Barriers to discharge        Co-evaluation               AM-PAC PT "6 Clicks" Daily Activity  Outcome Measure Difficulty turning over in bed (including adjusting bedclothes, sheets and blankets)?: Unable Difficulty moving from lying on back to sitting on the side of the bed? : Unable Difficulty sitting down on and standing up from a chair with arms (e.g., wheelchair, bedside commode, etc,.)?: Unable Help needed moving to and from a bed to chair (including a wheelchair)?: A Lot Help needed walking in hospital room?: A Little Help needed climbing 3-5 steps with a railing? : Total 6 Click Score: 9    End of Session Equipment Utilized During Treatment: Gait belt Activity Tolerance: Patient tolerated treatment well Patient left: with call bell/phone within reach;in chair;with family/visitor present   PT Visit Diagnosis: Difficulty in walking, not elsewhere classified (R26.2);Muscle weakness (generalized) (M62.81)    Time: 2956-2130 PT Time Calculation (min) (ACUTE ONLY): 34 min   Charges:   PT Evaluation $PT Eval Moderate Complexity: 1 Mod PT Treatments $Gait Training: 8-22 mins        Sheran Lawless, [place] [facility] [phone] 03/25/2018   Elray Mcgregor 03/25/2018, 4:48 PM

## 2018-03-26 ENCOUNTER — Inpatient Hospital Stay (HOSPITAL_COMMUNITY): Payer: Medicare Other

## 2018-03-26 LAB — BASIC METABOLIC PANEL
Anion gap: 9 (ref 5–15)
BUN: 62 mg/dL — ABNORMAL HIGH (ref 8–23)
CALCIUM: 7.6 mg/dL — AB (ref 8.9–10.3)
CHLORIDE: 100 mmol/L (ref 98–111)
CO2: 26 mmol/L (ref 22–32)
CREATININE: 1.92 mg/dL — AB (ref 0.61–1.24)
GFR calc Af Amer: 38 mL/min — ABNORMAL LOW (ref >60)
GFR calc non Af Amer: 32 mL/min — ABNORMAL LOW (ref >60)
Glucose, Bld: 205 mg/dL — ABNORMAL HIGH (ref 70–99)
Potassium: 3.4 mmol/L — ABNORMAL LOW (ref 3.5–5.1)
Sodium: 135 mmol/L (ref 135–145)

## 2018-03-26 LAB — COOXEMETRY PANEL
CARBOXYHEMOGLOBIN: 1.3 % (ref 0.5–1.5)
Methemoglobin: 1.5 % (ref 0.0–1.5)
O2 Saturation: 50.6 %
Total hemoglobin: 9.5 g/dL — ABNORMAL LOW (ref 12.0–16.0)

## 2018-03-26 LAB — GLUCOSE, CAPILLARY
GLUCOSE-CAPILLARY: 148 mg/dL — AB (ref 70–99)
GLUCOSE-CAPILLARY: 111 mg/dL — AB (ref 70–99)
Glucose-Capillary: 108 mg/dL — ABNORMAL HIGH (ref 70–99)
Glucose-Capillary: 122 mg/dL — ABNORMAL HIGH (ref 70–99)

## 2018-03-26 LAB — MAGNESIUM: Magnesium: 2.3 mg/dL (ref 1.7–2.4)

## 2018-03-26 LAB — POTASSIUM: Potassium: 3.2 mmol/L — ABNORMAL LOW (ref 3.5–5.1)

## 2018-03-26 MED ORDER — AMIODARONE HCL 200 MG PO TABS
400.0000 mg | ORAL_TABLET | Freq: Two times a day (BID) | ORAL | Status: DC
  Administered 2018-03-26 – 2018-03-31 (×11): 400 mg via ORAL
  Filled 2018-03-26 (×11): qty 2

## 2018-03-26 MED ORDER — LACTULOSE 10 GM/15ML PO SOLN
20.0000 g | Freq: Every day | ORAL | Status: DC | PRN
  Filled 2018-03-26: qty 30

## 2018-03-26 MED ORDER — POTASSIUM CHLORIDE 10 MEQ/50ML IV SOLN
10.0000 meq | INTRAVENOUS | Status: AC
  Administered 2018-03-26 (×4): 10 meq via INTRAVENOUS
  Filled 2018-03-26 (×4): qty 50

## 2018-03-26 MED ORDER — POTASSIUM CHLORIDE CRYS ER 20 MEQ PO TBCR
20.0000 meq | EXTENDED_RELEASE_TABLET | Freq: Two times a day (BID) | ORAL | Status: DC
  Administered 2018-03-27 – 2018-03-28 (×4): 20 meq via ORAL
  Filled 2018-03-26 (×5): qty 1

## 2018-03-26 MED ORDER — METOCLOPRAMIDE HCL 5 MG/ML IJ SOLN
10.0000 mg | Freq: Four times a day (QID) | INTRAMUSCULAR | Status: AC
  Administered 2018-03-26 (×3): 10 mg via INTRAVENOUS
  Filled 2018-03-26 (×4): qty 2

## 2018-03-26 MED ORDER — CARVEDILOL 3.125 MG PO TABS
3.1250 mg | ORAL_TABLET | Freq: Two times a day (BID) | ORAL | Status: DC
  Administered 2018-03-26 – 2018-03-31 (×10): 3.125 mg via ORAL
  Filled 2018-03-26 (×10): qty 1

## 2018-03-26 MED ORDER — POTASSIUM CHLORIDE 10 MEQ/50ML IV SOLN
10.0000 meq | INTRAVENOUS | Status: AC
  Administered 2018-03-26 (×2): 10 meq via INTRAVENOUS
  Filled 2018-03-26 (×2): qty 50

## 2018-03-26 MED ORDER — POLYETHYLENE GLYCOL 3350 17 G PO PACK
17.0000 g | PACK | Freq: Every day | ORAL | Status: DC
  Administered 2018-03-26 – 2018-03-31 (×3): 17 g via ORAL
  Filled 2018-03-26 (×5): qty 1

## 2018-03-26 MED ORDER — METOCLOPRAMIDE HCL 5 MG/ML IJ SOLN
10.0000 mg | Freq: Once | INTRAMUSCULAR | Status: AC
  Administered 2018-03-27: 10 mg via INTRAVENOUS
  Filled 2018-03-26: qty 2

## 2018-03-26 MED ORDER — FUROSEMIDE 40 MG PO TABS
40.0000 mg | ORAL_TABLET | Freq: Two times a day (BID) | ORAL | Status: DC
  Administered 2018-03-27 – 2018-03-28 (×4): 40 mg via ORAL
  Filled 2018-03-26 (×4): qty 1

## 2018-03-26 NOTE — Progress Notes (Signed)
Physical Therapy Treatment Patient Details Name: David [name] MRN: [id number] DOB: 16-Feb-1941 Today's Date: 03/26/2018    History of Present Illness Patient with history of cervical DDD admitted with chest pain due to STEMI, now s/p CABG x 3.     PT Comments    Pt making steady progress. If he continues to progress and has assistance at home expect return home.    Follow Up Recommendations  Supervision/Assistance - 24 hour;Home health PT     Equipment Recommendations  Rolling walker with 5" wheels;Other (comment)(vs rollator)    Recommendations for Other Services       Precautions / Restrictions Precautions Precautions: Fall;Sternal Restrictions Weight Bearing Restrictions: Yes(sternal precautions)    Mobility  Bed Mobility Overal bed mobility: Needs Assistance Bed Mobility: Sidelying to Sit   Sidelying to sit: Mod assist;HOB elevated       General bed mobility comments: assist to elevate trunk into sitting. Verbal cues for technique and for sternal precautions  Transfers Overall transfer level: Needs assistance Equipment used: 4-wheeled walker Transfers: Sit to/from Stand Sit to Stand: Min assist         General transfer comment: Assist to bring hips up and for balance. Verbal cues for hand placement on knees  Ambulation/Gait Ambulation/Gait assistance: Min assist Gait Distance (Feet): 250 Feet Assistive device: 4-wheeled walker Gait Pattern/deviations: Step-through pattern;Decreased stride length;Trunk flexed Gait velocity: decr Gait velocity interpretation: 1.31 - 2.62 ft/sec, indicative of limited community ambulator General Gait Details: Assist for balance. Verbal cues to stand more erect.   Stairs             Wheelchair Mobility    Modified Rankin (Stroke Patients Only)       Balance Overall balance assessment: Needs assistance Sitting-balance support: Feet supported;No upper extremity supported Sitting balance-Leahy Scale: Fair      Standing balance support: Bilateral upper extremity supported Standing balance-Leahy Scale: Poor Standing balance comment: rollator and min guard for static standing                            Cognition Arousal/Alertness: Awake/alert Behavior During Therapy: WFL for tasks assessed/performed Overall Cognitive Status: Within Functional Limits for tasks assessed                                        Exercises      General Comments        Pertinent Vitals/Pain Pain Assessment: Faces Faces Pain Scale: Hurts a little bit Pain Location: incisional Pain Descriptors / Indicators: Sore;Guarding Pain Intervention(s): Limited activity within patient's tolerance    Home Living                      Prior Function            PT Goals (current goals can now be found in the care plan section) Progress towards PT goals: Progressing toward goals    Frequency    Min 3X/week      PT Plan Current plan remains appropriate    Co-evaluation              AM-PAC PT "6 Clicks" Daily Activity  Outcome Measure  Difficulty turning over in bed (including adjusting bedclothes, sheets and blankets)?: Unable Difficulty moving from lying on back to sitting on the side of the bed? : Unable Difficulty sitting down  on and standing up from a chair with arms (e.g., wheelchair, bedside commode, etc,.)?: Unable Help needed moving to and from a bed to chair (including a wheelchair)?: A Little Help needed walking in hospital room?: A Little Help needed climbing 3-5 steps with a railing? : Total 6 Click Score: 10    End of Session Equipment Utilized During Treatment: Gait belt Activity Tolerance: Patient tolerated treatment well Patient left: in chair;with call bell/phone within reach;with family/visitor present   PT Visit Diagnosis: Difficulty in walking, not elsewhere classified (R26.2);Muscle weakness (generalized) (M62.81)     Time:  1324-4010 PT Time Calculation (min) (ACUTE ONLY): 20 min  Charges:  $Gait Training: 8-22 mins                     [facility] PT Acute Rehabilitation Services Pager [phone] Office [phone]    Angelina Ok [facility] 03/26/2018, 2:37 PM

## 2018-03-26 NOTE — Care Management Note (Addendum)
Case Management Note  Patient Details  Name: Zacarias Krauter MRN: [id number] Date of Birth: 08-14-1940  Subjective/Objective: 77yo male presented from [facility] with acute onset of CP; s/p CABG 10/28.                  Action/Plan: CM met with patient/daughter to discuss transitional needs. Patient lived at home alone and was independent with all ADLs; states having a RW from a previous encounter. Patient reports having Medicare Part [place]; no established PCP; has utilized [facility] for Rx needs. PT recommends HHPT/rollator; patient has no established PCP, but agreeable to CM arranging a hospital f/u appointment. HH/DME preference discussed, with [facility] selected. CM arranged a hospital f/u appointment at: [facility] on 04/05/18 @ 0850 with Roney Jaffe PA; AVS updated. HH/DME referral given to [facility]. Patient will need orders for HHPT/rollator and F2F. Patient will transition home with daughter who will assist with care.   Expected Discharge Date:                  Expected Discharge Plan:  Home/Self Care  In-House Referral:  NA  Discharge planning Services  CM Consult, Follow-up appt scheduled  Post Acute Care Choice:  Durable Medical Equipment, Home Health Choice offered to:  Patient, Adult Children  DME Arranged:  Walker rolling with seat DME Agency:  [facility]:  PT [place]:  [place]  Status of Service:  In process, will continue to follow  If discussed at Long Length of Stay Meetings, dates discussed:    Additional Comments:  Midge Minium RN, BSN, NCM-BC, ACM-RN [phone] 03/26/2018, 3:57 PM

## 2018-03-26 NOTE — Progress Notes (Addendum)
[facility] DAILY ICU PROGRESS NOTE                   [address]            [place] [address]          [phone]   4 Days Post-Op Procedure(s) (LRB): CORONARY ARTERY BYPASS GRAFTING (CABG) x 3. ENDOSCOPIC HARVESTING OF RIGHT GREATER SPAPHENOUS VEIN AND OPEN HARVESTING OF LEFT GREATER SAPHENOUS VEIN. BILATERAL IMA (N/A) TRANSESOPHAGEAL ECHOCARDIOGRAM (TEE) (N/A)  Total Length of Stay:  LOS: 4 days   Subjective:  Patient looks great.  He is feeling much better.  He has ambulated around the unit.  Objective: Vital signs in last 24 hours: Temp:  [98.3 F (36.8 C)-98.6 F (37 C)] 98.6 F (37 C) (11/01 0000) Pulse Rate:  [70-86] 71 (11/01 0700) Cardiac Rhythm: Normal sinus rhythm (10/31 2000) Resp:  [11-26] 17 (11/01 0700) BP: (100-153)/(55-76) 100/59 (11/01 0700) SpO2:  [90 %-98 %] 93 % (11/01 0700) Weight:  [99.5 kg] 99.5 kg (11/01 0500)  Filed Weights   03/24/18 0500 03/25/18 0500 03/26/18 0500  Weight: 103.6 kg 103.1 kg 99.5 kg    Weight change: -3.6 kg    Intake/Output from previous day: 10/31 0701 - 11/01 0700 In: 2238.5 [P.O.:1080; I.V.:814.6; IV Piggyback:343.9] Out: 9147 [Urine:6085; Chest Tube:430]  Current Meds: Scheduled Meds: . acetaminophen  1,000 mg Oral Q6H  . amiodarone  400 mg Oral BID  . aspirin EC  81 mg Oral Daily  . atorvastatin  80 mg Oral q1800  . bisacodyl  10 mg Oral Daily   Or  . bisacodyl  10 mg Rectal Daily  . Chlorhexidine Gluconate Cloth  6 each Topical Daily  . clopidogrel  75 mg Oral Daily  . docusate sodium  200 mg Oral Daily  . enoxaparin (LOVENOX) injection  30 mg Subcutaneous QHS  . insulin aspart  0-24 Units Subcutaneous TID AC & HS  . mouth rinse  15 mL Mouth Rinse BID  . metoCLOPramide (REGLAN) injection  10 mg Intravenous Q6H  . pantoprazole  40 mg Oral Daily  . polyethylene glycol  17 g Oral Daily  . sodium chloride flush  10-40 mL Intracatheter Q12H  . sodium chloride flush  3 mL Intravenous Q12H    Continuous Infusions: . sodium chloride    . sodium chloride    . amiodarone 30 mg/hr (03/26/18 0700)  . furosemide (LASIX) infusion 15 mg/hr (03/26/18 0700)   PRN Meds:.sodium chloride, lactulose, metoprolol tartrate, morphine injection, ondansetron (ZOFRAN) IV, oxyCODONE, sodium chloride flush, sodium chloride flush, traMADol  General appearance: alert, cooperative and no distress Heart: regular rate and rhythm Lungs: clear to auscultation bilaterally Abdomen: soft, non-tender; bowel sounds normal; no masses,  no organomegaly Extremities: edema 1-2+, improved Wound: clean and dry, some serous drainage from [facility] sites, due to swelling  Lab Results: CBC: Recent Labs    03/25/18 0256 03/26/18 0454  WBC 8.2 11.4*  HGB 8.9* 9.4*  HCT 28.5* 29.2*  PLT 91* 149*   BMET:  Recent Labs    03/25/18 1703 03/26/18 0454  NA 136 135  K 3.2* 3.4*  CL 100 100  CO2 23 26  GLUCOSE 126* 205*  BUN 58* 62*  CREATININE 1.89* 1.92*  CALCIUM 7.9* 7.6*    CMET: Lab Results  Component Value Date   WBC 11.4 (H) 03/26/2018   HGB 9.4 (L) 03/26/2018   HCT 29.2 (L) 03/26/2018   PLT 149 (L) 03/26/2018  GLUCOSE 205 (H) 03/26/2018   CHOL 123 03/25/2018   TRIG 175 (H) 03/25/2018   HDL 22 (L) 03/25/2018   LDLCALC 66 03/25/2018   ALT 43 03/22/2018   AST 175 (H) 03/22/2018   NA 135 03/26/2018   K 3.4 (L) 03/26/2018   CL 100 03/26/2018   CREATININE 1.92 (H) 03/26/2018   BUN 62 (H) 03/26/2018   CO2 26 03/26/2018   INR 1.54 03/22/2018   HGBA1C 5.4 03/22/2018      PT/INR: No results for input(s): LABPROT, INR in the last 72 hours. Radiology: No results found.   Assessment/Plan: S/P Procedure(s) (LRB): CORONARY ARTERY BYPASS GRAFTING (CABG) x 3. ENDOSCOPIC HARVESTING OF RIGHT GREATER SPAPHENOUS VEIN AND OPEN HARVESTING OF LEFT GREATER SAPHENOUS VEIN. BILATERAL IMA (N/A) TRANSESOPHAGEAL ECHOCARDIOGRAM (TEE) (N/A)  1. CV- PAF, has been maintaining NSR for > 24 hours, will transition  to oral Amiodarone at 400 mg BID 2. Pulm- off oxygen, continue IS.Marland Kitchen CXR is free from significant pleural effusions, pneumothorax 3. Chest tubes- 350 cc output yesterday, will discuss tentative removal with Dr. Cornelius Moras 4. Renal- creatinine remains stable, good U/O with increased Lasix drip yesterday, can hopefully transition to IV Lasix soon 5. Hypokalemia- repeat IV runs x 6, recheck BMET this evening 6. GI- + abdominal distention, patient denies nausea, BS are present, will give IV reglan for 4 doses, Lactulose prn, start Miralax daily 7. CBGs remain controlled 8. Dispo- patient stable, stop IV Amiodarone, start oral 400 mg BID, CT output 350 cc yesterday, will discuss removal with Dr. Cornelius Moras, good diuresis on Lasix gtt yesterday, creatinine remains stable, can hopefully transition to IV regimen soon, supplement K, watch belly, + distension will give reglan, can hopefully avoid ileus     Lowella Dandy 03/26/2018 8:11 AM    I have seen and examined the patient and agree with the assessment and plan as outlined.  Looks great and feels well.  Maintaining NSR w/ stable BP.  Diuresing very well and creatinine has reached plateau. D/C tubes and Foley.  Convert amiodarone and lasix to oral.  Start low dose beta blocker.  Continue DAPT but will stop Plavix and start Coumadin if PAF recurs.  Mobilize.  Transfer step down.  Purcell Nails, MD 03/26/2018 9:33 AM

## 2018-03-26 NOTE — Progress Notes (Signed)
Patient ambulated with front wheel walker 940 ft in hallway on room air.  Pt tolerated well.

## 2018-03-27 ENCOUNTER — Inpatient Hospital Stay (HOSPITAL_COMMUNITY): Payer: Medicare Other

## 2018-03-27 LAB — CBC
HCT: 29 % — ABNORMAL LOW (ref 39.0–52.0)
HEMATOCRIT: 29.2 % — AB (ref 39.0–52.0)
Hemoglobin: 9.4 g/dL — ABNORMAL LOW (ref 13.0–17.0)
Hemoglobin: 9.3 g/dL — ABNORMAL LOW (ref 13.0–17.0)
MCH: 31.3 pg (ref 26.0–34.0)
MCH: 31 pg (ref 26.0–34.0)
MCHC: 32.2 g/dL (ref 30.0–36.0)
MCHC: 32.1 g/dL (ref 30.0–36.0)
MCV: 97.3 fL (ref 80.0–100.0)
MCV: 96.7 fL (ref 80.0–100.0)
NRBC: 0 % (ref 0.0–0.2)
PLATELETS: 149 10*3/uL — AB (ref 150–400)
PLATELETS: 161 10*3/uL (ref 150–400)
RBC: 3 MIL/uL — ABNORMAL LOW (ref 4.22–5.81)
RBC: 3 MIL/uL — ABNORMAL LOW (ref 4.22–5.81)
RDW: 14.2 % (ref 11.5–15.5)
RDW: 13.7 % (ref 11.5–15.5)
WBC: 11.4 10*3/uL — ABNORMAL HIGH (ref 4.0–10.5)
WBC: 14.3 10*3/uL — ABNORMAL HIGH (ref 4.0–10.5)
nRBC: 0 % (ref 0.0–0.2)

## 2018-03-27 LAB — BASIC METABOLIC PANEL
Anion gap: 5 (ref 5–15)
BUN: 68 mg/dL — AB (ref 8–23)
CALCIUM: 7.7 mg/dL — AB (ref 8.9–10.3)
CO2: 29 mmol/L (ref 22–32)
CREATININE: 1.76 mg/dL — AB (ref 0.61–1.24)
Chloride: 102 mmol/L (ref 98–111)
GFR calc Af Amer: 42 mL/min — ABNORMAL LOW (ref >60)
GFR calc non Af Amer: 36 mL/min — ABNORMAL LOW (ref >60)
Glucose, Bld: 114 mg/dL — ABNORMAL HIGH (ref 70–99)
Potassium: 3.1 mmol/L — ABNORMAL LOW (ref 3.5–5.1)
SODIUM: 136 mmol/L (ref 135–145)

## 2018-03-27 LAB — GLUCOSE, CAPILLARY
GLUCOSE-CAPILLARY: 98 mg/dL (ref 70–99)
Glucose-Capillary: 137 mg/dL — ABNORMAL HIGH (ref 70–99)
Glucose-Capillary: 131 mg/dL — ABNORMAL HIGH (ref 70–99)
Glucose-Capillary: 102 mg/dL — ABNORMAL HIGH (ref 70–99)

## 2018-03-27 MED ORDER — POTASSIUM CHLORIDE CRYS ER 20 MEQ PO TBCR
30.0000 meq | EXTENDED_RELEASE_TABLET | Freq: Once | ORAL | Status: AC
  Administered 2018-03-27: 30 meq via ORAL
  Filled 2018-03-27: qty 1

## 2018-03-27 MED ORDER — LACTULOSE 10 GM/15ML PO SOLN: 20.0000 g | Freq: Once | ORAL | Status: DC

## 2018-03-27 NOTE — Progress Notes (Addendum)
CARDIAC REHAB PHASE I   PRE:  Rate/Rhythm: 86 SR  BP:  Supine:   Sitting: 140/74  Standing:    SaO2: 95 RA  MODE:  Ambulation: 500 ft   POST:  Rate/Rhythm: 117 ST with frequent PAC's or Afib  BP:  Supine:   Sitting: 138/69   Standing:    SaO2: 96 RA 1030-1130 On arrival pt in bed, dressing on left leg had drained down leg and dressing was saturated. All dressing changed on both legs and incisions painted with betadine. Left leg swollen and draining the most. Drainage is serosanguineous . Assisted X 1 to ambulate and used walker. VS stable Pt to recliner after walk with call light in reach.Pt tolerated walk well with two standing rest stops, tired by end of walk. On return from walk pt's HR 117 St with frequent PAC's of A fib. As pt rested HR returned to 80's SR.  Melina Copa RN 03/27/2018 11:35 AM

## 2018-03-27 NOTE — Progress Notes (Addendum)
      [address]       [place] [address]             [phone]        5 Days Post-Op Procedure(s) (LRB): CORONARY ARTERY BYPASS GRAFTING (CABG) x 3. ENDOSCOPIC HARVESTING OF RIGHT GREATER SPAPHENOUS VEIN AND OPEN HARVESTING OF LEFT GREATER SAPHENOUS VEIN. BILATERAL IMA (N/A) TRANSESOPHAGEAL ECHOCARDIOGRAM (TEE) (N/A)  Subjective: Patient with constipation. He states he takes Miralax daily. He is tolerating a diet and denies nausea or vomiting.  Objective: Vital signs in last 24 hours: Temp:  [97.8 F (36.6 C)-98.5 F (36.9 C)] 97.8 F (36.6 C) (11/02 0647) Pulse Rate:  [68-109] 72 (11/02 0021) Cardiac Rhythm: Normal sinus rhythm;Bundle branch block (11/02 0700) Resp:  [12-32] 25 (11/02 0647) BP: (98-137)/(58-80) 121/73 (11/02 0647) SpO2:  [65 %-100 %] 100 % (11/02 0647) Weight:  [98.4 kg] 98.4 kg (11/02 0646)  Pre op weight 96 kg Current Weight  03/27/18 98.4 kg      Intake/Output from previous day: 11/01 0701 - 11/02 0700 In: 1549.2 [P.O.:1122; I.V.:210.5; IV Piggyback:216.7] Out: 3225 [Urine:3175; Chest Tube:50]   Physical Exam:  Cardiovascular: RRR Pulmonary: Slightly diminished at bases Abdomen: Soft, protuberant, non tender, bowel sounds present. Extremities: Mild bilateral lower extremity edema. Wounds: Serosanguinous oozing from [facility] and open leg harvest wounds. No sign of infection. Sternal wound is clean and dry. Ecchymosis bilateral thighs. Slight superficial skin dehiscence middle left thigh wound.  Lab Results: CBC: Recent Labs    03/26/18 0454 03/27/18 0456  WBC 11.4* 14.3*  HGB 9.4* 9.3*  HCT 29.2* 29.0*  PLT 149* 161   BMET:  Recent Labs    03/26/18 0454 03/26/18 2232 03/27/18 0456  NA 135  --  136  K 3.4* 3.2* 3.1*  CL 100  --  102  CO2 26  --  29  GLUCOSE 205*  --  114*  BUN 62*  --  68*  CREATININE 1.92*  --  1.76*  CALCIUM 7.6*  --  7.7*    PT/INR:  Lab Results  Component Value Date   INR 1.54  03/22/2018   INR 1.26 03/22/2018   ABG:  INR: Will add last result for INR, ABG once components are confirmed Will add last 4 CBG results once components are confirmed  Assessment/Plan:  1. CV - S/p STEMI. Prior PAF. First degree heart block, maintaining SR.On Amiodarone 400 mg bid, Carvedilol 3.125 mg bid, and Plavix 75 mg daily.  2.  Pulmonary - On room air. CXR this am appears to show no pneumothorax, bowel gas/distention.Encourage incentive spirometer. 3. AKI-creatinine decreased to 1.76. On Lasix so will monitor 4.  Acute blood loss anemia - H and H stable at 9.3 and 29. 5. Thrombocytopenia resolved -platelets this am 161,000 6. Gently supplement potassium 7. Volume overload-on Lasix 40 mg bid 8. CBGs 111/122/98. Pre op HGA1C 5.4. No histoyr of DM. Will stop accu checks and SS PRN 9. Miralax daily for constipation;will give Lactulose this afternoon if no bowel movement. Also, on Reglan 10. Remove EPW 11. Deconditioned-continue with ambulation  Donielle M ZimmermanPA-C 03/27/2018,8:16 AM [phone]   Chart reviewed, patient examined, agree with above. Abdomen is distended with tinkling bowel sounds. Wife says he just had a " blow out" a little while ago. He say that was a relief.

## 2018-03-27 NOTE — Progress Notes (Signed)
Pt called staff member into room was sitting in chair pt stated he had slipped to his knees in bathroom did not hit his head pt inspected pt's body no issues found called Dr Laneta Simmers no ne orders VSS called pt's daughter to notify bed alarm on pt instructed to call before getting up

## 2018-03-28 LAB — BASIC METABOLIC PANEL
Anion gap: 6 (ref 5–15)
BUN: 65 mg/dL — AB (ref 8–23)
CO2: 29 mmol/L (ref 22–32)
Calcium: 7.8 mg/dL — ABNORMAL LOW (ref 8.9–10.3)
Chloride: 100 mmol/L (ref 98–111)
Creatinine, Ser: 1.68 mg/dL — ABNORMAL HIGH (ref 0.61–1.24)
GFR, EST AFRICAN AMERICAN: 44 mL/min — AB (ref >60)
GFR, EST NON AFRICAN AMERICAN: 38 mL/min — AB (ref >60)
Glucose, Bld: 122 mg/dL — ABNORMAL HIGH (ref 70–99)
POTASSIUM: 3.2 mmol/L — AB (ref 3.5–5.1)
SODIUM: 135 mmol/L (ref 135–145)

## 2018-03-28 LAB — GLUCOSE, CAPILLARY
GLUCOSE-CAPILLARY: 100 mg/dL — AB (ref 70–99)
Glucose-Capillary: 119 mg/dL — ABNORMAL HIGH (ref 70–99)

## 2018-03-28 MED ORDER — LEVALBUTEROL HCL 0.63 MG/3ML IN NEBU
0.6300 mg | INHALATION_SOLUTION | Freq: Once | RESPIRATORY_TRACT | Status: AC
  Administered 2018-03-28: 0.63 mg via RESPIRATORY_TRACT
  Filled 2018-03-28 (×2): qty 3

## 2018-03-28 MED ORDER — LIDOCAINE HCL (PF) 1 % IJ SOLN
INTRAMUSCULAR | Status: AC
  Filled 2018-03-28: qty 5

## 2018-03-28 MED ORDER — CHLORPROMAZINE HCL 10 MG PO TABS
10.0000 mg | ORAL_TABLET | Freq: Every day | ORAL | Status: DC | PRN
  Filled 2018-03-28: qty 1

## 2018-03-28 MED ORDER — ACETAMINOPHEN 500 MG PO TABS
1000.0000 mg | ORAL_TABLET | Freq: Four times a day (QID) | ORAL | Status: DC
  Administered 2018-03-28 – 2018-03-31 (×10): 1000 mg via ORAL
  Filled 2018-03-28 (×10): qty 2

## 2018-03-28 MED ORDER — CHLORPROMAZINE HCL 10 MG PO TABS
10.0000 mg | ORAL_TABLET | Freq: Two times a day (BID) | ORAL | Status: DC | PRN
  Administered 2018-03-28: 10 mg via ORAL
  Filled 2018-03-28 (×2): qty 1

## 2018-03-28 MED ORDER — LEVALBUTEROL HCL 0.63 MG/3ML IN NEBU: 0.6300 mg | INHALATION_SOLUTION | Freq: Three times a day (TID) | RESPIRATORY_TRACT | Status: DC | PRN

## 2018-03-28 NOTE — Progress Notes (Addendum)
      [address]       [place] [address]             [phone]        6 Days Post-Op Procedure(s) (LRB): CORONARY ARTERY BYPASS GRAFTING (CABG) x 3. ENDOSCOPIC HARVESTING OF RIGHT GREATER SPAPHENOUS VEIN AND OPEN HARVESTING OF LEFT GREATER SAPHENOUS VEIN. BILATERAL IMA (N/A) TRANSESOPHAGEAL ECHOCARDIOGRAM (TEE) (N/A)  Subjective: Event of last night noted. Patient with bad hiccups.  Objective: Vital signs in last 24 hours: Temp:  [97.9 F (36.6 C)-98.4 F (36.9 C)] 98.2 F (36.8 C) (11/03 0300) Pulse Rate:  [73-81] 75 (11/03 0300) Cardiac Rhythm: Normal sinus rhythm (11/03 0700) Resp:  [17-25] 18 (11/03 0300) BP: (99-113)/(53-68) 104/54 (11/03 0300) SpO2:  [90 %-98 %] 94 % (11/03 0300) Weight:  [97.9 kg] 97.9 kg (11/03 0300)  Pre op weight 96 kg Current Weight  03/28/18 97.9 kg      Intake/Output from previous day: 11/02 0701 - 11/03 0700 In: 120 [P.O.:120] Out: -    Physical Exam:  Cardiovascular: RRR Pulmonary: Slightly diminished at bases Abdomen: Semi soft, slightly less protuberant, non tender, bowel sounds present. Extremities: Mild bilateral lower extremity edema. Wounds: Serosanguinous oozing from [facility] and open leg harvest wounds. No sign of infection. Sternal wound has slight serous drainage middle of incision. Ecchymosis bilateral thighs. Slight superficial skin dehiscence middle left thigh wound.  Lab Results: CBC: Recent Labs    03/26/18 0454 03/27/18 0456  WBC 11.4* 14.3*  HGB 9.4* 9.3*  HCT 29.2* 29.0*  PLT 149* 161   BMET:  Recent Labs    03/27/18 0456 03/28/18 0415  NA 136 135  K 3.1* 3.2*  CL 102 100  CO2 29 29  GLUCOSE 114* 122*  BUN 68* 65*  CREATININE 1.76* 1.68*  CALCIUM 7.7* 7.8*    PT/INR:  Lab Results  Component Value Date   INR 1.54 03/22/2018   INR 1.26 03/22/2018   ABG:  INR: Will add last result for INR, ABG once components are confirmed Will add last 4 CBG results once components are  confirmed  Assessment/Plan:  1. CV - S/p STEMI. Prior PAF. First degree heart block,PVCs,  maintaining SR in the 80's this am.On Amiodarone 400 mg bid, Carvedilol 3.125 mg bid, and Plavix 75 mg daily.  2.  Pulmonary - On room air. Encourage incentive spirometer. 3. AKI-creatinine decreased to 1.76. On Lasix so will monitor 4.  Acute blood loss anemia - Last H and H stable at 9.3 and 29. 5. Volume overload-on Lasix 40 mg bid 6. As discussed with Dr. Laneta Simmers, will place Nylon sutures in left thigh wound. 7. Thorazine for hiccups but will monitor as may make him sleepy 8. Deconditioned-continue with ambulation   Donielle M ZimmermanPA-C 03/28/2018,9:56 AM [phone]    Chart reviewed, patient examined, agree with above. Renal function improving with creat down to 1.68. Continues to diurese. Weight is still 4 lbs over preop.

## 2018-03-28 NOTE — Progress Notes (Addendum)
Patient with superficial dehiscence mid left thigh wound. Betadine, local 1% anesthetic used, then 3-0 Nylon sutures placed. Patient tolerated the procedure well. Dressings (4x4s with tape) then applied. Patient noted to have expiratory wheezing. I have ordered Xopenex now and the Q 8 hours PRN

## 2018-03-29 LAB — BASIC METABOLIC PANEL
Anion gap: 9 (ref 5–15)
BUN: 58 mg/dL — AB (ref 8–23)
CO2: 29 mmol/L (ref 22–32)
Calcium: 7.8 mg/dL — ABNORMAL LOW (ref 8.9–10.3)
Chloride: 100 mmol/L (ref 98–111)
Creatinine, Ser: 1.64 mg/dL — ABNORMAL HIGH (ref 0.61–1.24)
GFR, EST AFRICAN AMERICAN: 46 mL/min — AB (ref >60)
GFR, EST NON AFRICAN AMERICAN: 39 mL/min — AB (ref >60)
Glucose, Bld: 121 mg/dL — ABNORMAL HIGH (ref 70–99)
POTASSIUM: 3.1 mmol/L — AB (ref 3.5–5.1)
SODIUM: 138 mmol/L (ref 135–145)

## 2018-03-29 LAB — CBC
HEMATOCRIT: 25.9 % — AB (ref 39.0–52.0)
HEMOGLOBIN: 8.4 g/dL — AB (ref 13.0–17.0)
MCH: 31.3 pg (ref 26.0–34.0)
MCHC: 32.4 g/dL (ref 30.0–36.0)
MCV: 96.6 fL (ref 80.0–100.0)
NRBC: 0 % (ref 0.0–0.2)
Platelets: 236 10*3/uL (ref 150–400)
RBC: 2.68 MIL/uL — ABNORMAL LOW (ref 4.22–5.81)
RDW: 13.6 % (ref 11.5–15.5)
WBC: 16.7 10*3/uL — AB (ref 4.0–10.5)

## 2018-03-29 MED ORDER — POTASSIUM CHLORIDE CRYS ER 20 MEQ PO TBCR
40.0000 meq | EXTENDED_RELEASE_TABLET | Freq: Three times a day (TID) | ORAL | Status: AC
  Administered 2018-03-29 – 2018-03-31 (×6): 40 meq via ORAL
  Filled 2018-03-29 (×5): qty 2

## 2018-03-29 MED ORDER — FUROSEMIDE 10 MG/ML IJ SOLN
40.0000 mg | Freq: Two times a day (BID) | INTRAMUSCULAR | Status: AC
  Administered 2018-03-29 (×2): 40 mg via INTRAVENOUS
  Filled 2018-03-29 (×2): qty 4

## 2018-03-29 MED ORDER — FUROSEMIDE 40 MG PO TABS
40.0000 mg | ORAL_TABLET | Freq: Two times a day (BID) | ORAL | Status: DC
  Administered 2018-03-30: 40 mg via ORAL
  Filled 2018-03-29: qty 1

## 2018-03-29 MED ORDER — LISINOPRIL 2.5 MG PO TABS
2.5000 mg | ORAL_TABLET | Freq: Every day | ORAL | Status: DC
  Administered 2018-03-29: 2.5 mg via ORAL
  Filled 2018-03-29: qty 1

## 2018-03-29 NOTE — Progress Notes (Signed)
dc'ed pacing wires pt. tolerated well 

## 2018-03-29 NOTE — Progress Notes (Addendum)
[address]       [place] [address]             [phone]      7 Days Post-Op Procedure(s) (LRB): CORONARY ARTERY BYPASS GRAFTING (CABG) x 3. ENDOSCOPIC HARVESTING OF RIGHT GREATER SPAPHENOUS VEIN AND OPEN HARVESTING OF LEFT GREATER SAPHENOUS VEIN. BILATERAL IMA (N/A) TRANSESOPHAGEAL ECHOCARDIOGRAM (TEE) (N/A) Subjective: Sleepy yesterday, hiccups resolved Ambulation improving Maintaining sinus rhythm  Objective: Vital signs in last 24 hours: Temp:  [98.2 F (36.8 C)-99.9 F (37.7 C)] 98.2 F (36.8 C) (11/03 2325) Pulse Rate:  [71-73] 73 (11/03 1915) Cardiac Rhythm: Normal sinus rhythm (11/03 1915) Resp:  [14-28] 28 (11/04 0225) BP: (92-132)/(53-70) 101/56 (11/03 2325) SpO2:  [92 %-99 %] 92 % (11/03 2325) Weight:  [96.2 kg] 96.2 kg (11/04 0225)  Hemodynamic parameters for last 24 hours:    Intake/Output from previous day: 11/03 0701 - 11/04 0700 In: 481 [P.O.:481] Out: 450 [Urine:450] Intake/Output this shift: No intake/output data recorded.  General appearance: alert, cooperative and no distress Heart: regular rate and rhythm and no reub or murmur Lungs: some scattered end exp wheeze, bibas atx Abdomen: benign Extremities: + edema Wound: some serosang drainage from LL incisions  Lab Results: Recent Labs    03/27/18 0456 03/29/18 0345  WBC 14.3* 16.7*  HGB 9.3* 8.4*  HCT 29.0* 25.9*  PLT 161 236   BMET:  Recent Labs    03/28/18 0415 03/29/18 0345  NA 135 138  K 3.2* 3.1*  CL 100 100  CO2 29 29  GLUCOSE 122* 121*  BUN 65* 58*  CREATININE 1.68* 1.64*  CALCIUM 7.8* 7.8*    PT/INR: No results for input(s): LABPROT, INR in the last 72 hours. ABG    Component Value Date/Time   PHART 7.354 03/23/2018 0356   HCO3 21.7 03/23/2018 0356   TCO2 22 03/23/2018 1520   ACIDBASEDEF 3.0 (H) 03/23/2018 0356   O2SAT 50.6 03/26/2018 0449   CBG (last 3)  Recent Labs    03/27/18 2201 03/28/18 0618 03/28/18 2034  GLUCAP 102*  100* 119*    Meds Scheduled Meds: . acetaminophen  1,000 mg Oral Q6H  . amiodarone  400 mg Oral BID  . aspirin EC  81 mg Oral Daily  . atorvastatin  80 mg Oral q1800  . bisacodyl  10 mg Oral Daily   Or  . bisacodyl  10 mg Rectal Daily  . carvedilol  3.125 mg Oral BID WC  . clopidogrel  75 mg Oral Daily  . docusate sodium  200 mg Oral Daily  . enoxaparin (LOVENOX) injection  30 mg Subcutaneous QHS  . furosemide  40 mg Oral BID  . mouth rinse  15 mL Mouth Rinse BID  . pantoprazole  40 mg Oral Daily  . polyethylene glycol  17 g Oral Daily  . potassium chloride  20 mEq Oral BID  . sodium chloride flush  10-40 mL Intracatheter Q12H  . sodium chloride flush  3 mL Intravenous Q12H   Continuous Infusions: . sodium chloride    . sodium chloride 250 mL (03/26/18 1124)   PRN Meds:.sodium chloride, chlorproMAZINE, lactulose, levalbuterol, metoprolol tartrate, morphine injection, ondansetron (ZOFRAN) IV, oxyCODONE, sodium chloride flush, sodium chloride flush, traMADol  Xrays No results found.  Assessment/Plan: S/P Procedure(s) (LRB): CORONARY ARTERY BYPASS GRAFTING (CABG) x 3. ENDOSCOPIC HARVESTING OF RIGHT GREATER SPAPHENOUS VEIN AND OPEN HARVESTING OF LEFT GREATER SAPHENOUS VEIN. BILATERAL IMA (N/A) TRANSESOPHAGEAL ECHOCARDIOGRAM (TEE) (N/A)  1 doing well 2 Hemodyn stable with some low relative systolic BP readings(90-low 100's)- no ACE-I or ARB yet. Maintaining sinus on Amio/Coreg 3 sats ok on RA- push pulm toilet for atx, may have some mild degree of bronchospasm with minor scattered wheeze 4 Replace K+ 5 creat rel stable- cont lasix for volume overload- current BID,may be able to reduce soon, UOP not accurate 6 some increase in leukocytosis, low grade temp- monitor closely, especially for dev of cellulitis in LE's 7 BS adeq control. A1c is 5.4 8 H/H dropped a bit, monitor, probably dilution component / if drops further may need to guiac stools  LOS: 7 days    Rowe Clack  [facility] 03/29/2018 Pager [phone]  I have seen and examined the patient and agree with the assessment and plan as outlined.  Maintaining NSR.  BP stable but soft.  Will continue low dose beta blocker and start low dose ACE-I.  Still looks volume overloaded and baseline weight unclear.  Possible d/c home 1-2 days depending on progress.  Purcell Nails, MD 03/29/2018 9:03 AM

## 2018-03-29 NOTE — Progress Notes (Signed)
CARDIAC REHAB PHASE I   Pt seen ambulating in hallway with daughter and front wheel walker. Pt states he feels good today. Pt ambulated 452ft. Discharge education completed with pt and family. Pt instructed to shower daily and monitor incisions. Encouraged continued IS use. Reinforced sternal precautions. Pt given in-the-tube sheet, O[facility] care guide, and heart healthy diet. Reviewed restrictions and exercise guidelines. Will refer to CRP II [place].   [phone] Reynold Bowen, RN BSN 03/29/2018 10:56 AM

## 2018-03-29 NOTE — Progress Notes (Signed)
Ambulated x 300 feet with walker tolerated well

## 2018-03-29 NOTE — Progress Notes (Addendum)
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     PT Comments    Pt doing well with mobility. Plans to go home with daughter.   Follow Up Recommendations  Home health PT;Supervision - Intermittent     Equipment Recommendations  Other (comment)(rollator)    Recommendations for Other Services       Precautions / Restrictions Precautions Precautions: Fall;Sternal    Mobility  Bed Mobility Overal bed mobility: Needs Assistance Bed Mobility: Sidelying to Sit;Sit to Supine   Sidelying to sit: HOB elevated;Supervision   Sit to supine: Supervision   General bed mobility comments: pt pulling on rail to come to sitting despite verbal cues not to  Transfers Overall transfer level: Needs assistance Equipment used: Rolling walker (2 wheeled) Transfers: Sit to/from Stand Sit to Stand: Supervision            Ambulation/Gait Ambulation/Gait assistance: Supervision Gait Distance (Feet): 300 Feet Assistive device: Rolling walker (2 wheeled) Gait Pattern/deviations: Step-through pattern;Decreased stride length;Trunk flexed Gait velocity: decr Gait velocity interpretation: 1.31 - 2.62 ft/sec, indicative of limited community ambulator General Gait Details: Pt took two standing rest breaks.    Stairs Stairs: Yes Stairs assistance: Min guard Stair Management: With walker;Forwards(over portable step) Number of Stairs: 2(1+1)     Wheelchair Mobility    Modified Rankin (Stroke Patients Only)       Balance Overall balance assessment: Needs assistance Sitting-balance support: Feet supported;No upper extremity supported Sitting balance-Leahy Scale: Fair     Standing balance support: No upper extremity supported Standing balance-Leahy Scale: Fair                               Cognition Arousal/Alertness: Awake/alert Behavior During Therapy: WFL for tasks assessed/performed Overall Cognitive Status: Within Functional Limits for tasks assessed                                        Exercises      General Comments        Pertinent Vitals/Pain Pain Assessment: Faces Faces Pain Scale: No hurt    Home Living                      Prior Function            PT Goals (current goals can now be found in the care plan section) Acute Rehab PT Goals PT Goal Formulation: With patient Time For Goal Achievement: 04/01/18 Progress towards PT goals: Goals met and updated - see care plan    Frequency    Min 3X/week      PT Plan Current plan remains appropriate    Co-evaluation              AM-PAC PT "6 Clicks" Daily Activity  Outcome Measure  Difficulty turning over in bed (including adjusting bedclothes, sheets and blankets)?: A Little Difficulty moving from lying on back to sitting on the side of the bed? : A Little Difficulty sitting down on and standing up from a chair with arms (e.g., wheelchair, bedside commode, etc,.)?: A Little Help needed moving to and from a bed to chair (including a wheelchair)?:  None Help needed walking in hospital room?: None Help needed climbing 3-5 steps with a railing? : A Little 6 Click Score: 20    End of Session   Activity Tolerance: Patient tolerated treatment well Patient left: with call bell/phone within reach;in bed Nurse Communication: Mobility status(nurse tech) PT Visit Diagnosis: Difficulty in walking, not elsewhere classified (R26.2);Muscle weakness (generalized) (M62.81)     Time: 6286-3817 PT Time Calculation (min) (ACUTE ONLY): 15 min  Charges:  $Gait Training: 8-22 mins                     [place] Pager [phone] Office [place] 03/29/2018, 4:55 PM

## 2018-03-29 NOTE — Care Management Important Message (Signed)
Important Message  Patient Details  Name: David [name] MRN: [id number] Date of Birth: 01/09/1941   Medicare Important Message Given:  Yes    Oralia Rud Arieon Scalzo 03/29/2018, 4:29 PM

## 2018-03-30 LAB — BASIC METABOLIC PANEL
ANION GAP: 7 (ref 5–15)
BUN: 55 mg/dL — ABNORMAL HIGH (ref 8–23)
CHLORIDE: 102 mmol/L (ref 98–111)
CO2: 31 mmol/L (ref 22–32)
Calcium: 7.8 mg/dL — ABNORMAL LOW (ref 8.9–10.3)
Creatinine, Ser: 1.71 mg/dL — ABNORMAL HIGH (ref 0.61–1.24)
GFR calc non Af Amer: 37 mL/min — ABNORMAL LOW (ref >60)
GFR, EST AFRICAN AMERICAN: 43 mL/min — AB (ref >60)
Glucose, Bld: 101 mg/dL — ABNORMAL HIGH (ref 70–99)
POTASSIUM: 3.2 mmol/L — AB (ref 3.5–5.1)
SODIUM: 140 mmol/L (ref 135–145)

## 2018-03-30 LAB — CBC
HCT: 26.1 % — ABNORMAL LOW (ref 39.0–52.0)
HEMOGLOBIN: 8.2 g/dL — AB (ref 13.0–17.0)
MCH: 30.8 pg (ref 26.0–34.0)
MCHC: 31.4 g/dL (ref 30.0–36.0)
MCV: 98.1 fL (ref 80.0–100.0)
NRBC: 0 % (ref 0.0–0.2)
PLATELETS: 275 10*3/uL (ref 150–400)
RBC: 2.66 MIL/uL — ABNORMAL LOW (ref 4.22–5.81)
RDW: 13.7 % (ref 11.5–15.5)
WBC: 16 10*3/uL — AB (ref 4.0–10.5)

## 2018-03-30 LAB — COOXEMETRY PANEL
Carboxyhemoglobin: 2.1 % — ABNORMAL HIGH (ref 0.5–1.5)
Methemoglobin: 1.6 % — ABNORMAL HIGH (ref 0.0–1.5)
O2 Saturation: 58.6 %
Total hemoglobin: 8.5 g/dL — ABNORMAL LOW (ref 12.0–16.0)

## 2018-03-30 MED ORDER — FUROSEMIDE 10 MG/ML IJ SOLN
40.0000 mg | Freq: Two times a day (BID) | INTRAMUSCULAR | Status: DC
  Administered 2018-03-30 – 2018-03-31 (×2): 40 mg via INTRAVENOUS
  Filled 2018-03-30 (×2): qty 4

## 2018-03-30 NOTE — Progress Notes (Signed)
Patient ambulated 600 ft  in the hallway using front wheel walker tolerated well.

## 2018-03-30 NOTE — Progress Notes (Addendum)
[address]       [place] [address]             [phone]      8 Days Post-Op Procedure(s) (LRB): CORONARY ARTERY BYPASS GRAFTING (CABG) x 3. ENDOSCOPIC HARVESTING OF RIGHT GREATER SPAPHENOUS VEIN AND OPEN HARVESTING OF LEFT GREATER SAPHENOUS VEIN. BILATERAL IMA (N/A) TRANSESOPHAGEAL ECHOCARDIOGRAM (TEE) (N/A) Subjective: conts to feel better overall, no new specific c/o  Objective: Vital signs in last 24 hours: Temp:  [97.9 F (36.6 C)-98.4 F (36.9 C)] 98 F (36.7 C) (11/05 0441) Pulse Rate:  [65-71] 67 (11/05 0441) Cardiac Rhythm: Normal sinus rhythm (11/05 0200) Resp:  [22-26] 26 (11/05 0441) BP: (92-114)/(48-59) 103/56 (11/05 0441) SpO2:  [94 %-100 %] 100 % (11/05 0441) Weight:  [95.8 kg] 95.8 kg (11/05 0441)  Hemodynamic parameters for last 24 hours:    Intake/Output from previous day: 11/04 0701 - 11/05 0700 In: 470 [P.O.:470] Out: -  Intake/Output this shift: No intake/output data recorded.  General appearance: alert, cooperative and no distress Heart: regular rate and rhythm Lungs: min dim in bases Abdomen: benign Extremities: + LE edema Wound: incis healing well, some minor erethema left thigh incis  Lab Results: Recent Labs    03/29/18 0345 03/30/18 0430  WBC 16.7* 16.0*  HGB 8.4* 8.2*  HCT 25.9* 26.1*  PLT 236 275   BMET:  Recent Labs    03/29/18 0345 03/30/18 0430  NA 138 140  K 3.1* 3.2*  CL 100 102  CO2 29 31  GLUCOSE 121* 101*  BUN 58* 55*  CREATININE 1.64* 1.71*  CALCIUM 7.8* 7.8*    PT/INR: No results for input(s): LABPROT, INR in the last 72 hours. ABG    Component Value Date/Time   PHART 7.354 03/23/2018 0356   HCO3 21.7 03/23/2018 0356   TCO2 22 03/23/2018 1520   ACIDBASEDEF 3.0 (H) 03/23/2018 0356   O2SAT 50.6 03/26/2018 0449   CBG (last 3)  Recent Labs    03/27/18 2201 03/28/18 0618 03/28/18 2034  GLUCAP 102* 100* 119*    Meds Scheduled Meds: . acetaminophen  1,000 mg Oral Q6H    . amiodarone  400 mg Oral BID  . aspirin EC  81 mg Oral Daily  . atorvastatin  80 mg Oral q1800  . bisacodyl  10 mg Oral Daily   Or  . bisacodyl  10 mg Rectal Daily  . carvedilol  3.125 mg Oral BID WC  . clopidogrel  75 mg Oral Daily  . docusate sodium  200 mg Oral Daily  . enoxaparin (LOVENOX) injection  30 mg Subcutaneous QHS  . furosemide  40 mg Oral BID  . lisinopril  2.5 mg Oral Daily  . mouth rinse  15 mL Mouth Rinse BID  . pantoprazole  40 mg Oral Daily  . polyethylene glycol  17 g Oral Daily  . potassium chloride  40 mEq Oral TID WC  . sodium chloride flush  10-40 mL Intracatheter Q12H  . sodium chloride flush  3 mL Intravenous Q12H   Continuous Infusions: . sodium chloride    . sodium chloride 250 mL (03/26/18 1124)   PRN Meds:.sodium chloride, chlorproMAZINE, lactulose, levalbuterol, metoprolol tartrate, morphine injection, ondansetron (ZOFRAN) IV, oxyCODONE, sodium chloride flush, sodium chloride flush, traMADol  Xrays No results found.  Assessment/Plan: S/P Procedure(s) (LRB): CORONARY ARTERY BYPASS GRAFTING (CABG) x 3. ENDOSCOPIC HARVESTING OF RIGHT GREATER SPAPHENOUS VEIN AND OPEN HARVESTING OF LEFT GREATER SAPHENOUS VEIN. BILATERAL IMA (  N/A) TRANSESOPHAGEAL ECHOCARDIOGRAM (TEE) (N/A)  1 maintaining  sinus rhythm , one short episode BBB` 2 BP runs rel low 90's-low 100's, with creat bump from 1.64 to 1.71, GFR 37 will stop lisinopril for now 3 remains volume overloaded , UOP not accurately recorded but weight conts to decline Some of LE edema is probably third spacing , there could be some utility to obtaining an echocardiogram .  4 replace K+  5 leukocytosis stable, cont to monitor incisions closely, no fevers 6 H/H is stable 7 cont routine pulm toilet and rehab  LOS: 8 days   Addendum- d/w Dr Cornelius Moras will obtain echo and Coox  Rowe Clack PA-C 03/30/2018 Pager [phone]   I have seen and examined the patient and agree with the assessment and plan as  outlined.  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  Follow up ECHO pending.  Mr Saladin looks quite good and swelling in both legs is much improved.  Possible d/c home tomorrow.  Purcell Nails, MD 03/30/2018 4:32 PM

## 2018-03-30 NOTE — Progress Notes (Signed)
[phone] Pt has already walked this morning and he stated he is using IS 10 x an hour. Stated daughter has gotten him a walker for home. Discussed CRP 2 and pt and daughter want referral to [facility] CRP 2. No questions re ed done yesterday. Pt stated he will get more walks in today. Luetta Nutting RN BSN 03/30/2018 8:42 AM

## 2018-03-31 ENCOUNTER — Inpatient Hospital Stay (HOSPITAL_COMMUNITY): Payer: Medicare Other

## 2018-03-31 DIAGNOSIS — I5030 Unspecified diastolic (congestive) heart failure: Secondary | ICD-10-CM

## 2018-03-31 DIAGNOSIS — I4891 Unspecified atrial fibrillation: Secondary | ICD-10-CM

## 2018-03-31 DIAGNOSIS — I480 Paroxysmal atrial fibrillation: Secondary | ICD-10-CM

## 2018-03-31 DIAGNOSIS — I9789 Other postprocedural complications and disorders of the circulatory system, not elsewhere classified: Secondary | ICD-10-CM

## 2018-03-31 LAB — ECHOCARDIOGRAM COMPLETE
Height: 71 in
WEIGHTICAEL: 3375.68 [oz_av]

## 2018-03-31 LAB — BASIC METABOLIC PANEL
ANION GAP: 3 — AB (ref 5–15)
BUN: 47 mg/dL — ABNORMAL HIGH (ref 8–23)
CHLORIDE: 104 mmol/L (ref 98–111)
CO2: 33 mmol/L — AB (ref 22–32)
Calcium: 8.2 mg/dL — ABNORMAL LOW (ref 8.9–10.3)
Creatinine, Ser: 1.56 mg/dL — ABNORMAL HIGH (ref 0.61–1.24)
GFR calc non Af Amer: 42 mL/min — ABNORMAL LOW (ref >60)
GFR, EST AFRICAN AMERICAN: 48 mL/min — AB (ref >60)
Glucose, Bld: 100 mg/dL — ABNORMAL HIGH (ref 70–99)
POTASSIUM: 4.2 mmol/L (ref 3.5–5.1)
Sodium: 140 mmol/L (ref 135–145)

## 2018-03-31 MED ORDER — POTASSIUM CHLORIDE ER 10 MEQ PO TBCR: 10.0000 meq | EXTENDED_RELEASE_TABLET | Freq: Every day | ORAL | 0 refills | Status: DC

## 2018-03-31 MED ORDER — ASPIRIN 81 MG PO TBEC: 81.0000 mg | DELAYED_RELEASE_TABLET | Freq: Every day | ORAL | Status: AC

## 2018-03-31 MED ORDER — OXYCODONE HCL 5 MG PO TABS: 5.0000 mg | ORAL_TABLET | Freq: Four times a day (QID) | ORAL | 0 refills | Status: DC | PRN

## 2018-03-31 MED ORDER — ATORVASTATIN CALCIUM 80 MG PO TABS: 80.0000 mg | ORAL_TABLET | Freq: Every day | ORAL | 1 refills | Status: DC

## 2018-03-31 MED ORDER — FUROSEMIDE 40 MG PO TABS: 40.0000 mg | ORAL_TABLET | Freq: Every day | ORAL | 0 refills | Status: DC

## 2018-03-31 MED ORDER — AMIODARONE HCL 200 MG PO TABS: 200.0000 mg | ORAL_TABLET | Freq: Two times a day (BID) | ORAL | 1 refills | Status: DC

## 2018-03-31 MED ORDER — CLOPIDOGREL BISULFATE 75 MG PO TABS: 75.0000 mg | ORAL_TABLET | Freq: Every day | ORAL | 1 refills | Status: DC

## 2018-03-31 MED ORDER — CARVEDILOL 3.125 MG PO TABS: 3.1250 mg | ORAL_TABLET | Freq: Two times a day (BID) | ORAL | 1 refills | Status: DC

## 2018-03-31 NOTE — Discharge Instructions (Signed)
Coronary Artery Bypass Grafting, Care After °These instructions give you information on caring for yourself after your procedure. Your doctor may also give you more specific instructions. Call your doctor if you have any problems or questions after your procedure. °Follow these instructions at home: °· Only take medicine as told by your doctor. Take medicines exactly as told. Do not stop taking medicines or start any new medicines without talking to your doctor first. °· Take your pulse as told by your doctor. °· Do deep breathing as told by your doctor. Use your breathing device (incentive spirometer), if given, to practice deep breathing several times a day. Support your chest with a pillow or your arms when you take deep breaths or cough. °· Keep the area clean, dry, and protected where the surgery cuts (incisions) were made. Remove bandages (dressings) only as told by your doctor. If strips were applied to surgical area, do not take them off. They fall off on their own. °· Check the surgery area daily for puffiness (swelling), redness, or leaking fluid. °· If surgery cuts were made in your legs: °? Avoid crossing your legs. °? Avoid sitting for long periods of time. Change positions every 30 minutes. °? Raise your legs when you are sitting. Place them on pillows. °· Wear stockings that help keep blood clots from forming in your legs (compression stockings). °· Only take sponge baths until your doctor says it is okay to take showers. Pat the surgery area dry. Do not rub the surgery area with a washcloth or towel. Do not bathe, swim, or use a hot tub until your doctor says it is okay. °· Eat foods that are high in fiber. These include raw fruits and vegetables, whole grains, beans, and nuts. Choose lean meats. Avoid canned, processed, and fried foods. °· Drink enough fluids to keep your pee (urine) clear or pale yellow. °· Weigh yourself every day. °· Rest and limit activity as told by your doctor. You may be told  to: °? Stop any activity if you have chest pain, shortness of breath, changes in heartbeat, or dizziness. Get help right away if this happens. °? Move around often for short amounts of time or take short walks as told by your doctor. Gradually become more active. You may need help to strengthen your muscles and build endurance. °? Avoid lifting, pushing, or pulling anything heavier than 10 pounds (4.5 kg) for at least 6 weeks after surgery. °· Do not drive until your doctor says it is okay. °· Ask your doctor when you can go back to work. °· Ask your doctor when you can begin sexual activity again. °· Follow up with your doctor as told. °Contact a doctor if: °· You have puffiness, redness, more pain, or fluid draining from the incision site. °· You have a fever. °· You have puffiness in your ankles or legs. °· You have pain in your legs. °· You gain 2 or more pounds (0.9 kg) a day. °· You feel sick to your stomach (nauseous) or throw up (vomit). °· You have watery poop (diarrhea). °Get help right away if: °· You have chest pain that goes to your jaw or arms. °· You have shortness of breath. °· You have a fast or irregular heartbeat. °· You notice a "clicking" in your breastbone when you move. °· You have numbness or weakness in your arms or legs. °· You feel dizzy or light-headed. °This information is not intended to replace advice given to you by   your health care provider. Make sure you discuss any questions you have with your health care provider. Document Released: 05/17/2013 Document Revised: 10/18/2015 Document Reviewed: 10/19/2012 Elsevier Interactive Patient Education  2017 [employer] Discharge Instructions:  1. You may shower, please wash incisions daily with soap and water and keep dry.  If you wish to cover wounds with dressing you may do so but please keep clean and change daily.  No tub baths or swimming until incisions have completely healed.  If your incisions become red or develop any  drainage please call our office at [phone]  2. No Driving until cleared by Dr. Orvan July office and you are no longer using narcotic pain medications  3. Monitor your weight daily.. Please use the same scale and weigh at same time... If you gain 5-10 lbs in 48 hours with associated lower extremity swelling, please contact our office at [phone]  4. Fever of 101.5 for at least 24 hours with no source, please contact our office at [phone]  5. Activity- up as tolerated, please walk at least 3 times per day.  Avoid strenuous activity, no lifting, pushing, or pulling with your arms over 8-10 lbs for a minimum of 6 weeks  6. If any questions or concerns arise, please do not hesitate to contact our office at [phone]

## 2018-03-31 NOTE — Progress Notes (Signed)
CARDIAC REHAB PHASE I   Pt resting in bed, ambulated in halls without difficulty today. Pt waiting on echo and hopeful to d/c today. Encouraged continued IS use and walks. Reinforced sternal precautions. Pts daughter given some gauze and tape. Instructed to only cover leaking/oozing sites after d/c. Pt referred to CRP II [place].  [id number] Reynold Bowen, RN BSN 03/31/2018 10:05 AM

## 2018-03-31 NOTE — Progress Notes (Addendum)
Progress Note  Patient Name: David [name] Date of Encounter: 03/31/2018  Primary Cardiologist: Dr. Yvonne Kendall, MD   Subjective   Pt feeling well today. Anticipating going home once echo completed. Denies chest pain or palpitations.   Inpatient Medications    Scheduled Meds: . acetaminophen  1,000 mg Oral Q6H  . amiodarone  400 mg Oral BID  . aspirin EC  81 mg Oral Daily  . atorvastatin  80 mg Oral q1800  . bisacodyl  10 mg Oral Daily   Or  . bisacodyl  10 mg Rectal Daily  . carvedilol  3.125 mg Oral BID WC  . clopidogrel  75 mg Oral Daily  . docusate sodium  200 mg Oral Daily  . enoxaparin (LOVENOX) injection  30 mg Subcutaneous QHS  . furosemide  40 mg Intravenous BID  . mouth rinse  15 mL Mouth Rinse BID  . pantoprazole  40 mg Oral Daily  . polyethylene glycol  17 g Oral Daily  . sodium chloride flush  10-40 mL Intracatheter Q12H  . sodium chloride flush  3 mL Intravenous Q12H   Continuous Infusions: . sodium chloride    . sodium chloride 250 mL (03/26/18 1124)   PRN Meds: sodium chloride, chlorproMAZINE, lactulose, levalbuterol, metoprolol tartrate, morphine injection, ondansetron (ZOFRAN) IV, oxyCODONE, sodium chloride flush, sodium chloride flush, traMADol   Vital Signs    Vitals:   03/31/18 0005 03/31/18 0339 03/31/18 0545 03/31/18 0712  BP: 101/60 112/66  91/61  Pulse: 65 68    Resp: 18 (!) 25 (!) 25 19  Temp: 98 F (36.7 C) 97.8 F (36.6 C)    TempSrc: Oral Oral    SpO2: 100% 95%    Weight:   95.7 kg   Height:        Intake/Output Summary (Last 24 hours) at 03/31/2018 0902 Last data filed at 03/31/2018 0600 Gross per 24 hour  Intake 590 ml  Output -  Net 590 ml   Filed Weights   03/29/18 0225 03/30/18 0441 03/31/18 0545  Weight: 96.2 kg 95.8 kg 95.7 kg    Physical Exam   General: Well developed, well nourished, NAD Skin: Warm, dry, intact  Head: Normocephalic, atraumatic, clear, moist mucus membranes. Neck: Negative for  carotid bruits. No JVD Lungs: Crackles in upper lobes bilaterally. No wheezes. Breathing is unlabored. Cardiovascular: RRR with S1 S2. No murmurs, rubs, gallops, or LV heave appreciated. Abdomen: Soft, non-tender, non-distended with normoactive bowel sounds.  No obvious abdominal masses. MSK: Strength and tone appear normal for age. 5/5 in all extremities Extremities: Mild 1 + BLE  edema. No clubbing or cyanosis. DP/PT pulses 2+ bilaterally Neuro: Alert and oriented. No focal deficits. No facial asymmetry. MAE spontaneously. Psych: Responds to questions appropriately with normal affect.    Labs    Chemistry Recent Labs  Lab 03/29/18 0345 03/30/18 0430 03/31/18 0500  NA 138 140 140  K 3.1* 3.2* 4.2  CL 100 102 104  CO2 29 31 33*  GLUCOSE 121* 101* 100*  BUN 58* 55* 47*  CREATININE 1.64* 1.71* 1.56*  CALCIUM 7.8* 7.8* 8.2*  GFRNONAA 39* 37* 42*  GFRAA 46* 43* 48*  ANIONGAP 9 7 3*     Hematology Recent Labs  Lab 03/27/18 0456 03/29/18 0345 03/30/18 0430  WBC 14.3* 16.7* 16.0*  RBC 3.00* 2.68* 2.66*  HGB 9.3* 8.4* 8.2*  HCT 29.0* 25.9* 26.1*  MCV 96.7 96.6 98.1  MCH 31.0 31.3 30.8  MCHC 32.1 32.4 31.4  RDW 13.7 13.6 13.7  PLT 161 236 275   Cardiac EnzymesNo results for input(s): TROPONINI in the last 168 hours. No results for input(s): TROPIPOC in the last 168 hours.  BNPNo results for input(s): BNP, PROBNP in the last 168 hours.   DDimer No results for input(s): DDIMER in the last 168 hours.   Radiology    No results found.  Telemetry    111/06/19 NSR, no recurrent AF on tele- Personally Reviewed  ECG    03/23/18 AF with HR 111 - Personally Reviewed  Cardiac Studies   Cardiac catheterization 03/22/18: Conclusions: 1. Severe three-vessel CAD, as detailed below.  Culprit lesion is acute plaque rupture and thrombotic subtotal occlusion of the mid RCA with TIMI-1 flow.. 2. Moderately elevated left ventricular filling pressure with LVEF of 35 to 45%.  There  is inferior akinesis. 3. Successful PTCA of the mid RCA using a 2.5 x 15 mm balloon with reduction of stenosis from 99% to 30% and restoration of TIMI-3 flow. 4. Successful placement of 50 mL intra-aortic balloon pump via the right common femoral artery.  Recommendations: 1. Patient to go to the operating room for emergent CABG with Dr. Cornelius Moras.  We will continue Cangrelor until he proceeds to the operating room. 2. Aggressive secondary prevention, including high intensity statin therapy.  Recommend uninterrupted dual antiplatelet therapy with Aspirin 81mg  daily and Clopidogrel 75mg  daily for a minimum of 12 months (ACS - Class I recommendation).  Initiation of clopidogrel will be deferred until after CABG.  Echocardiogram 03/23/18: Study Conclusions  - Left ventricle: The cavity size was normal. There was moderate   concentric hypertrophy. Systolic function was normal. The   estimated ejection fraction was in the range of 50% to 55%. There   is akinesis of the mid-apicalinferior myocardium. Left   ventricular diastolic function parameters were normal. - Aortic valve: Trileaflet; mildly thickened, mildly calcified   leaflets. Valve area (VTI): 2.33 cm^2. Valve area (Vmean): 2.42   cm^2. - Aorta: Ascending aortic diameter: 40 mm (S). - Ascending aorta: The ascending aorta was mildly dilated. - Right ventricle: The cavity size was mildly dilated. Wall   thickness was normal.  Patient Profile     77 y.o. male with degenerative disc disease of the cervical spine, transferred from [facility] 03/22/18 due to acute onset of chest pain on day of admission and inferior ST segment elevation found to have severe three vessel CAD with recommendations for surgical revascularization. CABG performed   Assessment & Plan    1. 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   -Cath here  shows severe three-vessel disease; culprit is thrombotic 99% stenosis of the mid RCA. This was successfully angioplastied with restoration of TIMI-3 flow. Given heavy disease burden, decision was made to refer for emergent CABG  X 3 with Dr. Cornelius Moras performed emergently 03/22/18 -Awaiting echocardiogram prior to d/c   -Continue statin, ASA, carvedilol, Plavix>>no room for titration at this time secondary to soft BP  -ACEI/ARB in the OP setting if renal function improves   2. Post-op atrial fibrillation: -Pt initially placed on IV Amiodarone>>>maintaining NSR although continues to have occasional PAF on telemetry  -Will recommed decreasing Amiodarone to 400mg  daily at discharge and follow   -Not currently on anticoagulation given recent surgery   -CHA2DS2VASc =2  3. Acute CHF exacerbation: -Post operative volume excess with unknown baseline weight  -  Will need to be discharged on PO diuretic with recommendation of torsemide 20mg  twice daily and follow closely given fluid volume status on exam today  -CXR 03/27/18 with bibasilar atelectasis and tiny pleural effusions with underlying chronic interstitial disease -LVEF 50-55% with akinesis of the mid-apicalinferior myocardium -Weight, 210lb today>>211lb on admission  -I&O, net negative 1.8L   4. 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   CARDIOLOGY RECOMMENDATIONS:  Discharge is anticipated in the next 48 hours. Recommendations for medications and follow up:  Discharge Medications: Continue medications as they are currently listed in the [facility]. Exceptions to the above:  Decrease amidarone to 400 mg daily from 400 mg bid.  Follow Up: The patient's Primary Cardiologist is No primary care  provider on file.  Follow up in the office in 2 week(s) with Dr. Okey Dupre.  Signed,  Nicki Guadalajara, MD  2:24 PM 03/31/2018  [facility] For questions or updates, please contact   Please consult [email or web address] for contact info under Cardiology/STEMI.

## 2018-03-31 NOTE — Progress Notes (Signed)
  Echocardiogram 2D Echocardiogram has been performed.  David [name] 03/31/2018, 12:53 PM

## 2018-03-31 NOTE — Progress Notes (Addendum)
[address]       [place] [address]             [phone]      9 Days Post-Op Procedure(s) (LRB): CORONARY ARTERY BYPASS GRAFTING (CABG) x 3. ENDOSCOPIC HARVESTING OF RIGHT GREATER SPAPHENOUS VEIN AND OPEN HARVESTING OF LEFT GREATER SAPHENOUS VEIN. BILATERAL IMA (N/A) TRANSESOPHAGEAL ECHOCARDIOGRAM (TEE) (N/A) Subjective: Feels ok, echo pending  Objective: Vital signs in last 24 hours: Temp:  [97.8 F (36.6 C)-98.3 F (36.8 C)] 97.8 F (36.6 C) (11/06 0339) Pulse Rate:  [27-72] 68 (11/06 0339) Cardiac Rhythm: Normal sinus rhythm (11/06 0210) Resp:  [18-26] 19 (11/06 0712) BP: (91-124)/(58-97) 91/61 (11/06 0712) SpO2:  [95 %-100 %] 95 % (11/06 0339) Weight:  [95.7 kg] 95.7 kg (11/06 0545)  Hemodynamic parameters for last 24 hours:    Intake/Output from previous day: 11/05 0701 - 11/06 0700 In: 590 [P.O.:590] Out: -  Intake/Output this shift: No intake/output data recorded.  General appearance: alert, cooperative and no distress Heart: regular rate and rhythm and occas irregular beat Lungs: mildly dim in bases Abdomen: benign Extremities: edema conts to slowly improve Wound: incis stable- no increase in erethema  Lab Results: Recent Labs    03/29/18 0345 03/30/18 0430  WBC 16.7* 16.0*  HGB 8.4* 8.2*  HCT 25.9* 26.1*  PLT 236 275   BMET:  Recent Labs    03/30/18 0430 03/31/18 0500  NA 140 140  K 3.2* 4.2  CL 102 104  CO2 31 33*  GLUCOSE 101* 100*  BUN 55* 47*  CREATININE 1.71* 1.56*  CALCIUM 7.8* 8.2*    PT/INR: No results for input(s): LABPROT, INR in the last 72 hours. ABG    Component Value Date/Time   PHART 7.354 03/23/2018 0356   HCO3 21.7 03/23/2018 0356   TCO2 22 03/23/2018 1520   ACIDBASEDEF 3.0 (H) 03/23/2018 0356   O2SAT 58.6 03/30/2018 1510   CBG (last 3)  Recent Labs    03/28/18 2034  GLUCAP 119*    Meds Scheduled Meds: . acetaminophen  1,000 mg Oral Q6H  . amiodarone  400 mg Oral BID  .  aspirin EC  81 mg Oral Daily  . atorvastatin  80 mg Oral q1800  . bisacodyl  10 mg Oral Daily   Or  . bisacodyl  10 mg Rectal Daily  . carvedilol  3.125 mg Oral BID WC  . clopidogrel  75 mg Oral Daily  . docusate sodium  200 mg Oral Daily  . enoxaparin (LOVENOX) injection  30 mg Subcutaneous QHS  . furosemide  40 mg Intravenous BID  . mouth rinse  15 mL Mouth Rinse BID  . pantoprazole  40 mg Oral Daily  . polyethylene glycol  17 g Oral Daily  . potassium chloride  40 mEq Oral TID WC  . sodium chloride flush  10-40 mL Intracatheter Q12H  . sodium chloride flush  3 mL Intravenous Q12H   Continuous Infusions: . sodium chloride    . sodium chloride 250 mL (03/26/18 1124)   PRN Meds:.sodium chloride, chlorproMAZINE, lactulose, levalbuterol, metoprolol tartrate, morphine injection, ondansetron (ZOFRAN) IV, oxyCODONE, sodium chloride flush, sodium chloride flush, traMADol  Xrays No results found.  Assessment/Plan: S/P Procedure(s) (LRB): CORONARY ARTERY BYPASS GRAFTING (CABG) x 3. ENDOSCOPIC HARVESTING OF RIGHT GREATER SPAPHENOUS VEIN AND OPEN HARVESTING OF LEFT GREATER SAPHENOUS VEIN. BILATERAL IMA (N/A) TRANSESOPHAGEAL ECHOCARDIOGRAM (TEE) (N/A)  1 conts to do well 2 BP a little soft, cont  current RX, creat improved to 1.56 3 sinus rhythm with occ PAC 4 sats good on RA 5 check echo and plan home later today if no new issues, will need some diuretic at discharge 6 will arrange HHN    LOS: 9 days    Rowe Clack PA-C 03/31/2018 Pager [phone]  I have seen and examined the patient and agree with the assessment and plan as outlined.  ECHO still hasn't been done.  Tentatively plan d/c home later today  Purcell Nails, MD 03/31/2018 8:20 AM

## 2018-03-31 NOTE — Progress Notes (Signed)
Patient in a stable condition, discharge education reviewed with patient and his daughter at bedside, they verbalized understanding, tele dc ccmd notified, prescriptions given to patient , patient belongings at bedside, patient to be transported home by his daughter.

## 2018-03-31 NOTE — Progress Notes (Signed)
Instructed patient on procedure. HOB less than 45*. Pt. Held his breath during line removal and pressure held for 5 min, no s/sx of bleeding noted. Pressure drsg. applied and instructed patient to leave in place for 24 hours and to monitor reporting any s/sx of bleeding and to remain in bed for 30 min. Post line removal. Patient and SO VU. Tomasita Morrow, RN VAST

## 2018-03-31 NOTE — Care Management Note (Signed)
Case Management Note Initial CM note completed by Midge Minium RN, BSN, NCM-BC, ACM-RN [phone] 03/26/2018, 3:57 PM   Patient Details  Name: David [name] MRN: [id number] Date of Birth: 02-06-1941  Subjective/Objective: 77yo male presented from OSA with acute onset of CP; s/p CABG 10/28.                  Action/Plan: CM met with patient/daughter to discuss transitional needs. Patient lived at home alone and was independent with all ADLs; states having a RW from a previous encounter. Patient reports having Medicare Part [place]; no established PCP; has utilized [facility] for Rx needs. PT recommends HHPT/rollator; patient has no established PCP, but agreeable to CM arranging a hospital f/u appointment. HH/DME preference discussed, with [facility] selected. CM arranged a hospital f/u appointment at: [facility] on 04/05/18 @ 0850 with Roney Jaffe PA; AVS updated. HH/DME referral given to [facility]. Patient will need orders for HHPT/rollator and F2F. Patient will transition home with daughter who will assist with care.   Expected Discharge Date:  03/31/18               Expected Discharge Plan:  [place]  In-House Referral:  NA  Discharge planning Services  CM Consult, Follow-up appt scheduled  Post Acute Care Choice:  Durable Medical Equipment, Home Health Choice offered to:  Patient, Adult Children  DME Arranged:  [facility] rolling with seat DME Agency:  [place] Arranged:  PT, RN [facility] Agency:  [place]  Status of Service:  Completed, signed off  If discussed at [place] of Stay Meetings, dates discussed:  Discharge Disposition: home/home health   Additional Comments:  03/31/18- 1535- David Schreifels RN, CM- Pt for transition home, plan to stay with Daughter David [name], orders have been placed for HHRN/PT and rollator, have notified Butch Penny with [facility] for rollator-  DME to be delivered to room prior to discharge.   David Patricia, RN 03/31/2018, 3:25 PM Transitions of Care Unit 4E- RN Case Manager [phone]

## 2018-03-31 NOTE — Progress Notes (Signed)
Chest tube sutures removed as ordered.  

## 2018-04-01 DIAGNOSIS — Z48812 Encounter for surgical aftercare following surgery on the circulatory system: Secondary | ICD-10-CM

## 2018-04-05 ENCOUNTER — Encounter: Payer: Self-pay | Admitting: Physician Assistant

## 2018-04-05 ENCOUNTER — Ambulatory Visit (INDEPENDENT_AMBULATORY_CARE_PROVIDER_SITE_OTHER): Payer: Federal, State, Local not specified - PPO | Admitting: Physician Assistant

## 2018-04-05 VITALS — BP 125/67 | HR 74 | Temp 97.8°F | Wt 217.0 lb

## 2018-04-05 DIAGNOSIS — L03116 Cellulitis of left lower limb: Secondary | ICD-10-CM

## 2018-04-05 DIAGNOSIS — I480 Paroxysmal atrial fibrillation: Secondary | ICD-10-CM | POA: Diagnosis not present

## 2018-04-05 DIAGNOSIS — N179 Acute kidney failure, unspecified: Secondary | ICD-10-CM

## 2018-04-05 DIAGNOSIS — I25810 Atherosclerosis of coronary artery bypass graft(s) without angina pectoris: Secondary | ICD-10-CM | POA: Diagnosis not present

## 2018-04-05 DIAGNOSIS — T8131XD Disruption of external operation (surgical) wound, not elsewhere classified, subsequent encounter: Secondary | ICD-10-CM

## 2018-04-05 DIAGNOSIS — Z7689 Persons encountering health services in other specified circumstances: Secondary | ICD-10-CM

## 2018-04-05 DIAGNOSIS — Z951 Presence of aortocoronary bypass graft: Secondary | ICD-10-CM

## 2018-04-05 DIAGNOSIS — D62 Acute posthemorrhagic anemia: Secondary | ICD-10-CM

## 2018-04-05 DIAGNOSIS — S81802D Unspecified open wound, left lower leg, subsequent encounter: Secondary | ICD-10-CM

## 2018-04-05 DIAGNOSIS — Z09 Encounter for follow-up examination after completed treatment for conditions other than malignant neoplasm: Secondary | ICD-10-CM | POA: Diagnosis not present

## 2018-04-05 MED ORDER — DOXYCYCLINE HYCLATE 100 MG PO TABS: 100.0000 mg | ORAL_TABLET | Freq: Two times a day (BID) | ORAL | 0 refills | Status: DC

## 2018-04-05 NOTE — Patient Instructions (Signed)

## 2018-04-05 NOTE — Progress Notes (Signed)
HPI:                                                                David [name] is a 77 y.o. male who presents to [facility]: Primary Care Sports Medicine today to establish care  Current concerns: hospital follow-up, STEMI  This is a pleasant 77 yo M recently hospitalized for STEMI s/p emergent CABG x 3  03/22/18-03/31/18 He has an appt with Cardiology on Friday  He denies any fever, malaise, chest pain, cough or breathing difficulties.  His main concern today is post-op wound of his left leg where he had venous grafting of the saphenous vein. He had wound dehiscence and has sutures in place. Daughter states that there has been yellowish drainage and redness. They currently have wound care 2 days per week through home health.  In terms of his hospital course, he did have some AKI (GFR 42, Scr 1.56), acute blood loss anemia (Hgb 8.2) and peripheral edema. Echo showed reduced EF 50%, pseudonormal LV filling pattern and grade 2 diastolic dysfunction. He is currently on Lasix 40 mg QD.  He is currently living with daughter. Prior to this hospitalization he was living at home alone.   No flowsheet data found.  No flowsheet data found.    No past medical history on file. Past Surgical History:  Procedure Laterality Date  . CORONARY ARTERY BYPASS GRAFT N/A 03/22/2018   Procedure: CORONARY ARTERY BYPASS GRAFTING (CABG) x 3. ENDOSCOPIC HARVESTING OF RIGHT GREATER SPAPHENOUS VEIN AND OPEN HARVESTING OF LEFT GREATER SAPHENOUS VEIN. BILATERAL IMA;  Surgeon: Purcell Nails, MD;  Location: [facility] OR;  Service: Open Heart Surgery;  Laterality: N/A;  . CORONARY/GRAFT ACUTE MI REVASCULARIZATION N/A 03/22/2018   Procedure: Coronary/Graft Acute MI Revascularization;  Surgeon: Yvonne Kendall, MD;  Location: [facility] INVASIVE CV LAB;  Service: Cardiovascular;  Laterality: N/A;  . HERNIA REPAIR    . IABP INSERTION N/A 03/22/2018   Procedure: IABP Insertion;  Surgeon: Yvonne Kendall, MD;  Location: [facility] INVASIVE CV LAB;  Service: Cardiovascular;  Laterality: N/A;  . LEFT HEART CATH AND CORONARY ANGIOGRAPHY N/A 03/22/2018   Procedure: LEFT HEART CATH AND CORONARY ANGIOGRAPHY;  Surgeon: Yvonne Kendall, MD;  Location: [facility] INVASIVE CV LAB;  Service: Cardiovascular;  Laterality: N/A;  . TEE WITHOUT CARDIOVERSION N/A 03/22/2018   Procedure: TRANSESOPHAGEAL ECHOCARDIOGRAM (TEE);  Surgeon: Purcell Nails, MD;  Location: [facility] OR;  Service: Open Heart Surgery;  Laterality: N/A;   Social History   Tobacco Use  . Smoking status: Never Smoker  . Smokeless tobacco: Never Used  Substance Use Topics  . Alcohol use: Not on file   family history is not on file.    ROS: negative except as noted in the HPI  Medications: Current Outpatient Medications  Medication Sig Dispense Refill  . amiodarone (PACERONE) 200 MG tablet Take 1 tablet (200 mg total) by mouth 2 (two) times daily. 60 tablet 1  . aspirin EC 81 MG EC tablet Take 1 tablet (81 mg total) by mouth daily.    Marland Kitchen atorvastatin (LIPITOR) 80 MG tablet Take 1 tablet (80 mg total) by mouth daily at 6 PM. 30 tablet 1  . carvedilol (COREG) 3.125 MG tablet Take 1 tablet (3.125 mg total) by mouth 2 (  two) times daily with a meal. 60 tablet 1  . clopidogrel (PLAVIX) 75 MG tablet Take 1 tablet (75 mg total) by mouth daily. 30 tablet 1  . furosemide (LASIX) 40 MG tablet Take 1 tablet (40 mg total) by mouth daily. 30 tablet 0  . Omega-3 Fatty Acids (FISH OIL) 1000 MG CAPS Take 1,000 mg by mouth daily.    . potassium chloride (K-DUR) 10 MEQ tablet Take 1 tablet (10 mEq total) by mouth daily. 30 tablet 0  . Psyllium (METAMUCIL FIBER PO) Take by mouth.    . oxyCODONE (OXY IR/ROXICODONE) 5 MG immediate release tablet Take 1-2 tablets (5-10 mg total) by mouth every 6 (six) hours as needed for severe pain. 25 tablet 0   No current facility-administered medications for this visit.    No Known Allergies     Objective:  BP 125/67    Pulse 74   Temp 97.8 F (36.6 C) (Oral)   Wt 217 lb (98.4 kg)   SpO2 95%   BMI 30.27 kg/m  Gen:  alert, not ill-appearing, no distress, appropriate for age, obese male HEENT: head normocephalic without obvious abnormality, conjunctiva and cornea clear, trachea midline Pulm: Normal work of breathing, normal phonation, clear to auscultation bilaterally, no wheezes, rales or rhonchi CV: Normal rate, regular rhythm, s1 and s2 distinct, no murmurs, clicks or rubs  Neuro: alert and oriented x 3, no tremor MSK: ambulating with walker, 2+ peripheral edema Skin: sternotomy incision appears to be healing well; wound of medial left upper extremity there nylon sutures in place, there is erythema in induration surrounding the sutures, proximal wound there is superficial dehisence,   Lab Results  Component Value Date   CREATININE 1.56 (H) 03/31/2018   BUN 47 (H) 03/31/2018   NA 140 03/31/2018   K 4.2 03/31/2018   CL 104 03/31/2018   CO2 33 (H) 03/31/2018   Lab Results  Component Value Date   WBC 16.0 (H) 03/30/2018   HGB 8.2 (L) 03/30/2018   HCT 26.1 (L) 03/30/2018   MCV 98.1 03/30/2018   PLT 275 03/30/2018     No results found for this or any previous visit (from the past 72 hour(s)). No results found.    Assessment and Plan: 77 y.o. male with   .[name] was seen today for establish care.  Diagnoses and all orders for this visit:  Hospital discharge follow-up  Encounter to establish care  Coronary artery disease involving coronary bypass graft of native heart without angina pectoris  PAF (paroxysmal atrial fibrillation) (HCC)  S/P CABG x 3  Acute blood loss anemia -     CBC  Acute kidney injury (HCC) -     Renal function panel  Cellulitis of left lower extremity -     Discontinue: doxycycline (VIBRA-TABS) 100 MG tablet; Take 1 tablet (100 mg total) by mouth 2 (two) times daily for 7 days.  Wound of left lower extremity, subsequent encounter -     Ambulatory  referral to Home Health  Postoperative wound dehiscence, subsequent encounter -     Ambulatory referral to Home Health   - Personally reviewed PMH, PSH, PFH, medications, allergies, HM - Personally reviewed discharge summary from recent hospitalization, including recent labs - Will continue to trend serum creatinine. Avoid nephrotoxins.  - Will also trend hemoglobin  - I am concerned he has developed cellulitis at the site of his left saphenous vein graft. Wet-to-dry dressing placed in office today. Starting Doxycycline. New orders for wound care  bid placed - Keep f/u with Cardiology     Patient education and anticipatory guidance given Patient agrees with treatment plan Follow-up in 1 week for wound check or sooner as needed if symptoms worsen or fail to improve  Levonne Hubert PA-C

## 2018-04-06 ENCOUNTER — Telehealth: Payer: Self-pay

## 2018-04-06 LAB — RENAL FUNCTION PANEL
Albumin: 2.9 g/dL — ABNORMAL LOW (ref 3.6–5.1)
BUN/Creatinine Ratio: 26 (calc) — ABNORMAL HIGH (ref 6–22)
BUN: 31 mg/dL — AB (ref 7–25)
CALCIUM: 8.4 mg/dL — AB (ref 8.6–10.3)
CHLORIDE: 104 mmol/L (ref 98–110)
CO2: 28 mmol/L (ref 20–32)
Creat: 1.18 mg/dL (ref 0.70–1.18)
GLUCOSE: 95 mg/dL (ref 65–99)
POTASSIUM: 4.8 mmol/L (ref 3.5–5.3)
Phosphorus: 3.5 mg/dL (ref 2.1–4.3)
SODIUM: 138 mmol/L (ref 135–146)

## 2018-04-06 LAB — CBC
HCT: 25.8 % — ABNORMAL LOW (ref 38.5–50.0)
Hemoglobin: 8.6 g/dL — ABNORMAL LOW (ref 13.2–17.1)
MCH: 31 pg (ref 27.0–33.0)
MCHC: 33.3 g/dL (ref 32.0–36.0)
MCV: 93.1 fL (ref 80.0–100.0)
MPV: 10.9 fL (ref 7.5–12.5)
Platelets: 398 10*3/uL (ref 140–400)
RBC: 2.77 10*6/uL — ABNORMAL LOW (ref 4.20–5.80)
RDW: 13.3 % (ref 11.0–15.0)
WBC: 15.2 10*3/uL — AB (ref 3.8–10.8)

## 2018-04-06 NOTE — Telephone Encounter (Signed)
Patient's daughter contacted and made aware of Dr. Orvan Julywen's changes.

## 2018-04-06 NOTE — Telephone Encounter (Signed)
-----   Message from Purcell Nailslarence H Owen, MD sent at 04/06/2018 12:05 PM EST ----- Ask if he has increased LE swelling and/or SOB Increase lasix 40 mg twice daily Continue to weigh patient daily Decrease salt intake  ----- Message ----- From: Steve RattlerBurgess, Crystian Frith F, RN Sent: 04/06/2018  10:36 AM EST To: Purcell Nailslarence H Owen, MD  S/p CABG x3 03/22/2018  Patient's daughter, Eber JonesCarolyn contacted the office.  [facility] he has gained 6 lbs. In 2 days and to call us.    He is taking his 40 mg Lasix as prescribed, only voiding 5- 6 times daily.  But is also eating not so heart healthy foods, like sausage.  Scheduled to see Cardiology 11/15.  Please advise.  Thanks,  Morrie SheldonAshley

## 2018-04-06 NOTE — Progress Notes (Signed)
Labs show everything is trending in the right direction. Kidney function has improved. Calcium and protein are still a little low, which is expected with major surgery and recent hospitalization.  This should continue to improve.  Consider adding a protein supplement as a snack such as Ensure shakes. Still anemic but hemoglobin has increased compared to 1 week ago.   Still has a little bit of an increased white blood cell count but this has come down compared to a week ago. Plan to recheck labs in 1 month

## 2018-04-08 ENCOUNTER — Encounter: Payer: Self-pay | Admitting: Physician Assistant

## 2018-04-08 DIAGNOSIS — E782 Mixed hyperlipidemia: Secondary | ICD-10-CM | POA: Insufficient documentation

## 2018-04-08 DIAGNOSIS — I5020 Unspecified systolic (congestive) heart failure: Secondary | ICD-10-CM

## 2018-04-08 DIAGNOSIS — E785 Hyperlipidemia, unspecified: Secondary | ICD-10-CM

## 2018-04-08 DIAGNOSIS — I5032 Chronic diastolic (congestive) heart failure: Secondary | ICD-10-CM | POA: Insufficient documentation

## 2018-04-08 DIAGNOSIS — D649 Anemia, unspecified: Secondary | ICD-10-CM | POA: Insufficient documentation

## 2018-04-08 HISTORY — DX: Hyperlipidemia, unspecified: E78.5

## 2018-04-08 HISTORY — DX: Unspecified systolic (congestive) heart failure: I50.20

## 2018-04-08 NOTE — Progress Notes (Signed)
Cardiology Office Note:    Date:  04/09/2018   ID:  David [name], DOB 1940/09/20, MRN [id number]  PCP:  Trixie Dredge, PA-C  Cardiologist:   I will est with Dr. Burt Knack at [facility] office Electrophysiologist:  None   Referring MD: No ref. provider found   Chief Complaint  Patient presents with  . Hospitalization Follow-up    s/p MI >> CABG    History of Present Illness:    David [name] is a 77 y.o. male who was admitted 10/28-11/6 with an inferior ST elevation myocardial infarction.  Cardiac Catheterization demonstrated 3 v coronary artery disease with acute plaque rupture and subtotal occlusion of the mid RCA.  He was placed on IABP and the RCA was treated with POBA.  The patient was referred for emergent coronary artery bypass grafting (LIMA to LAD, Free RIMA to OM, and SVG to PDA) with Dr. Roxy Manns.  His post op course was complicated by atrial fibrillation, significant volume excess, AKI and anemia.  He converted to normal sinus rhythm on Amiodarone.  He developed AKI on Lasix drip.  He was transfused with PRBCs.  Repeat echocardiogram prior to DC demonstrated an EF of 50%, inferior akinesis, grade 2 diastolic dysfunction.    [name] returns for posthospitalization follow-up.  He is here with his daughter.  Since discharge, he has noted a 6 pound weight gain.  His Lasix was just increased to 40 mg twice daily a couple of days ago.  He has not noticed much improvement in his lower extremity swelling.  His swelling has actually worsened in the past couple of days.  He does prefer sleeping on an incline.  This is more so related to his chest soreness than shortness of breath.  However, he does note episodes of paroxysmal nocturnal dyspnea.  He denies significant cough.  Chest soreness is unchanged.  He denies fevers.  He has had some constipation.  Prior CV studies:   The following studies were reviewed today:  Echo 03/31/18 Mild LVH, inf AK, EF 50, Gr 2 DD, Asc  aorta 39 mm (mildly dilated), mild MAC, trivial MR, mild LAE, mild dilated RV, normal RVSF, mild RAE, trivial effusion  Echo 03/23/18 Mod conc LVH, EF 50-55, inf AK, normal diastolic function, Asc aorta 40 mm, mild RV dilation   Cardiac Catheterization 03/22/18 Conclusions: 1. Severe three-vessel CAD, as detailed below.  Culprit lesion is acute plaque rupture and thrombotic subtotal occlusion of the mid RCA with TIMI-1 flow.. 2. Moderately elevated left ventricular filling pressure with LVEF of 35 to 45%.  There is inferior akinesis. 3. Successful PTCA of the mid RCA using a 2.5 x 15 mm balloon with reduction of stenosis from 99% to 30% and restoration of TIMI-3 flow. 4. Successful placement of 50 mL intra-aortic balloon pump via the right common femoral artery. Recommendations: 1. Patient to go to the operating room for emergent CABG with Dr. Roxy Manns.  We will continue Cangrelor until he proceeds to the operating room. 2. Aggressive secondary prevention, including high intensity statin therapy. Recommend uninterrupted dual antiplatelet therapy with Aspirin 37m daily and Clopidogrel 752mdaily for a minimum of 12 months (ACS - Class I recommendation).  Initiation of clopidogrel will be deferred until after CABG.         Past Medical History:  Diagnosis Date  . Chronic diastolic CHF (congestive heart failure) ([facility]11/14/2019   Post CABG volume overload req'd Lasix gtt // complicated by AKI // Echo 03/31/18:  Mild LVH,  inf AK, EF 50, Gr 2 DD, Asc aorta 39 mm (mildly dilated), mild MAC, trivial MR, mild LAE, mild dilated RV, normal RVSF, mild RAE, trivial effusion // Echo 03/23/18: Mod conc LVH, EF 50-55, inf AK, normal diastolic function, Asc aorta 40 mm, mild RV dilation    Surgical Hx: The patient  has a past surgical history that includes Hernia repair; LEFT HEART CATH AND CORONARY ANGIOGRAPHY (N/A, 03/22/2018); IABP Insertion (N/A, 03/22/2018); Coronary/Graft Acute MI Revascularization  (N/A, 03/22/2018); Coronary artery bypass graft (N/A, 03/22/2018); and TEE without cardioversion (N/A, 03/22/2018).   Current Medications: Current Meds  Medication Sig  . amiodarone (PACERONE) 200 MG tablet Take 1 tablet (200 mg total) by mouth 2 (two) times daily.  Marland Kitchen aspirin EC 81 MG EC tablet Take 1 tablet (81 mg total) by mouth daily.  Marland Kitchen atorvastatin (LIPITOR) 80 MG tablet Take 1 tablet (80 mg total) by mouth daily at 6 PM.  . carvedilol (COREG) 3.125 MG tablet Take 1 tablet (3.125 mg total) by mouth 2 (two) times daily with a meal.  . clopidogrel (PLAVIX) 75 MG tablet Take 1 tablet (75 mg total) by mouth daily.  . Omega-3 Fatty Acids (FISH OIL) 1000 MG CAPS Take 1,000 mg by mouth daily.  Marland Kitchen oxyCODONE (OXY IR/ROXICODONE) 5 MG immediate release tablet Take 1-2 tablets (5-10 mg total) by mouth every 6 (six) hours as needed for severe pain.  Marland Kitchen Psyllium (METAMUCIL FIBER PO) Take by mouth.  . [DISCONTINUED] doxycycline (VIBRA-TABS) 100 MG tablet Take 1 tablet (100 mg total) by mouth 2 (two) times daily for 7 days.  . [DISCONTINUED] furosemide (LASIX) 40 MG tablet Take 40 mg by mouth 2 (two) times daily.  . [DISCONTINUED] potassium chloride (K-DUR) 10 MEQ tablet Take 1 tablet (10 mEq total) by mouth daily.     Allergies:   Patient has no known allergies.   Social History   Tobacco Use  . Smoking status: Former Smoker    Types: Cigarettes    Last attempt to quit: 07/03/1977    Years since quitting: 40.7  . Smokeless tobacco: Never Used  Substance Use Topics  . Alcohol use: Never    Frequency: Never  . Drug use: Never     Family Hx: The patient's family history includes Rheum arthritis in his brother and mother.  ROS:   Please see the history of present illness.    Review of Systems  Cardiovascular: Positive for leg swelling.  Respiratory: Positive for shortness of breath.    All other systems reviewed and are negative.   EKGs/Labs/Other Test Reviewed:    EKG:  EKG is  ordered  today.  The ekg ordered today demonstrates normal sinus rhythm, heart rate 72, normal axis, inferior Q waves, anterior Q waves, nonspecific ST-T wave changes, QTC 420  Recent Labs: 03/22/2018: ALT 43 03/26/2018: Magnesium 2.3 04/05/2018: BUN 31; Creat 1.18; Hemoglobin 8.6; Platelets 398; Potassium 4.8; Sodium 138   Recent Lipid Panel Lab Results  Component Value Date/Time   CHOL 123 03/25/2018 02:49 AM   TRIG 175 (H) 03/25/2018 02:49 AM   HDL 22 (L) 03/25/2018 02:49 AM   CHOLHDL 5.6 03/25/2018 02:49 AM   LDLCALC 66 03/25/2018 02:49 AM    Physical Exam:    VS:  BP 120/60   Pulse 77   Ht 5' 11"  (1.803 m)   Wt 214 lb 1.9 oz (97.1 kg)   SpO2 94%   BMI 29.86 kg/m     Wt Readings from Last 3 Encounters:  04/09/18 214 lb 1.9 oz (97.1 kg)  04/05/18 217 lb (98.4 kg)  03/31/18 210 lb 15.7 oz (95.7 kg)     Physical Exam  Constitutional: He is oriented to person, place, and time. He appears well-developed and well-nourished. No distress.  HENT:  Head: Normocephalic and atraumatic.  Eyes: No scleral icterus.  Neck: JVD (JVP ~ 6 cm) present. No thyromegaly present.  Cardiovascular: Normal rate and regular rhythm.  No murmur heard. Pulmonary/Chest: He has no wheezes. He has rales (faint crackles bilat bases).  Abdominal: He exhibits distension.  Musculoskeletal: He exhibits edema (2-3+ bilat LE edema up to thighs).  Lymphadenopathy:    He has no cervical adenopathy.  Neurological: He is alert and oriented to person, place, and time.  Skin: Skin is warm and dry.  Psychiatric: He has a normal mood and affect.    ASSESSMENT & PLAN:    Acute on chronic diastolic CHF (congestive heart failure) (HCC) He is significantly volume overloaded.  He notes that his leg swelling has increased over the last couple of days and he has had a 6 pound weight gain.  He does note paroxysmal nocturnal dyspnea.  His shortness of breath overall is not significantly worsened.  His Furosemide was increased  to 40 mg twice daily couple of days ago.  He has not noticed much improvement since that time.  I have suggested that we change him to torsemide for better diuresis.  If he does not have much success with this, we may need to consider readmission to the hospital for IV diuresis.  Of note, he does have a low albumin and his hemoglobin is still in the 8 range.  This is also helping contribute to his swelling.  -DC Furosemide  -Start Torsemide 20 mg twice daily  -Increase potassium to 10 mEq twice daily  -BMET today, repeat 1 week  -Follow-up with me in 5-7 days  STEMI involving right coronary artery ([place]) Recent admission with inferior ST elevation myocardial infarction treated with balloon angioplasty of the mid RCA and subsequent emergent CABG.  His postoperative course was complicated as noted with volume overload, atrial fibrillation, acute kidney injury and blood loss anemia requiring transfusion with PRBCs.  Echocardiogram did demonstrate EF 50% with moderate diastolic dysfunction.  As noted, he remains volume overloaded.  He is currently living at home with his daughter and is receiving home health physical therapy.  Once home health physical therapy has stopped and he has seen Dr. Roxy Manns, he can start cardiac rehabilitation.    -Continue aspirin, Plavix, statin, beta-blocker.    -Adjust diuretic as noted for volume overload  -Consider adding low-dose ACE inhibitor at follow-up if blood pressure will allow.  -Given Dr. Darnelle Bos transition to [facility], I will have him follow up here with me and Dr. Burt Knack   Postoperative atrial fibrillation [facility]) He remains in sinus rhythm.  At follow-up next week, I will reduce his amiodarone to 200 mg daily.  We will plan on discontinuing amiodarone at some point in the next 2 to 3 months.  Hyperlipidemia, unspecified hyperlipidemia type Continue high-dose statin.  At follow-up next week, I will arrange a follow-up fasting lipid panel and LFTs.  Postoperative  anemia Hemoglobin last week with primary care was improved.   Dispo:  Return in about 1 week (around 04/16/2018) for Close Follow Up, w/ Richardson Dopp, PA-C.   Medication Adjustments/Labs and Tests Ordered: Current medicines are reviewed at length with the patient today.  Concerns regarding medicines are outlined  above.  Tests Ordered: Orders Placed This Encounter  Procedures  . Basic metabolic panel  . Basic metabolic panel  . EKG 12-Lead   Medication Changes: Meds ordered this encounter  Medications  . torsemide (DEMADEX) 20 MG tablet    Sig: Take 1 tablet (20 mg total) by mouth 2 (two) times daily.    Dispense:  180 tablet    Refill:  3  . potassium chloride (K-DUR) 10 MEQ tablet    Sig: Take 1 tablet (10 mEq total) by mouth 2 (two) times daily.    Dispense:  180 tablet    Refill:  3    Signed, Richardson Dopp, PA-C  04/09/2018 1:14 PM    [facility] [place], [place], [place]  [address] Phone: [phone]; Fax: [phone]

## 2018-04-09 ENCOUNTER — Encounter: Payer: Self-pay | Admitting: Physician Assistant

## 2018-04-09 ENCOUNTER — Ambulatory Visit: Payer: Federal, State, Local not specified - PPO | Admitting: Physician Assistant

## 2018-04-09 VITALS — BP 120/60 | HR 77 | Ht 71.0 in | Wt 214.1 lb

## 2018-04-09 DIAGNOSIS — I2111 ST elevation (STEMI) myocardial infarction involving right coronary artery: Secondary | ICD-10-CM | POA: Diagnosis not present

## 2018-04-09 DIAGNOSIS — I9789 Other postprocedural complications and disorders of the circulatory system, not elsewhere classified: Secondary | ICD-10-CM | POA: Diagnosis not present

## 2018-04-09 DIAGNOSIS — D649 Anemia, unspecified: Secondary | ICD-10-CM

## 2018-04-09 DIAGNOSIS — I5033 Acute on chronic diastolic (congestive) heart failure: Secondary | ICD-10-CM | POA: Diagnosis not present

## 2018-04-09 DIAGNOSIS — I4891 Unspecified atrial fibrillation: Secondary | ICD-10-CM

## 2018-04-09 DIAGNOSIS — E785 Hyperlipidemia, unspecified: Secondary | ICD-10-CM | POA: Diagnosis not present

## 2018-04-09 LAB — BASIC METABOLIC PANEL
BUN/Creatinine Ratio: 21 (ref 10–24)
BUN: 26 mg/dL (ref 8–27)
CALCIUM: 8.9 mg/dL (ref 8.6–10.2)
CHLORIDE: 101 mmol/L (ref 96–106)
CO2: 24 mmol/L (ref 20–29)
Creatinine, Ser: 1.24 mg/dL (ref 0.76–1.27)
GFR calc Af Amer: 65 mL/min/{1.73_m2} (ref >59)
GFR, EST NON AFRICAN AMERICAN: 57 mL/min/{1.73_m2} — AB (ref >59)
GLUCOSE: 92 mg/dL (ref 65–99)
POTASSIUM: 4.7 mmol/L (ref 3.5–5.2)
Sodium: 138 mmol/L (ref 134–144)

## 2018-04-09 MED ORDER — TORSEMIDE 20 MG PO TABS: 20.0000 mg | ORAL_TABLET | Freq: Two times a day (BID) | ORAL | 3 refills | Status: DC

## 2018-04-09 MED ORDER — POTASSIUM CHLORIDE ER 10 MEQ PO TBCR: 10.0000 meq | EXTENDED_RELEASE_TABLET | Freq: Two times a day (BID) | ORAL | 3 refills | Status: DC

## 2018-04-09 NOTE — Patient Instructions (Signed)
Medication Instructions:  Your physician has recommended you make the following change in your medication:  1. STOP TAKING LASIX  2. START TORSEMIDE 20 MG TWICE DAILY  3. INCREASE POTASSIUM TO 10 MG TWICE DAILY.  If you need a refill on your cardiac medications before your next appointment, please call your pharmacy.   Lab work: 1. TODAY: BMET  2. LABS TO BE DONE IN 1 WEEK: BMET  If you have labs (blood work) drawn today and your tests are completely normal, you will receive your results only by: Marland Kitchen. MyChart Message (if you have MyChart) OR . A paper copy in the mail If you have any lab test that is abnormal or we need to change your treatment, we will call you to review the results.  Testing/Procedures: NONE  Follow-Up: Your physician recommends that you schedule a follow-up appointment WITH SCOTT WEAVER, PA-C ON 11/20 @ 2:15PM  Any Other Special Instructions Will Be Listed Below (If Applicable).

## 2018-04-11 ENCOUNTER — Encounter: Payer: Self-pay | Admitting: Physician Assistant

## 2018-04-11 DIAGNOSIS — T8131XA Disruption of external operation (surgical) wound, not elsewhere classified, initial encounter: Secondary | ICD-10-CM | POA: Insufficient documentation

## 2018-04-11 DIAGNOSIS — N179 Acute kidney failure, unspecified: Secondary | ICD-10-CM | POA: Insufficient documentation

## 2018-04-11 DIAGNOSIS — D62 Acute posthemorrhagic anemia: Secondary | ICD-10-CM | POA: Insufficient documentation

## 2018-04-11 DIAGNOSIS — L03116 Cellulitis of left lower limb: Secondary | ICD-10-CM | POA: Insufficient documentation

## 2018-04-13 ENCOUNTER — Encounter: Payer: Self-pay | Admitting: Physician Assistant

## 2018-04-13 ENCOUNTER — Ambulatory Visit (INDEPENDENT_AMBULATORY_CARE_PROVIDER_SITE_OTHER): Payer: Federal, State, Local not specified - PPO | Admitting: Physician Assistant

## 2018-04-13 VITALS — BP 109/58 | HR 85 | Temp 97.8°F | Wt 206.0 lb

## 2018-04-13 DIAGNOSIS — S81802D Unspecified open wound, left lower leg, subsequent encounter: Secondary | ICD-10-CM

## 2018-04-13 DIAGNOSIS — I2111 ST elevation (STEMI) myocardial infarction involving right coronary artery: Secondary | ICD-10-CM

## 2018-04-13 DIAGNOSIS — N179 Acute kidney failure, unspecified: Secondary | ICD-10-CM | POA: Diagnosis not present

## 2018-04-13 DIAGNOSIS — D62 Acute posthemorrhagic anemia: Secondary | ICD-10-CM | POA: Diagnosis not present

## 2018-04-13 DIAGNOSIS — Z4802 Encounter for removal of sutures: Secondary | ICD-10-CM

## 2018-04-13 DIAGNOSIS — R0609 Other forms of dyspnea: Secondary | ICD-10-CM | POA: Insufficient documentation

## 2018-04-13 DIAGNOSIS — S81802A Unspecified open wound, left lower leg, initial encounter: Secondary | ICD-10-CM | POA: Insufficient documentation

## 2018-04-13 NOTE — Progress Notes (Signed)
HPI:                                                                David [name] is a 77 y.o. male who presents to [facility] today for wound check  Accompanied by his granddaughter today.  This is a pleasant 77 yo M recently hospitalized for STEMI s/p emergent CABG x 3  03/22/18-03/31/18  In terms of his hospital course, he did have some AKI (GFR 42, Scr 1.56), acute blood loss anemia (Hgb 8.2) and peripheral edema. Echo showed reduced EF 50%, pseudonormal LV filling pattern and grade 2 diastolic dysfunction. He is currently on Lasix 40 mg QD.  He was placed on Doxycycline last week for cellulitis of his left lower extremity at the sight of his saphenous vein graft. His wound care was also increased to twice daily. He is doing well. Denies fever, malaise, wound pain. He endorses some numbness in his left leg.  Also states he is having sleep difficulties. Endorses trouble breathing with lying flat. He is currently sleeping in a recliner with HOB elevated.   Depression screen PHQ 2/9 04/13/2018  Decreased Interest 0  Down, Depressed, Hopeless 0  PHQ - 2 Score 0    No flowsheet data found.    Past Medical History:  Diagnosis Date  . Chronic diastolic CHF (congestive heart failure) ([place]) 04/08/2018   Post CABG volume overload req'd Lasix gtt // complicated by AKI // Echo 03/31/18:  Mild LVH, inf AK, EF 50, Gr 2 DD, Asc aorta 39 mm (mildly dilated), mild MAC, trivial MR, mild LAE, mild dilated RV, normal RVSF, mild RAE, trivial effusion // Echo 03/23/18: Mod conc LVH, EF 50-55, inf AK, normal diastolic function, Asc aorta 40 mm, mild RV dilation    Past Surgical History:  Procedure Laterality Date  . CORONARY ARTERY BYPASS GRAFT N/A 03/22/2018   Procedure: CORONARY ARTERY BYPASS GRAFTING (CABG) x 3. ENDOSCOPIC HARVESTING OF RIGHT GREATER SPAPHENOUS VEIN AND OPEN HARVESTING OF LEFT GREATER SAPHENOUS VEIN. BILATERAL IMA;  Surgeon:  Rexene Alberts, MD;  Location: [facility];  Service: Open Heart Surgery;  Laterality: N/A;  . CORONARY/GRAFT ACUTE MI REVASCULARIZATION N/A 03/22/2018   Procedure: Coronary/Graft Acute MI Revascularization;  Surgeon: Nelva Bush, MD;  Location: [facility];  Service: Cardiovascular;  Laterality: N/A;  . HERNIA REPAIR    . IABP INSERTION N/A 03/22/2018   Procedure: IABP Insertion;  Surgeon: Nelva Bush, MD;  Location: [facility];  Service: Cardiovascular;  Laterality: N/A;  . LEFT HEART CATH AND CORONARY ANGIOGRAPHY N/A 03/22/2018   Procedure: LEFT HEART CATH AND CORONARY ANGIOGRAPHY;  Surgeon: Nelva Bush, MD;  Location: [facility];  Service: Cardiovascular;  Laterality: N/A;  . TEE WITHOUT CARDIOVERSION N/A 03/22/2018   Procedure: TRANSESOPHAGEAL ECHOCARDIOGRAM (TEE);  Surgeon: Rexene Alberts, MD;  Location: [facility];  Service: Open Heart Surgery;  Laterality: N/A;   Social History   Tobacco Use  . Smoking status: Former Smoker    Types: Cigarettes    Last attempt to quit: 07/03/1977    Years since quitting: 40.8  . Smokeless tobacco: Never Used  Substance Use Topics  . Alcohol use: Never    Frequency: Never   family  history includes Rheum arthritis in his brother and mother.    ROS: Review of Systems  Respiratory: Positive for shortness of breath.   Cardiovascular: Positive for orthopnea and leg swelling.  Skin:       + wound  All other systems reviewed and are negative.    Medications: Current Outpatient Medications  Medication Sig Dispense Refill  . amiodarone (PACERONE) 200 MG tablet Take 1 tablet (200 mg total) by mouth 2 (two) times daily. 60 tablet 1  . aspirin EC 81 MG EC tablet Take 1 tablet (81 mg total) by mouth daily.    Marland Kitchen atorvastatin (LIPITOR) 80 MG tablet Take 1 tablet (80 mg total) by mouth daily at 6 PM. 30 tablet 1  . carvedilol (COREG) 3.125 MG tablet Take 1 tablet (3.125 mg total) by mouth 2 (two) times daily with a meal.  60 tablet 1  . clopidogrel (PLAVIX) 75 MG tablet Take 1 tablet (75 mg total) by mouth daily. 30 tablet 1  . Omega-3 Fatty Acids (FISH OIL) 1000 MG CAPS Take 1,000 mg by mouth daily.    Marland Kitchen oxyCODONE (OXY IR/ROXICODONE) 5 MG immediate release tablet Take 1-2 tablets (5-10 mg total) by mouth every 6 (six) hours as needed for severe pain. 25 tablet 0  . potassium chloride (K-DUR) 10 MEQ tablet Take 1 tablet (10 mEq total) by mouth 2 (two) times daily. 180 tablet 3  . Psyllium (METAMUCIL FIBER PO) Take by mouth.    . torsemide (DEMADEX) 20 MG tablet Take 1 tablet (20 mg total) by mouth 2 (two) times daily. 180 tablet 3   No current facility-administered medications for this visit.    No Known Allergies     Objective:  BP (!) 109/58   Pulse 85   Temp 97.8 F (36.6 C) (Oral)   Wt 206 lb (93.4 kg)   SpO2 96%   BMI 28.73 kg/m  Gen:  alert, not ill-appearing, no distress, appropriate for age 77: head normocephalic without obvious abnormality, conjunctiva and cornea clear, trachea midline Pulm: Normal work of breathing, normal phonation, clear to auscultation bilaterally, no wheezes, rales or rhonchi CV: Normal rate, regular rhythm, s1 and s2 distinct, no murmurs, clicks or rubs  Neuro: alert and oriented x 3, no tremor MSK: extremities atraumatic, normal gait and station, 2+ peripheral edema bilaterally Skin: sternotomy incision appears to be healing well; wound of medial left upper extremity there are nylon sutures in place, skin is dry with eschar surrounding some of the sutures; no induration, redness or drainage Psych: appearance casual, cooperative, good eye contact, euthymic mood, affect mood-congruent, speech is articulate, and thought processes clear and goal-directed  Lab Results  Component Value Date   CREATININE 1.24 04/09/2018   BUN 26 04/09/2018   NA 138 04/09/2018   K 4.7 04/09/2018   CL 101 04/09/2018   CO2 24 04/09/2018   Lab Results  Component Value Date   WBC  15.2 (H) 04/05/2018   HGB 8.6 (L) 04/05/2018   HCT 25.8 (L) 04/05/2018   MCV 93.1 04/05/2018   PLT 398 04/05/2018     No results found for this or any previous visit (from the past 72 hour(s)). No results found.    Assessment and Plan: 77 y.o. male with   .Ellis was seen today for follow-up.  Diagnoses and all orders for this visit:  Wound of left lower extremity, subsequent encounter  Acute kidney injury ([place])  Acute blood loss anemia  STEMI involving right coronary artery ([place])  Dyspnea on exertion   Wound of LLE Treated successfully with Doxycyline No evidence of cellulitis or secondary infection on exam today. Wound appears to be healing well 7 nylon sutures removed from left medial thigh Wound was cleansed, debrided and covered with clean dressing  DOE Pulse ox 96% on RA at rest, no tachypnea, no chest pain or palpitations Likely 2/2 anemia and acute on chronic CHF Keep f/u with cardiology tomorrow  AKI Scr trending down, last Scr 1.24, GFR 57 Avoiding nephrotoxins Recheck renal function in 1 month  Acute blood loss anemia Hgb trending up Recheck in 1 month  Patient education and anticipatory guidance given Patient agrees with treatment plan Follow-up in 1 month or sooner as needed if symptoms worsen or fail to improve  Darlyne Russian PA-C

## 2018-04-14 ENCOUNTER — Other Ambulatory Visit: Payer: Federal, State, Local not specified - PPO | Admitting: *Deleted

## 2018-04-14 ENCOUNTER — Encounter: Payer: Self-pay | Admitting: Physician Assistant

## 2018-04-14 ENCOUNTER — Ambulatory Visit: Payer: Federal, State, Local not specified - PPO | Admitting: Physician Assistant

## 2018-04-14 VITALS — BP 108/58 | HR 83 | Ht 71.0 in | Wt 205.4 lb

## 2018-04-14 DIAGNOSIS — I2510 Atherosclerotic heart disease of native coronary artery without angina pectoris: Secondary | ICD-10-CM

## 2018-04-14 DIAGNOSIS — I9789 Other postprocedural complications and disorders of the circulatory system, not elsewhere classified: Secondary | ICD-10-CM | POA: Diagnosis not present

## 2018-04-14 DIAGNOSIS — E785 Hyperlipidemia, unspecified: Secondary | ICD-10-CM | POA: Diagnosis not present

## 2018-04-14 DIAGNOSIS — I5032 Chronic diastolic (congestive) heart failure: Secondary | ICD-10-CM

## 2018-04-14 DIAGNOSIS — I4891 Unspecified atrial fibrillation: Secondary | ICD-10-CM

## 2018-04-14 DIAGNOSIS — I2111 ST elevation (STEMI) myocardial infarction involving right coronary artery: Secondary | ICD-10-CM

## 2018-04-14 DIAGNOSIS — I5033 Acute on chronic diastolic (congestive) heart failure: Secondary | ICD-10-CM

## 2018-04-14 MED ORDER — AMIODARONE HCL 200 MG PO TABS: 200.0000 mg | ORAL_TABLET | Freq: Every day | ORAL | 3 refills | Status: DC

## 2018-04-14 NOTE — Progress Notes (Signed)
Cardiology Office Note:    Date:  04/14/2018   ID:  David [name], DOB [birth year], MRN [id number]  PCP:  Trixie Dredge, PA-C  Cardiologist:  Sherren Mocha, MD  Electrophysiologist:  None   Referring MD: Ottis Stain*   Chief Complaint  Patient presents with  . Follow-up    CHF    History of Present Illness:    David [name] is a 77 y.o. male with coronary artery disease s/p inferior ST elevation myocardial infarction in 02/2018 treated with POBA of an occluded RCA followed by emergent coronary artery bypass grafting (LIMA to LAD, Free RIMA to OM, and SVG to PDA) with Dr. Roxy Manns.  His post op course was complicated by atrial fibrillation, significant volume excess, AKI and anemia.  He converted to normal sinus rhythm on Amiodarone.  He developed AKI on Lasix drip.  He was transfused with PRBCs.  Repeat echocardiogram prior to DC demonstrated an EF of 50%, inferior akinesis, grade 2 diastolic dysfunction.  I saw him in follow up last week.  He remained volume overloaded and changed his Furosemide to Torsemide.     David [name] returns for close follow up on diastolic heart failure.  He is here with his daughter.  His weight is down 9 pounds since his last visit.  He has not had significant shortness of breath.  His chest soreness is improving.  He does have some lower right anterior chest discomfort that he has noticed recently when he takes a walk.  It is typically worse with taking a deep breath.  This occurred gradually.  He has not had any syncope or near syncope.  His leg swelling has improved.  He denies fevers or chills.  Prior CV studies:   The following studies were reviewed today:  Echo 03/31/18 Mild LVH, inf AK, EF 50, Gr 2 DD, Asc aorta 39 mm (mildly dilated), mild MAC, trivial MR, mild LAE, mild dilated RV, normal RVSF, mild RAE, trivial effusion  Echo 03/23/18 Mod conc LVH, EF 50-55, inf AK, normal diastolic function, Asc aorta 40 mm, mild RV  dilation   Cardiac Catheterization 03/22/18 Conclusions: 1. Severe three-vessel CAD, as detailed below. Culprit lesion is acute plaque rupture and thrombotic subtotal occlusion of the mid RCA with TIMI-1 flow.. 2. Moderately elevated left ventricular filling pressure with LVEF of 35 to 45%. There is inferior akinesis. 3. Successful PTCA of the mid RCA using a 2.5 x 15 mm balloon with reduction of stenosis from 99% to 30% and restoration of TIMI-3 flow. 4. Successful placement of 50 mL intra-aortic balloon pump via the right common femoral artery. Recommendations: 1. Patient to go to the operating room for emergent CABG with Dr. Roxy Manns. We will continue Cangrelor until he proceeds to the operating room. 2. Aggressive secondary prevention, including high intensity statin therapy. Recommend uninterrupted dual antiplatelet therapy with Aspirin 36m daily and Clopidogrel 734mdailyfor a minimum of 12 months (ACS - Class I recommendation).Initiation of clopidogrel will be deferred until after CABG.      Past Medical History:  Diagnosis Date  . CAD (coronary artery disease) 03/22/2018   S/p Inf STEMI 10/19 >> Tx with POBA to RCA then emergent CABG (LIMA to LAD, Free RIMA to OM, and SVG to PDA) with Dr. OwRoxy Manns. Chronic diastolic CHF (congestive heart failure) ([facility]   Post CABG volume overload req'd Lasix gtt // complicated by AKI // Echo 03/31/18:  Mild LVH, inf AK, EF 50, Gr 2 DD, Asc aorta  39 mm (mildly dilated), mild MAC, trivial MR, mild LAE, mild dilated RV, normal RVSF, mild RAE, trivial effusion // Echo 03/23/18: Mod conc LVH, EF 50-55, inf AK, normal diastolic function, Asc aorta 40 mm, mild RV dilation   . Hyperlipidemia 04/08/2018  . Postoperative atrial fibrillation (HCC)    Tx with Amiodarone   Surgical Hx: The patient  has a past surgical history that includes Hernia repair; LEFT HEART CATH AND CORONARY ANGIOGRAPHY (N/A, 03/22/2018); IABP Insertion (N/A, 03/22/2018);  Coronary/Graft Acute MI Revascularization (N/A, 03/22/2018); Coronary artery bypass graft (N/A, 03/22/2018); and TEE without cardioversion (N/A, 03/22/2018).   Current Medications: Current Meds  Medication Sig  . acetaminophen (TYLENOL) 325 MG tablet Take 650 mg by mouth every 6 (six) hours as needed for mild pain.  Marland Kitchen aspirin EC 81 MG EC tablet Take 1 tablet (81 mg total) by mouth daily.  Marland Kitchen atorvastatin (LIPITOR) 80 MG tablet Take 1 tablet (80 mg total) by mouth daily at 6 PM.  . carvedilol (COREG) 3.125 MG tablet Take 1 tablet (3.125 mg total) by mouth 2 (two) times daily with a meal.  . clopidogrel (PLAVIX) 75 MG tablet Take 1 tablet (75 mg total) by mouth daily.  . Omega-3 Fatty Acids (FISH OIL) 1000 MG CAPS Take 1,000 mg by mouth daily.  . potassium chloride (K-DUR) 10 MEQ tablet Take 1 tablet (10 mEq total) by mouth 2 (two) times daily.  . Psyllium (METAMUCIL FIBER PO) Take by mouth.  . torsemide (DEMADEX) 20 MG tablet Take 1 tablet (20 mg total) by mouth 2 (two) times daily.  . [DISCONTINUED] amiodarone (PACERONE) 200 MG tablet Take 1 tablet (200 mg total) by mouth 2 (two) times daily.     Allergies:   Patient has no known allergies.   Social History   Tobacco Use  . Smoking status: Former Smoker    Types: Cigarettes    Last attempt to quit: 07/03/1977    Years since quitting: 40.8  . Smokeless tobacco: Never Used  Substance Use Topics  . Alcohol use: Never    Frequency: Never  . Drug use: Never     Family Hx: The patient's family history includes Rheum arthritis in his brother and mother.  ROS:   Please see the history of present illness.    ROS All other systems reviewed and are negative.   EKGs/Labs/Other Test Reviewed:    EKG:  EKG is not ordered today.    Recent Labs: 03/22/2018: ALT 43 03/26/2018: Magnesium 2.3 04/05/2018: Hemoglobin 8.6; Platelets 398 04/09/2018: BUN 26; Creatinine, Ser 1.24; Potassium 4.7; Sodium 138   Recent Lipid Panel Lab Results    Component Value Date/Time   CHOL 123 03/25/2018 02:49 AM   TRIG 175 (H) 03/25/2018 02:49 AM   HDL 22 (L) 03/25/2018 02:49 AM   CHOLHDL 5.6 03/25/2018 02:49 AM   LDLCALC 66 03/25/2018 02:49 AM    Physical Exam:    VS:  BP (!) 108/58   Pulse 83   Ht 5' 11"  (1.803 m)   Wt 205 lb 6.4 oz (93.2 kg)   SpO2 93%   BMI 28.65 kg/m     Wt Readings from Last 3 Encounters:  04/14/18 205 lb 6.4 oz (93.2 kg)  04/13/18 206 lb (93.4 kg)  04/09/18 214 lb 1.9 oz (97.1 kg)     Physical Exam  Constitutional: He is oriented to person, place, and time. He appears well-developed and well-nourished. No distress.  HENT:  Head: Normocephalic and atraumatic.  Eyes: No  scleral icterus.  Neck: No thyromegaly present.  Cardiovascular: Normal rate and regular rhythm.  No murmur heard. Pulmonary/Chest: Effort normal. He has no rales.  Abdominal: Soft.  Musculoskeletal: He exhibits edema (1+ bilat LE edema to the knees).  No presacral edema  Lymphadenopathy:    He has no cervical adenopathy.  Neurological: He is alert and oriented to person, place, and time.  Skin: Skin is warm and dry.  Psychiatric: He has a normal mood and affect.    ASSESSMENT & PLAN:    Chronic diastolic CHF (congestive heart failure) (HCC) EF 50% by echo 03/31/2018 with moderate diastolic dysfunction.  His volume has significantly improved since changing furosemide to torsemide last week.  He is down 9 pounds.  Continue current dose of torsemide.  Obtain follow-up BMET today.  Coronary artery disease involving native coronary artery of native heart without angina pectoris Status post recent inferior ST elevation myocardial infarction treated with balloon angioplasty to the RCA and subsequent emergent CABG.  He continues to progress well.  He has had some lower anterior right chest discomfort.  This seems to be more consistent with musculoskeletal discomfort.  His oxygen saturation is adequate, he is not tachycardic and he denies  shortness of breath.  He knows to contact me if he has worsening symptoms.  Continue current dose of aspirin, Plavix, carvedilol, atorvastatin.  At this point, I am not sure his blood pressure would tolerate the addition of an ACE inhibitor.  Consider adding lisinopril 2.5 mg daily at next visit.  He continues to have home health physical therapy.  I have asked him to contact us if he does not hear from cardiac rehab after he is discharged from home health PT.  He sees Dr. Roxy Manns December 2.  Postoperative atrial fibrillation (HCC) Decrease amiodarone to 200 mg daily.  We can likely discontinue his amiodarone over the next 1 to 2 months.  Hyperlipidemia Continue high-dose statin.  Arrange follow-up fasting lipids and LFTs in 4 to 6 weeks.  Dispo:  Return in about 4 weeks (around 05/12/2018) for Close Follow Up, w/ Richardson Dopp, PA-C.   Medication Adjustments/Labs and Tests Ordered: Current medicines are reviewed at length with the patient today.  Concerns regarding medicines are outlined above.  Tests Ordered: Orders Placed This Encounter  Procedures  . Basic metabolic panel  . Hepatic function panel  . Lipid panel   Medication Changes: Meds ordered this encounter  Medications  . amiodarone (PACERONE) 200 MG tablet    Sig: Take 1 tablet (200 mg total) by mouth daily.    Dispense:  90 tablet    Refill:  3    Signed, Richardson Dopp, PA-C  04/14/2018 3:03 PM    [facility] [address], [place], [place]  [address] Phone: [phone]; Fax: [phone]

## 2018-04-14 NOTE — Patient Instructions (Addendum)
Medication Instructions:  Your physician has recommended you make the following change in your medication: 1. DECREASE AMIODARONE TO 200 MG DAILY.   If you need a refill on your cardiac medications before your next appointment, please call your pharmacy.   Lab work: 1. TODAY: BMET  2. TO BE DONE IN 4-6 WEEKS: FASTING LIPIDS, LFTS  If you have labs (blood work) drawn today and your tests are completely normal, you will receive your results only by: Marland Kitchen. MyChart Message (if you have MyChart) OR . A paper copy in the mail If you have any lab test that is abnormal or we need to change your treatment, we will call you to review the results.  Testing/Procedures: NONE  Follow-Up: At [facility], you and your health needs are our priority.  As part of our continuing mission to provide you with exceptional heart care, we have created designated Provider Care Teams.  These Care Teams include your primary Cardiologist (physician) and Advanced Practice Providers (APPs -  Physician Assistants and Nurse Practitioners) who all work together to provide you with the care you need, when you need it. You will need a follow up appointment in:  4 weeks.   You may see Tonny BollmanMichael Cooper, MD or one of the following Advanced Practice Providers on your designated Care Team: Tereso NewcomerScott Weaver, PA-C   Any Other Special Instructions Will Be Listed Below (If Applicable).

## 2018-04-15 LAB — BASIC METABOLIC PANEL
BUN/Creatinine Ratio: 21 (ref 10–24)
BUN: 33 mg/dL — ABNORMAL HIGH (ref 8–27)
CO2: 26 mmol/L (ref 20–29)
CREATININE: 1.55 mg/dL — AB (ref 0.76–1.27)
Calcium: 9.5 mg/dL (ref 8.6–10.2)
Chloride: 97 mmol/L (ref 96–106)
GFR, EST AFRICAN AMERICAN: 50 mL/min/{1.73_m2} — AB (ref >59)
GFR, EST NON AFRICAN AMERICAN: 43 mL/min/{1.73_m2} — AB (ref >59)
Glucose: 136 mg/dL — ABNORMAL HIGH (ref 65–99)
POTASSIUM: 4.8 mmol/L (ref 3.5–5.2)
SODIUM: 139 mmol/L (ref 134–144)

## 2018-04-19 ENCOUNTER — Telehealth: Payer: Self-pay

## 2018-04-19 DIAGNOSIS — I2510 Atherosclerotic heart disease of native coronary artery without angina pectoris: Secondary | ICD-10-CM

## 2018-04-19 DIAGNOSIS — I5032 Chronic diastolic (congestive) heart failure: Secondary | ICD-10-CM

## 2018-04-19 MED ORDER — TORSEMIDE 20 MG PO TABS: ORAL_TABLET | ORAL | 3 refills | Status: DC

## 2018-04-19 MED ORDER — POTASSIUM CHLORIDE ER 10 MEQ PO TBCR: EXTENDED_RELEASE_TABLET | ORAL | 3 refills | Status: DC

## 2018-04-19 NOTE — Telephone Encounter (Signed)
Spoke with patient who gave verbalized authorization to speak with daughter Eber JonesCarolyn (DPR not on file). Per Lorin PicketScott recommendations to decrease Torsemide to 40 mg on every Mon, Wed, Fri and 20 mg on all other days , decrease K+ to 20 mEq on every Mon, Wed, Fri and 10 mEq on all other days, repeat BMET in 1 week (lab appt on 12/2). Pt daughter states that they will call if weight increases by 3 lbs or more in 1 day or if leg swelling gets worse. She verbalized understanding. Meds have been updated in Epic.

## 2018-04-20 ENCOUNTER — Ambulatory Visit (INDEPENDENT_AMBULATORY_CARE_PROVIDER_SITE_OTHER): Payer: Federal, State, Local not specified - PPO | Admitting: Physician Assistant

## 2018-04-20 ENCOUNTER — Other Ambulatory Visit: Payer: Self-pay | Admitting: Physician Assistant

## 2018-04-20 ENCOUNTER — Encounter: Payer: Self-pay | Admitting: Physician Assistant

## 2018-04-20 ENCOUNTER — Ambulatory Visit (INDEPENDENT_AMBULATORY_CARE_PROVIDER_SITE_OTHER): Payer: Federal, State, Local not specified - PPO

## 2018-04-20 VITALS — BP 144/80 | HR 82 | Temp 97.8°F | Wt 202.0 lb

## 2018-04-20 DIAGNOSIS — I5032 Chronic diastolic (congestive) heart failure: Secondary | ICD-10-CM

## 2018-04-20 DIAGNOSIS — R0609 Other forms of dyspnea: Secondary | ICD-10-CM | POA: Diagnosis not present

## 2018-04-20 DIAGNOSIS — D649 Anemia, unspecified: Secondary | ICD-10-CM | POA: Diagnosis not present

## 2018-04-20 DIAGNOSIS — R0600 Dyspnea, unspecified: Secondary | ICD-10-CM

## 2018-04-20 DIAGNOSIS — J90 Pleural effusion, not elsewhere classified: Secondary | ICD-10-CM

## 2018-04-20 LAB — POCT HEMOGLOBIN: Hemoglobin: 9.2 g/dL — AB (ref 9.5–13.5)

## 2018-04-20 MED ORDER — NONFORMULARY OR COMPOUNDED ITEM: 0 refills | Status: DC

## 2018-04-20 MED ORDER — FERROUS SULFATE ER 47.5 MG PO TBCR: EXTENDED_RELEASE_TABLET | ORAL | 3 refills | Status: DC

## 2018-04-20 NOTE — Progress Notes (Addendum)
HPI:                                                                David [name] is a 77 y.o. male who presents to [facility]: [facility] today for dyspnea  This is a pleasant 77 yo M with PMH significant for recent STEMI s/p CABG (10/19), chronic diastolic HR (EF 52%), post-op anemia, and AKI presenting with breathing difficulties for several weeks. Symptoms are gradually worsening. He states "I cant sleep, can't breathe."  He is sleeping in a recliner and waking up gasping for air States he if he is not propped up in the recliner he will feel like "lungs are filling up" within minutes. He has a right-sided constant chest wall pain and he developed a dry cough today. He was evaluated by his Cardiologist on 04/14/18 He is currently taking Torsemide for CHF and peripheral edema, and his dose was recently adjusted. He has not yet started cardiac rehab    Past Medical History:  Diagnosis Date  . CAD (coronary artery disease) 03/22/2018   S/p Inf STEMI 10/19 >> Tx with POBA to RCA then emergent CABG (LIMA to LAD, Free RIMA to OM, and SVG to PDA) with Dr. Roxy Manns  . Chronic diastolic CHF (congestive heart failure) ([place]) 04/08/2018   Post CABG volume overload req'd Lasix gtt // complicated by AKI // Echo 03/31/18:  Mild LVH, inf AK, EF 50, Gr 2 DD, Asc aorta 39 mm (mildly dilated), mild MAC, trivial MR, mild LAE, mild dilated RV, normal RVSF, mild RAE, trivial effusion // Echo 03/23/18: Mod conc LVH, EF 50-55, inf AK, normal diastolic function, Asc aorta 40 mm, mild RV dilation   . Hyperlipidemia 04/08/2018  . Postoperative atrial fibrillation (HCC)    Tx with Amiodarone   Past Surgical History:  Procedure Laterality Date  . CORONARY ARTERY BYPASS GRAFT N/A 03/22/2018   Procedure: CORONARY ARTERY BYPASS GRAFTING (CABG) x 3. ENDOSCOPIC HARVESTING OF RIGHT GREATER SPAPHENOUS VEIN AND OPEN HARVESTING OF LEFT GREATER SAPHENOUS VEIN. BILATERAL IMA;  Surgeon:  Rexene Alberts, MD;  Location: [facility];  Service: Open Heart Surgery;  Laterality: N/A;  . CORONARY/GRAFT ACUTE MI REVASCULARIZATION N/A 03/22/2018   Procedure: Coronary/Graft Acute MI Revascularization;  Surgeon: Nelva Bush, MD;  Location: [facility];  Service: Cardiovascular;  Laterality: N/A;  . HERNIA REPAIR    . IABP INSERTION N/A 03/22/2018   Procedure: IABP Insertion;  Surgeon: Nelva Bush, MD;  Location: [facility];  Service: Cardiovascular;  Laterality: N/A;  . LEFT HEART CATH AND CORONARY ANGIOGRAPHY N/A 03/22/2018   Procedure: LEFT HEART CATH AND CORONARY ANGIOGRAPHY;  Surgeon: Nelva Bush, MD;  Location: [facility];  Service: Cardiovascular;  Laterality: N/A;  . TEE WITHOUT CARDIOVERSION N/A 03/22/2018   Procedure: TRANSESOPHAGEAL ECHOCARDIOGRAM (TEE);  Surgeon: Rexene Alberts, MD;  Location: [facility];  Service: Open Heart Surgery;  Laterality: N/A;   Social History   Tobacco Use  . Smoking status: Former Smoker    Types: Cigarettes    Last attempt to quit: 07/03/1977    Years since quitting: 40.8  . Smokeless tobacco: Never Used  Substance Use Topics  . Alcohol use: Never    Frequency: Never  family history includes Rheum arthritis in his brother and mother.    ROS: negative except as noted in the HPI  Medications: Current Outpatient Medications  Medication Sig Dispense Refill  . acetaminophen (TYLENOL) 325 MG tablet Take 650 mg by mouth every 6 (six) hours as needed for mild pain.    Marland Kitchen amiodarone (PACERONE) 200 MG tablet Take 1 tablet (200 mg total) by mouth daily. 90 tablet 3  . aspirin EC 81 MG EC tablet Take 1 tablet (81 mg total) by mouth daily.    Marland Kitchen atorvastatin (LIPITOR) 80 MG tablet Take 1 tablet (80 mg total) by mouth daily at 6 PM. 30 tablet 1  . carvedilol (COREG) 3.125 MG tablet Take 1 tablet (3.125 mg total) by mouth 2 (two) times daily with a meal. 60 tablet 1  . clopidogrel (PLAVIX) 75 MG tablet Take 1 tablet (75  mg total) by mouth daily. 30 tablet 1  . Ferrous Sulfate 47.5 MG TBCR 1 tab PO tid with meals on Monday, Wednesday, Friday 60 tablet 3  . NONFORMULARY OR COMPOUNDED ITEM Home oxygen 2 L/min continuous with equipment as required. Include portable oxygen concentrator if covered 1 each 0  . Omega-3 Fatty Acids (FISH OIL) 1000 MG CAPS Take 1,000 mg by mouth daily.    . potassium chloride (K-DUR) 10 MEQ tablet Take 20 mEq on every Mon, Wed, Fri and 10 mEq on Tues, Thurs, Sat, Sun by mouth 120 tablet 3  . Psyllium (METAMUCIL FIBER PO) Take by mouth.    . torsemide (DEMADEX) 20 MG tablet Take 40 mg on Mon, Wed, Fri and 20 mg Tues, Thur, Sat, Sun by mouth 120 tablet 3   No current facility-administered medications for this visit.    No Known Allergies     Objective:  BP (!) 144/80   Pulse 82   Temp 97.8 F (36.6 C) (Oral)   Wt 202 lb (91.6 kg)   SpO2 98%   BMI 28.17 kg/m  Gen:  alert, not ill-appearing, no distress, appropriate for age 71: head normocephalic without obvious abnormality, conjunctiva and cornea clear, trachea midline Pulm: Normal work of breathing, normal phonation, breath sounds slightly coarse and diminished at hte bases bilaterally CV: Normal rate, regular rhythm, s1 and s2 distinct, no murmurs, clicks or rubs  Neuro: alert and oriented x 3, no tremor MSK: extremities atraumatic, normal gait and station, 2+ peripheral edema bilaterally Skin: intact, no rashes on exposed skin, no jaundice, no cyanosis  Wt Readings from Last 3 Encounters:  04/20/18 202 lb (91.6 kg)  04/14/18 205 lb 6.4 oz (93.2 kg)  04/13/18 206 lb (93.4 kg)     6 min walk test Baseline SpO2 98% on RA at rest, HR 82 SpO2 with exertion on RA 93%, HR 95   Lab Results  Component Value Date   WBC 15.2 (H) 04/05/2018   HGB 9.2 (A) 04/20/2018   HCT 25.8 (L) 04/05/2018   MCV 93.1 04/05/2018   PLT 398 04/05/2018   Lab Results  Component Value Date   CREATININE 1.55 (H) 04/14/2018   BUN 33 (H)  04/14/2018   NA 139 04/14/2018   K 4.8 04/14/2018   CL 97 04/14/2018   CO2 26 04/14/2018     Results for orders placed or performed in visit on 04/20/18 (from the past 72 hour(s))  POCT hemoglobin     Status: Abnormal   Collection Time: 04/20/18  3:08 PM  Result Value Ref Range   Hemoglobin 9.2 (A) 9.5 -  13.5 g/dL   Dg Chest 2 View  Result Date: 04/20/2018 CLINICAL DATA:  Shortness of breath.  Heart failure. EXAM: CHEST - 2 VIEW COMPARISON:  Chest x-ray dated March 27, 2018. FINDINGS: Interval removal of the right upper extremity PICC line. Stable cardiomegaly status post CABG. Normal pulmonary vascularity. Chronic interstitial changes again noted. Increasing small left pleural effusion and left lower lobe atelectasis. Unchanged trace right pleural effusion. No pneumothorax. No acute osseous abnormality. IMPRESSION: 1. Enlarging small left pleural effusion with associated atelectasis. Unchanged trace right pleural effusion. Electronically Signed   By: Titus Dubin M.D.   On: 04/20/2018 15:19      Assessment and Plan: 77 y.o. male with   .Abdirahman was seen today for cough and shortness of breath.  Diagnoses and all orders for this visit:  Chronic diastolic CHF (congestive heart failure) ([place]) -     DG Chest 2 View -     NONFORMULARY OR COMPOUNDED ITEM; Home oxygen 2 L/min continuous with equipment as required. Include portable oxygen concentrator if covered  Dyspnea on exertion -     DG Chest 2 View  PND (paroxysmal nocturnal dyspnea) -     NONFORMULARY OR COMPOUNDED ITEM; Home oxygen 2 L/min continuous with equipment as required. Include portable oxygen concentrator if covered  Postoperative anemia -     Ferrous Sulfate 47.5 MG TBCR; 1 tab PO tid with meals on Monday, Wednesday, Friday -     POCT hemoglobin   Afebrile, no tachypnea, no tachycardia, pulse ox 98% on RA at rest Weight is overall down 4 pounds in 1 week Patient passed his 6 minute walk test today, pulse  ox maintained at 93% I am concerned about his sx of PND and orthopnea CXR pending to assess for pulmonary edema Ordering overnight pulse oximetry for CHF to see if we can get home supplemental O2 approved I have contacted [employer] to arrange the study Will also route note to Cardiology as FYI  POC Hgb 9.2, trending upward We discussed that this is also likely contributing to DOE symptoms Starting ferrous sulfate tid on M, W, F  Patient education and anticipatory guidance given Patient agrees with treatment plan Follow-up as needed if symptoms worsen or fail to improve  Darlyne Russian PA-C

## 2018-04-20 NOTE — Patient Instructions (Signed)
Heart Failure Heart failure is a condition in which the heart has trouble pumping blood because it has become weak or stiff. This means that the heart does not pump blood efficiently for the body to work well. For some people with heart failure, fluid may back up into the lungs and there may be swelling (edema) in the lower legs. Heart failure is usually a long-term (chronic) condition. It is important for you to take good care of yourself and follow the treatment plan from your health care provider. What are the causes? This condition is caused by some health problems, including:  High blood pressure (hypertension). Hypertension causes the heart muscle to work harder than normal. High blood pressure eventually causes the heart to become stiff and weak.  Coronary artery disease (CAD). CAD is the buildup of cholesterol and fat (plaques) in the arteries of the heart.  Heart attack (myocardial infarction). Injured tissue, which is caused by the heart attack, does not contract as well and the heart's ability to pump blood is weakened.  Abnormal heart valves. When the heart valves do not open and close properly, the heart muscle must pump harder to keep the blood flowing.  Heart muscle disease (cardiomyopathy or myocarditis). Heart muscle disease is damage to the heart muscle from a variety of causes, such as drug or alcohol abuse, infections, or unknown causes. These can increase the risk of heart failure.  Lung disease. When the lungs do not work properly, the heart must work harder.  What increases the risk? Risk of heart failure increases as a person ages. This condition is also more likely to develop in people who:  Are overweight.  Are male.  Smoke or chew tobacco.  Abuse alcohol or illegal drugs.  Have taken medicines that can damage the heart, such as chemotherapy drugs.  Have diabetes. ? High blood sugar (glucose) is associated with high fat (lipid) levels in the blood. ? Diabetes  can also damage tiny blood vessels that carry nutrients to the heart muscle.  Have abnormal heart rhythms.  Have thyroid problems.  Have low blood counts (anemia).  What are the signs or symptoms? Symptoms of this condition include:  Shortness of breath with activity, such as when climbing stairs.  Persistent cough.  Swelling of the feet, ankles, legs, or abdomen.  Unexplained weight gain.  Difficulty breathing when lying flat (orthopnea).  Waking from sleep because of the need to sit up and get more air.  Rapid heartbeat.  Fatigue and loss of energy.  Feeling light-headed, dizzy, or close to fainting.  Loss of appetite.  Nausea.  Increased urination during the night (nocturia).  Confusion.  How is this diagnosed? This condition is diagnosed based on:  Medical history, symptoms, and a physical exam.  Diagnostic tests, which may include: ? Echocardiogram. ? Electrocardiogram (ECG). ? Chest X-ray. ? Blood tests. ? Exercise stress test. ? Radionuclide scans. ? Cardiac catheterization and angiogram.  How is this treated? Treatment for this condition is aimed at managing the symptoms of heart failure. Medicines, behavioral changes, or other treatments may be necessary to treat heart failure. Medicines These may include:  Angiotensin-converting enzyme (ACE) inhibitors. This type of medicine blocks the effects of a blood protein called angiotensin-converting enzyme. ACE inhibitors relax (dilate) the blood vessels and help to lower blood pressure.  Angiotensin receptor blockers (ARBs). This type of medicine blocks the actions of a blood protein called angiotensin. ARBs dilate the blood vessels and help to lower blood pressure.  Water   pills (diuretics). Diuretics cause the kidneys to remove salt and water from the blood. The extra fluid is removed through urination, leaving a lower volume of blood that the heart has to pump.  Beta blockers. These improve heart  muscle strength and they prevent the heart from beating too quickly.  Digoxin. This increases the force of the heartbeat.  Healthy behavior changes These may include:  Reaching and maintaining a healthy weight.  Stopping smoking or chewing tobacco.  Eating heart-healthy foods.  Limiting or avoiding alcohol.  Stopping use of street drugs (illegal drugs).  Physical activity.  Other treatments These may include:  Surgery to open blocked coronary arteries or repair damaged heart valves.  Placement of a biventricular pacemaker to improve heart muscle function (cardiac resynchronization therapy). This device paces both the right ventricle and left ventricle.  Placement of a device to treat serious abnormal heart rhythms (implantable cardioverter defibrillator, or ICD).  Placement of a device to improve the pumping ability of the heart (left ventricular assist device, or LVAD).  Heart transplant. This can cure heart failure, and it is considered for certain patients who do not improve with other therapies.  Follow these instructions at home: Medicines  Take over-the-counter and prescription medicines only as told by your health care provider. Medicines are important in reducing the workload of your heart, slowing the progression of heart failure, and improving your symptoms. ? Do not stop taking your medicine unless your health care provider told you to do that. ? Do not skip any dose of medicine. ? Refill your prescriptions before you run out of medicine. You need your medicines every day. Eating and drinking   Eat heart-healthy foods. Talk with a dietitian to make an eating plan that is right for you. ? Choose foods that contain no trans fat and are low in saturated fat and cholesterol. Healthy choices include fresh or frozen fruits and vegetables, fish, lean meats, legumes, fat-free or low-fat dairy products, and whole-grain or high-fiber foods. ? Limit salt (sodium) if  directed by your health care provider. Sodium restriction may reduce symptoms of heart failure. Ask a dietitian to recommend heart-healthy seasonings. ? Use healthy cooking methods instead of frying. Healthy methods include roasting, grilling, broiling, baking, poaching, steaming, and stir-frying.  Limit your fluid intake if directed by your health care provider. Fluid restriction may reduce symptoms of heart failure. Lifestyle  Stop smoking or using chewing tobacco. Nicotine and tobacco can damage your heart and your blood vessels. Do not use nicotine gum or patches before talking to your health care provider.  Limit alcohol intake to no more than 1 drink per day for non-pregnant women and 2 drinks per day for men. One drink equals 12 oz of beer, 5 oz of wine, or 1 oz of hard liquor. ? Drinking more than that is harmful to your heart. Tell your health care provider if you drink alcohol several times a week. ? Talk with your health care provider about whether any level of alcohol use is safe for you. ? If your heart has already been damaged by alcohol or you have severe heart failure, drinking alcohol should be stopped completely.  Stop use of illegal drugs.  Lose weight if directed by your health care provider. Weight loss may reduce symptoms of heart failure.  Do moderate physical activity if directed by your health care provider. People who are elderly and people with severe heart failure should consult with a health care provider for physical activity recommendations.   Monitor important information  Weigh yourself every day. Keeping track of your weight daily helps you to notice excess fluid sooner. ? Weigh yourself every morning after you urinate and before you eat breakfast. ? Wear the same amount of clothing each time you weigh yourself. ? Record your daily weight. Provide your health care provider with your weight record.  Monitor and record your blood pressure as told by your health  care provider.  Check your pulse as told by your health care provider. Dealing with extreme temperatures  If the weather is extremely hot: ? Avoid vigorous physical activity. ? Use air conditioning or fans or seek a cooler location. ? Avoid caffeine and alcohol. ? Wear loose-fitting, lightweight, and light-colored clothing.  If the weather is extremely cold: ? Avoid vigorous physical activity. ? Layer your clothes. ? Wear mittens or gloves, a hat, and a scarf when you go outside. ? Avoid alcohol. General instructions  Manage other health conditions such as hypertension, diabetes, thyroid disease, or abnormal heart rhythms as told by your health care provider.  Learn to manage stress. If you need help to do this, ask your health care provider.  Plan rest periods when fatigued.  Get ongoing education and support as needed.  Participate in or seek rehabilitation as needed to maintain or improve independence and quality of life.  Stay up to date with immunizations. Keeping current on pneumococcal and influenza immunizations is especially important to prevent respiratory infections.  Keep all follow-up visits as told by your health care provider. This is important. Contact a health care provider if:  You have a rapid weight gain.  You have increasing shortness of breath that is unusual for you.  You are unable to participate in your usual physical activities.  You tire easily.  You cough more than normal, especially with physical activity.  You have any swelling or more swelling in areas such as your hands, feet, ankles, or abdomen.  You are unable to sleep because it is hard to breathe.  You feel like your heart is beating quickly (palpitations).  You become dizzy or light-headed when you stand up. Get help right away if:  You have difficulty breathing.  You notice or your family notices a change in your awareness, such as having trouble staying awake or having  difficulty with concentration.  You have pain or discomfort in your chest.  You have an episode of fainting (syncope). This information is not intended to replace advice given to you by your health care provider. Make sure you discuss any questions you have with your health care provider. Document Released: 05/12/2005 Document Revised: 01/15/2016 Document Reviewed: 12/05/2015 Elsevier Interactive Patient Education  2018 [employer]  

## 2018-04-21 ENCOUNTER — Encounter: Payer: Self-pay | Admitting: Physician Assistant

## 2018-04-21 ENCOUNTER — Other Ambulatory Visit: Payer: Self-pay | Admitting: *Deleted

## 2018-04-21 ENCOUNTER — Telehealth: Payer: Self-pay | Admitting: Physician Assistant

## 2018-04-21 DIAGNOSIS — Z951 Presence of aortocoronary bypass graft: Secondary | ICD-10-CM

## 2018-04-21 DIAGNOSIS — R0600 Dyspnea, unspecified: Secondary | ICD-10-CM | POA: Insufficient documentation

## 2018-04-21 NOTE — Telephone Encounter (Signed)
  Ms Eddie CandleCummings stated that she routed Tereso NewcomerScott Weaver critical results and she feels this patient may need to have fluid drained today. Please call her back on her cell number provided so she can discuss this patient with you.

## 2018-04-21 NOTE — Telephone Encounter (Signed)
I spoke with Mr. David [name] by phone. His breathing was better last night. Since I saw him, he has had more trouble with laying flat. His leg swelling and weight remain improved. I reviewed his CXR.  His L effusion is larger since 11/2. I discussed with the RN for Dr. Cornelius Moraswen.  She will have one of the PAs review his CXR today and determine if he needs his thoracentesis today or he can wait until Mon when he sees Dr. Cornelius Moraswen for post op follow up.  His RN will call the patient back today. I discussed this with Carlis Stableummings, Charley Elizabeth, PA-C as well. Tereso NewcomerScott Everlena Mackley, PA-C    04/21/2018 11:04 AM

## 2018-04-26 ENCOUNTER — Other Ambulatory Visit: Payer: Self-pay

## 2018-04-26 ENCOUNTER — Encounter: Payer: Self-pay | Admitting: Thoracic Surgery (Cardiothoracic Vascular Surgery)

## 2018-04-26 ENCOUNTER — Other Ambulatory Visit: Payer: Self-pay | Admitting: Physician Assistant

## 2018-04-26 ENCOUNTER — Ambulatory Visit
Admission: RE | Admit: 2018-04-26 | Discharge: 2018-04-26 | Disposition: A | Discharge status: Home / Self Care | Payer: Federal, State, Local not specified - PPO | Source: Ambulatory Visit | Attending: Thoracic Surgery (Cardiothoracic Vascular Surgery) | Admitting: Thoracic Surgery (Cardiothoracic Vascular Surgery)

## 2018-04-26 ENCOUNTER — Other Ambulatory Visit: Payer: Federal, State, Local not specified - PPO | Admitting: *Deleted

## 2018-04-26 ENCOUNTER — Ambulatory Visit (INDEPENDENT_AMBULATORY_CARE_PROVIDER_SITE_OTHER): Payer: Self-pay | Admitting: Thoracic Surgery (Cardiothoracic Vascular Surgery)

## 2018-04-26 VITALS — BP 123/69 | HR 78 | Resp 20 | Ht 71.0 in | Wt 197.0 lb

## 2018-04-26 DIAGNOSIS — I2510 Atherosclerotic heart disease of native coronary artery without angina pectoris: Secondary | ICD-10-CM

## 2018-04-26 DIAGNOSIS — Z951 Presence of aortocoronary bypass graft: Secondary | ICD-10-CM

## 2018-04-26 DIAGNOSIS — I5032 Chronic diastolic (congestive) heart failure: Secondary | ICD-10-CM

## 2018-04-26 DIAGNOSIS — R0600 Dyspnea, unspecified: Secondary | ICD-10-CM

## 2018-04-26 MED ORDER — NONFORMULARY OR COMPOUNDED ITEM: 0 refills | Status: AC

## 2018-04-26 NOTE — Progress Notes (Signed)
[address]       [place] [id number]             [phone]     CARDIOTHORACIC SURGERY OFFICE NOTE  Referring Provider is End, Cristal Deerhristopher, MD  Primary Cardiologist is Tonny Bollmanooper, Michael, MD PCP is Carlis Stableummings, Charley Elizabeth, [place]   HPI:  Patient is 77 year old male with no previous history of coronary artery disease who presented with an acute inferior wall ST segment elevation myocardial infarction on March 22, 2018.  He was treated in the Cath Lab with primary balloon angioplasty and placement of intra-aortic balloon pump.  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  His early postoperative recovery was notable for acute combined systolic and diastolic congestive heart failure, postoperative atrial fibrillation treated with amiodarone, and acute kidney injury.  He slowly recovered and ultimately was discharged home in sinus rhythm on the ninth postoperative day.  Since hospital discharge she has been seen in follow-up on 2 occasions by [facility]com[facility], most recently on April 14, 2018.  He was seen in follow-up by his primary care physician on April 20, 2018.  He returns the office today and reports that he is doing quite well.  He has minimal soreness in his chest and he has not taken any sort of pain medications other than Tylenol since hospital discharge.  Appetite is improving but he states that foods taste poorly and have a metallic taste.  He blames this on amiodarone.  He has not had any palpitations or other symptoms to suggest a recurrence of atrial fibrillation.  He has not had problems with shortness of breath.  Overall he is pleased with his progress.   Current Outpatient Medications  Medication Sig Dispense Refill  . acetaminophen (TYLENOL) 325 MG tablet Take 650 mg by mouth every 6 (six) hours as needed for mild pain.    Marland Kitchen. amiodarone (PACERONE)  200 MG tablet Take 1 tablet (200 mg total) by mouth daily. 90 tablet 3  . aspirin EC 81 MG EC tablet Take 1 tablet (81 mg total) by mouth daily.    Marland Kitchen. atorvastatin (LIPITOR) 80 MG tablet Take 1 tablet (80 mg total) by mouth daily at 6 PM. 30 tablet 1  . carvedilol (COREG) 3.125 MG tablet Take 1 tablet (3.125 mg total) by mouth 2 (two) times daily with a meal. 60 tablet 1  . clopidogrel (PLAVIX) 75 MG tablet Take 1 tablet (75 mg total) by mouth daily. 30 tablet 1  . Ferrous Sulfate 140 (45 Fe) MG TBCR 1 TAB PO TID WITH MEALS ON MONDAY, WEDNESDAY, FRIDAY 60 tablet 3  . NONFORMULARY OR COMPOUNDED ITEM Overnight pulse oximetry 1 each 0  . Omega-3 Fatty Acids (FISH OIL) 1000 MG CAPS Take 1,000 mg by mouth daily.    . potassium chloride (K-DUR) 10 MEQ tablet Take 20 mEq on every Mon, Wed, Fri and 10 mEq on Tues, Thurs, Sat, Sun by mouth 120 tablet 3  . Psyllium (METAMUCIL FIBER PO) Take by mouth.    . torsemide (DEMADEX) 20 MG tablet Take 40 mg on Mon, Wed, Fri and 20 mg Tues, Thur, Sat, Sun by mouth 120 tablet 3   No current facility-administered medications for this visit.       Physical Exam:   BP 123/69   Pulse 78   Resp 20   Ht 5\' 11"  (1.803 m)  Wt 197 lb (89.4 kg)   SpO2 95% Comment: RA  BMI 27.48 kg/m   General:  Well-appearing  Chest:   Clear to auscultation with symmetrical breath sounds  CV:   Regular rate and rhythm without murmur  Incisions:  Healing nicely, sternum is stable  Abdomen:  Soft nontender  Extremities:  Warm and well-perfused  Diagnostic Tests:  CHEST - 2 VIEW  COMPARISON:  03/20/2018 chest radiograph  FINDINGS: Stable cardiac silhouette within normal limits given projection and technique. Status post CABG with sternotomy wires stable in alignment. Stable trace right and decreased small left pleural effusions. No pneumothorax. No new consolidation. No acute osseous abnormality is evident.  IMPRESSION: Stable trace right and decreased small left  pleural effusions.   Electronically Signed   By: Mitzi Hansen M.D.   On: 04/26/2018 13:55    Impression:  Patient is doing well following coronary artery bypass grafting  Plan:  I have instructed the patient to stop taking amiodarone when his current prescription runs out.  We have otherwise not recommended any changes to his current medications.  I have encouraged the patient to continue to increase his physical activity with his primary limitation remaining that he refrain from heavy lifting or strenuous use of his arms or shoulders for at least another 2 months.  I think he may resume driving an automobile.  I have encouraged him to enroll and participate in the cardiac rehab program.  The patient will return to our office for routine follow-up next fall, approximately 1 year following his surgery.  He will call and return sooner should specific problems or questions arise.    Salvatore Decent. Cornelius Moras, MD 04/26/2018 2:08 PM

## 2018-04-26 NOTE — Patient Instructions (Signed)
Continue taking Amiodarone one 200 mg tablet daily until your current prescription runs out, then stop taking it altogether.  Continue all other previous medications without any changes at this time  Continue to avoid any heavy lifting or strenuous use of your arms or shoulders for at least a total of three months from the time of surgery.  After three months you may gradually increase how much you lift or otherwise use your arms or chest as tolerated, with limits based upon whether or not activities lead to the return of significant discomfort.  You may return to driving an automobile as long as you are no longer requiring oral narcotic pain relievers during the daytime.  It would be wise to start driving only short distances during the daylight and gradually increase from there as you feel comfortable.  You are encouraged to enroll and participate in the outpatient cardiac rehab program beginning as soon as practical.

## 2018-04-27 LAB — BASIC METABOLIC PANEL
BUN / CREAT RATIO: 20 (ref 10–24)
BUN: 26 mg/dL (ref 8–27)
CO2: 25 mmol/L (ref 20–29)
CREATININE: 1.32 mg/dL — AB (ref 0.76–1.27)
Calcium: 9.8 mg/dL (ref 8.6–10.2)
Chloride: 99 mmol/L (ref 96–106)
GFR calc non Af Amer: 52 mL/min/{1.73_m2} — ABNORMAL LOW (ref >59)
GFR, EST AFRICAN AMERICAN: 61 mL/min/{1.73_m2} (ref >59)
Glucose: 96 mg/dL (ref 65–99)
Potassium: 4.7 mmol/L (ref 3.5–5.2)
SODIUM: 141 mmol/L (ref 134–144)

## 2018-05-03 ENCOUNTER — Telehealth: Payer: Self-pay | Admitting: Physician Assistant

## 2018-05-03 NOTE — Telephone Encounter (Signed)
Received notification from [facility]. Patient declined overnight pulse oximetry. States he does not feel he needs it

## 2018-05-10 ENCOUNTER — Telehealth: Payer: Self-pay | Admitting: Internal Medicine

## 2018-05-10 NOTE — Telephone Encounter (Signed)
Please send referral for pt to participate in Cardiac Rehab program

## 2018-05-11 ENCOUNTER — Encounter: Payer: Self-pay | Admitting: Physician Assistant

## 2018-05-11 ENCOUNTER — Ambulatory Visit (INDEPENDENT_AMBULATORY_CARE_PROVIDER_SITE_OTHER): Payer: Federal, State, Local not specified - PPO | Admitting: Physician Assistant

## 2018-05-11 VITALS — BP 121/74 | HR 75 | Temp 98.1°F | Wt 201.0 lb

## 2018-05-11 DIAGNOSIS — R0609 Other forms of dyspnea: Secondary | ICD-10-CM | POA: Diagnosis not present

## 2018-05-11 DIAGNOSIS — Z2821 Immunization not carried out because of patient refusal: Secondary | ICD-10-CM | POA: Diagnosis not present

## 2018-05-11 DIAGNOSIS — J90 Pleural effusion, not elsewhere classified: Secondary | ICD-10-CM

## 2018-05-11 DIAGNOSIS — Z1211 Encounter for screening for malignant neoplasm of colon: Secondary | ICD-10-CM | POA: Diagnosis not present

## 2018-05-11 DIAGNOSIS — N182 Chronic kidney disease, stage 2 (mild): Secondary | ICD-10-CM | POA: Insufficient documentation

## 2018-05-11 NOTE — Progress Notes (Signed)
HPI:                                                                David [name] is a 77 y.o. male who presents to [facility]: [place] today for follow-up  Since last OV He was evaluated by CT surgeon on 04/26/18 for DOE and left pleural effusion. Repeat CXR showed stability and he did not have any intervention Per CT surgeon, he can d/c Amiodarone after current Rx runs out Reports his breathing is significantly improved. He still has some DOE, such as with working on his truck. Still living with his daughter. Upcoming appt with Richardson Dopp on 12/20 Has not yet started cardiac rehab. Reports    Depression screen [facility] 2/9 04/13/2018  Decreased Interest 0  Down, Depressed, Hopeless 0  PHQ - 2 Score 0    No flowsheet data found.    Past Medical History:  Diagnosis Date  . CAD (coronary artery disease) 03/22/2018   S/p Inf STEMI 10/19 >> Tx with POBA to RCA then emergent CABG (LIMA to LAD, Free RIMA to OM, and SVG to PDA) with Dr. Roxy Manns  . Chronic diastolic CHF (congestive heart failure) ([place]) 04/08/2018   Post CABG volume overload req'd Lasix gtt // complicated by AKI // Echo 03/31/18:  Mild LVH, inf AK, EF 50, Gr 2 DD, Asc aorta 39 mm (mildly dilated), mild MAC, trivial MR, mild LAE, mild dilated RV, normal RVSF, mild RAE, trivial effusion // Echo 03/23/18: Mod conc LVH, EF 50-55, inf AK, normal diastolic function, Asc aorta 40 mm, mild RV dilation   . Hyperlipidemia 04/08/2018  . Postoperative atrial fibrillation (HCC)    Tx with Amiodarone   Past Surgical History:  Procedure Laterality Date  . CORONARY ARTERY BYPASS GRAFT N/A 03/22/2018   Procedure: CORONARY ARTERY BYPASS GRAFTING (CABG) x 3. ENDOSCOPIC HARVESTING OF RIGHT GREATER SPAPHENOUS VEIN AND OPEN HARVESTING OF LEFT GREATER SAPHENOUS VEIN. BILATERAL IMA;  Surgeon: Rexene Alberts, MD;  Location: [facility];  Service: Open Heart Surgery;  Laterality: N/A;  . CORONARY/GRAFT ACUTE MI  REVASCULARIZATION N/A 03/22/2018   Procedure: Coronary/Graft Acute MI Revascularization;  Surgeon: Nelva Bush, MD;  Location: [facility];  Service: Cardiovascular;  Laterality: N/A;  . HERNIA REPAIR    . IABP INSERTION N/A 03/22/2018   Procedure: IABP Insertion;  Surgeon: Nelva Bush, MD;  Location: [facility];  Service: Cardiovascular;  Laterality: N/A;  . LEFT HEART CATH AND CORONARY ANGIOGRAPHY N/A 03/22/2018   Procedure: LEFT HEART CATH AND CORONARY ANGIOGRAPHY;  Surgeon: Nelva Bush, MD;  Location: [facility];  Service: Cardiovascular;  Laterality: N/A;  . TEE WITHOUT CARDIOVERSION N/A 03/22/2018   Procedure: TRANSESOPHAGEAL ECHOCARDIOGRAM (TEE);  Surgeon: Rexene Alberts, MD;  Location: [facility];  Service: Open Heart Surgery;  Laterality: N/A;   Social History   Tobacco Use  . Smoking status: Former Smoker    Types: Cigarettes    Last attempt to quit: 07/03/1977    Years since quitting: 40.8  . Smokeless tobacco: Never Used  Substance Use Topics  . Alcohol use: Never    Frequency: Never   family history includes Rheum arthritis in his brother and mother.    ROS: negative except  as noted in the HPI  Medications: Current Outpatient Medications  Medication Sig Dispense Refill  . acetaminophen (TYLENOL) 325 MG tablet Take 650 mg by mouth every 6 (six) hours as needed for mild pain.    Marland Kitchen amiodarone (PACERONE) 200 MG tablet Take 1 tablet (200 mg total) by mouth daily. 90 tablet 3  . aspirin EC 81 MG EC tablet Take 1 tablet (81 mg total) by mouth daily.    Marland Kitchen atorvastatin (LIPITOR) 80 MG tablet Take 1 tablet (80 mg total) by mouth daily at 6 PM. 30 tablet 1  . carvedilol (COREG) 3.125 MG tablet Take 1 tablet (3.125 mg total) by mouth 2 (two) times daily with a meal. 60 tablet 1  . clopidogrel (PLAVIX) 75 MG tablet Take 1 tablet (75 mg total) by mouth daily. 30 tablet 1  . Ferrous Sulfate 140 (45 Fe) MG TBCR 1 TAB PO TID WITH MEALS ON MONDAY,  WEDNESDAY, FRIDAY 60 tablet 3  . NONFORMULARY OR COMPOUNDED ITEM Overnight pulse oximetry 1 each 0  . Omega-3 Fatty Acids (FISH OIL) 1000 MG CAPS Take 1,000 mg by mouth daily.    . potassium chloride (K-DUR) 10 MEQ tablet Take 20 mEq on every Mon, Wed, Fri and 10 mEq on Tues, Thurs, Sat, Sun by mouth 120 tablet 3  . Psyllium (METAMUCIL FIBER PO) Take by mouth.    . torsemide (DEMADEX) 20 MG tablet Take 40 mg on Mon, Wed, Fri and 20 mg Tues, Thur, Sat, Sun by mouth 120 tablet 3   No current facility-administered medications for this visit.    No Known Allergies     Objective:  BP 121/74   Pulse 75   Temp 98.1 F (36.7 C) (Oral)   Wt 201 lb (91.2 kg)   SpO2 94%   BMI 28.03 kg/m  Gen:  alert, well-appearing, no distress, appropriate for age 20: head normocephalic without obvious abnormality, conjunctiva and cornea clear, trachea midline Pulm: Normal work of breathing, normal phonation, clear to auscultation bilaterally, no wheezes, rales or rhonchi CV: Normal rate, regular rhythm, s1 and s2 distinct, no murmurs, clicks or rubs  Neuro: alert and oriented x 3, no tremor MSK: extremities atraumatic, normal gait and station, trace peripheral edema left, 1+ peripheral edema right Skin: intact, no rashes on exposed skin, no jaundice, no cyanosis Psych: well-groomed, cooperative, good eye contact, euthymic mood, affect mood-congruent, speech is articulate, and thought processes clear and goal-directed  Wt Readings from Last 3 Encounters:  05/11/18 201 lb (91.2 kg)  04/26/18 197 lb (89.4 kg)  04/20/18 202 lb (91.6 kg)   Lab Results  Component Value Date   CREATININE 1.32 (H) 04/26/2018   BUN 26 04/26/2018   NA 141 04/26/2018   K 4.7 04/26/2018   CL 99 04/26/2018   CO2 25 04/26/2018    Study Result   CLINICAL DATA:  77 y/o  M; CABG 03/22/2018.  Chest soreness.  EXAM: CHEST - 2 VIEW  COMPARISON:  03/20/2018 chest radiograph  FINDINGS: Stable cardiac silhouette within  normal limits given projection and technique. Status post CABG with sternotomy wires stable in alignment. Stable trace right and decreased small left pleural effusions. No pneumothorax. No new consolidation. No acute osseous abnormality is evident.  IMPRESSION: Stable trace right and decreased small left pleural effusions.   Electronically Signed   By: Kristine Garbe M.D.   On: 04/26/2018 13:55      No results found for this or any previous visit (from the past 72  hour(s)). No results found.    Assessment and Plan: 77 y.o. male with   .Bertie was seen today for follow-up.  Diagnoses and all orders for this visit:  Pleural effusion  Colon cancer screening -     Cologuard  Refused pneumococcal vaccination  Refused influenza vaccine  DOE (dyspnea on exertion)   DOE/Pleural effusion [place] is doing much better. Repeat CXR showed stability of his left pleural effusion. Pulse ox 94% on RA at rest. Trace and 1+ peripheral edema on exam Dry weights at home 190.6-194 Keep f/u with Cardiology   CKD stage 2 stable Avoid nephrotoxins Repeat renal function 3 months  Personally reviewed health maintenance Declines age-recommended vaccinations Has never had colon cancer screening. Cologuard ordered today  Patient education and anticipatory guidance given Patient agrees with treatment plan Follow-up in 3 months as needed if symptoms worsen or fail to improve  Darlyne Russian PA-C

## 2018-05-12 NOTE — Telephone Encounter (Signed)
Ok. Please make referral. thx

## 2018-05-13 NOTE — Telephone Encounter (Signed)
[facility] referral form received, filled out, and faxed to [phone]

## 2018-05-13 NOTE — Progress Notes (Signed)
Cardiology Office Note:    Date:  05/14/2018   ID:  David [name], DOB 11/26/1940, MRN [id number]  PCP:  David Dredge, PA-C  Cardiologist:  David Mocha, MD   Electrophysiologist:  None   Referring MD: David [name]*   Chief Complaint  Patient presents with  . Follow-up    CAD, CHF     History of Present Illness:    David [name] is a 77 y.o. male with coronary artery disease s/p inferior ST elevation myocardial infarction in 02/2018 treated with POBA of an occluded RCA followed by emergent coronary artery bypass grafting (LIMA to LAD, Free RIMA to OM, and SVG to PDA) with David [name].  His post op course was complicated by atrial fibrillation, significant volume excess, AKI and anemia. He converted to normal sinus rhythmon Amiodarone. He developed AKI on Lasix drip. He was transfused with PRBCs. Repeat echocardiogramprior to DC demonstrated an EF of 50%, inferior akinesis, grade 2diastolic dysfunction. He has been followed closely since discharge from the hospital due to volume excess.  At last visit 04/14/2018, his volume status had improved significantly.  However, at follow-up with primary care, he complained of increased shortness of breath.  Chest x-ray did demonstrate an enlarging small left pleural effusion.  At follow-up with David [name] on 04/26/2018, his pleural effusion had decreased in size.   David [name] returns for follow up.  He is feeling better.  He denies significant shortness of breath.  He has not had any significant chest pain.  He denies orthopnea.  His lower extremity swelling is improved.  He denies syncope.  Prior CV studies:   The following studies were reviewed today:  Echo 03/31/18 Mild LVH, inf AK, EF 50, Gr 2 DD, Asc aorta 39 mm (mildly dilated), mild MAC, trivial MR, mild LAE, mild dilated RV, normal RVSF, mild RAE, trivial effusion  Echo 03/23/18 Mod conc LVH, EF 50-55, inf AK, normal diastolic function, Asc aorta 40  mm, mild RV dilation  Cardiac Catheterization10/28/19 Conclusions: 1. Severe three-vessel CAD, as detailed below. Culprit lesion is acute plaque rupture and thrombotic subtotal occlusion of the mid RCA with TIMI-1 flow.. 2. Moderately elevated left ventricular filling pressure with LVEF of 35 to 45%. There is inferior akinesis. 3. Successful PTCA of the mid RCA using a 2.5 x 15 mm balloon with reduction of stenosis from 99% to 30% and restoration of TIMI-3 flow. 4. Successful placement of 50 mL intra-aortic balloon pump via the right common femoral artery. Recommendations: 1. Patient to go to the operating room for emergent CABG with David [name]. We will continue Cangrelor until he proceeds to the operating room. 2. Aggressive secondary prevention, including high intensity statin therapy. Recommend uninterrupted dual antiplatelet therapy with Aspirin 13m daily and Clopidogrel 772mdailyfor a minimum of 12 months (ACS - Class I recommendation).Initiation of clopidogrel will be deferred until after CABG.  Past Medical History:  Diagnosis Date  . CAD (coronary artery disease) 03/22/2018   S/p Inf STEMI 10/19 >> Tx with POBA to RCA then emergent CABG (LIMA to LAD, Free RIMA to OM, and SVG to PDA) with Dr. OwRoxy [name]. Chronic diastolic CHF (congestive heart failure) [place]/14[place]   Post CABG volume overload req'd Lasix gtt // complicated by AKI // Echo 03/31/18:  Mild LVH, inf AK, EF 50, Gr 2 DD, Asc aorta 39 mm (mildly dilated), mild MAC, trivial MR, mild LAE, mild dilated RV, normal RVSF, mild RAE, trivial effusion // Echo 03/23/18: Mod conc LVH,  EF 50-55, inf AK, normal diastolic function, Asc aorta 40 mm, mild RV dilation   . Hyperlipidemia 04/08/2018  . Postoperative atrial fibrillation (HCC)    Tx with Amiodarone   Surgical Hx: The patient  has a past surgical history that includes Hernia repair; LEFT HEART CATH AND CORONARY ANGIOGRAPHY (N/A, 03/22/2018); IABP Insertion (N/A,  03/22/2018); Coronary/Graft Acute MI Revascularization (N/A, 03/22/2018); Coronary artery bypass graft (N/A, 03/22/2018); and TEE without cardioversion (N/A, 03/22/2018).   Current Medications: Current Meds  Medication Sig  . acetaminophen (TYLENOL) 325 MG tablet Take 650 mg by mouth every 6 (six) hours as needed for mild pain.  Marland Kitchen aspirin EC 81 MG EC tablet Take 1 tablet (81 mg total) by mouth daily.  Marland Kitchen atorvastatin (LIPITOR) 80 MG tablet Take 1 tablet (80 mg total) by mouth daily at 6 PM.  . carvedilol (COREG) 3.125 MG tablet Take 1 tablet (3.125 mg total) by mouth 2 (two) times daily with a meal.  . clopidogrel (PLAVIX) 75 MG tablet Take 1 tablet (75 mg total) by mouth daily.  . Ferrous Sulfate 140 (45 Fe) MG TBCR 1 TAB PO TID WITH MEALS ON MONDAY, WEDNESDAY, FRIDAY  . NONFORMULARY OR COMPOUNDED ITEM Overnight pulse oximetry  . Omega-3 Fatty Acids (FISH OIL) 1000 MG CAPS Take 1,000 mg by mouth daily.  . Psyllium (METAMUCIL FIBER PO) Take by mouth.  . [DISCONTINUED] amiodarone (PACERONE) 200 MG tablet Take 1 tablet (200 mg total) by mouth daily.  . [DISCONTINUED] potassium chloride (K-DUR) 10 MEQ tablet Take 20 mEq on every Mon, Wed, Fri and 10 mEq on Tues, Thurs, Sat, Sun by mouth  . [DISCONTINUED] torsemide (DEMADEX) 20 MG tablet Take 40 mg on Mon, Wed, Fri and 20 mg Tues, Thur, Sat, Sun by mouth     Allergies:   Patient has no known allergies.   Social History   Tobacco Use  . Smoking status: Former Smoker    Types: Cigarettes    Last attempt to quit: 07/03/1977    Years since quitting: 40.8  . Smokeless tobacco: Never Used  Substance Use Topics  . Alcohol use: Never    Frequency: Never  . Drug use: Never     Family Hx: The patient's family history includes Rheum arthritis in his brother and mother.  ROS:   Please see the history of present illness.    ROS All other systems reviewed and are negative.   EKGs/Labs/Other Test Reviewed:    EKG:  EKG is  ordered today.  The  ekg ordered today demonstrates normal sinus rhythm, heart rate 70, normal axis, nonspecific ST-T wave changes, QTC 451, similar to prior tracings  Recent Labs: 03/22/2018: ALT 43 03/26/2018: Magnesium 2.3 04/05/2018: Platelets 398 04/20/2018: Hemoglobin 9.2 04/26/2018: BUN 26; Creatinine, Ser 1.32; Potassium 4.7; Sodium 141   Recent Lipid Panel Lab Results  Component Value Date/Time   CHOL 123 03/25/2018 02:49 AM   TRIG 175 (H) 03/25/2018 02:49 AM   HDL 22 (L) 03/25/2018 02:49 AM   CHOLHDL 5.6 03/25/2018 02:49 AM   LDLCALC 66 03/25/2018 02:49 AM    Physical Exam:    VS:  BP 112/62   Pulse 70   Ht _0  (1.803 m)   Wt 201 lb (91.2 kg)   SpO2 97%   BMI 28.03 kg/m     Wt Readings from Last 3 Encounters:  05/14/18 201 lb (91.2 kg)  05/11/18 201 lb (91.2 kg)  04/26/18 197 lb (89.4 kg)     Physical Exam  Constitutional: He is oriented to person, place, and time. He appears well-developed and well-nourished. No distress.  HENT:  Head: Normocephalic and atraumatic.  Eyes: No scleral icterus.  Neck: Neck supple. No JVD present. No thyromegaly present.  Cardiovascular: Normal rate, regular rhythm, S1 normal and S2 normal.  No murmur heard. Pulmonary/Chest: Breath sounds normal. He has no rales.  Abdominal: Soft. There is no hepatomegaly.  Musculoskeletal:        General: Edema (very trace bilat LE edema) present.  Lymphadenopathy:    He has no cervical adenopathy.  Neurological: He is alert and oriented to person, place, and time.  Skin: Skin is warm and dry.  Psychiatric: He has a normal mood and affect.    ASSESSMENT & PLAN:    Chronic diastolic CHF (congestive heart failure) ([place]) EF 50 by echo in November 2019 with moderate diastolic dysfunction.  He is currently stable on his current dose of diuretic.  He has demonstrated significant improvement since his discharge from the hospital.  I think he can decrease his torsemide 20 mg daily and potassium to 10 mill  equivalents daily.  I have asked him to contact us if his weight increases or he has increased swelling or shortness of breath with this.  Coronary artery disease involving native coronary artery of native heart without angina pectoris Status post inferior MI treated with balloon angioplasty to the RCA and subsequent emergent CABG in October 2019.  He is progressing well.  Continue aspirin, Plavix, atorvastatin, carvedilol.  He should be able to tolerate the addition of low-dose ACE inhibitor.  Start lisinopril 2.5 mg daily.  Follow-up BMET in 2 weeks.  Postoperative atrial fibrillation (HCC) Maintaining normal sinus rhythm.  David [name] has recommended that he stop amiodarone after he finishes his current prescription.  Hyperlipidemia, unspecified hyperlipidemia type Continue high-dose atorvastatin.  Lipids and LFTs will be obtained today.   Dispo:  Return in about 3 months (around 08/13/2018) for Routine Follow Up, w/ Dr. Burt Knack.   Medication Adjustments/Labs and Tests Ordered: Current medicines are reviewed at length with the patient today.  Concerns regarding medicines are outlined above.  Tests Ordered: Orders Placed This Encounter  Procedures  . Basic metabolic panel  . EKG 12-Lead   Medication Changes: Meds ordered this encounter  Medications  . torsemide (DEMADEX) 20 MG tablet    Sig: Take 1 tablet (20 mg total) by mouth daily.    Dispense:  180 tablet    Refill:  3  . potassium chloride (K-DUR) 10 MEQ tablet    Sig: Take 1 tablet (10 mEq total) by mouth daily.    Dispense:  90 tablet    Refill:  3  . lisinopril (PRINIVIL,ZESTRIL) 2.5 MG tablet    Sig: Take 1 tablet (2.5 mg total) by mouth daily.    Dispense:  90 tablet    Refill:  3    Signed, Richardson Dopp, PA-C  05/14/2018 1:59 PM    [facility] [address], [place] , [place]  [address] Phone: [phone]; Fax: [phone]

## 2018-05-13 NOTE — Telephone Encounter (Signed)
Spoke with [facility]. The nurse will fax a referral form to be filled out and sent.

## 2018-05-14 ENCOUNTER — Encounter: Payer: Self-pay | Admitting: Physician Assistant

## 2018-05-14 ENCOUNTER — Ambulatory Visit (INDEPENDENT_AMBULATORY_CARE_PROVIDER_SITE_OTHER): Payer: Federal, State, Local not specified - PPO | Admitting: Physician Assistant

## 2018-05-14 ENCOUNTER — Other Ambulatory Visit: Payer: Federal, State, Local not specified - PPO

## 2018-05-14 VITALS — BP 112/62 | HR 70 | Ht 71.0 in | Wt 201.0 lb

## 2018-05-14 DIAGNOSIS — I4891 Unspecified atrial fibrillation: Secondary | ICD-10-CM

## 2018-05-14 DIAGNOSIS — I25810 Atherosclerosis of coronary artery bypass graft(s) without angina pectoris: Secondary | ICD-10-CM

## 2018-05-14 DIAGNOSIS — I2510 Atherosclerotic heart disease of native coronary artery without angina pectoris: Secondary | ICD-10-CM | POA: Diagnosis not present

## 2018-05-14 DIAGNOSIS — E785 Hyperlipidemia, unspecified: Secondary | ICD-10-CM

## 2018-05-14 DIAGNOSIS — I9789 Other postprocedural complications and disorders of the circulatory system, not elsewhere classified: Secondary | ICD-10-CM | POA: Diagnosis not present

## 2018-05-14 DIAGNOSIS — I5032 Chronic diastolic (congestive) heart failure: Secondary | ICD-10-CM

## 2018-05-14 LAB — HEPATIC FUNCTION PANEL
ALT: 27 IU/L (ref 0–44)
AST: 22 IU/L (ref 0–40)
Albumin: 4.4 g/dL (ref 3.5–4.8)
Alkaline Phosphatase: 92 IU/L (ref 39–117)
Bilirubin Total: 0.5 mg/dL (ref 0.0–1.2)
Bilirubin, Direct: 0.16 mg/dL (ref 0.00–0.40)
Total Protein: 7.3 g/dL (ref 6.0–8.5)

## 2018-05-14 LAB — LIPID PANEL
Chol/HDL Ratio: 3 ratio (ref 0.0–5.0)
Cholesterol, Total: 146 mg/dL (ref 100–199)
HDL: 48 mg/dL (ref >39)
LDL Calculated: 67 mg/dL (ref 0–99)
TRIGLYCERIDES: 153 mg/dL — AB (ref 0–149)
VLDL Cholesterol Cal: 31 mg/dL (ref 5–40)

## 2018-05-14 MED ORDER — LISINOPRIL 2.5 MG PO TABS: 2.5000 mg | ORAL_TABLET | Freq: Every day | ORAL | 3 refills | Status: DC

## 2018-05-14 MED ORDER — TORSEMIDE 20 MG PO TABS: 20.0000 mg | ORAL_TABLET | Freq: Every day | ORAL | 3 refills | Status: DC

## 2018-05-14 MED ORDER — POTASSIUM CHLORIDE ER 10 MEQ PO TBCR: 10.0000 meq | EXTENDED_RELEASE_TABLET | Freq: Every day | ORAL | 3 refills | Status: DC

## 2018-05-14 NOTE — Patient Instructions (Addendum)
Medication Instructions:  Your physician has recommended you make the following change in your medication:  1. DECREASE TORSEMIDE 20 MG DAILY.  2. DECREASE POTASSIUM TO 10 MEQ DAILY.  3. START LISINOPRIL 2.5 MG DAILY.  If you need a refill on your cardiac medications before your next appointment, please call your pharmacy.   Lab work: LAB TO BE DONE AT PRIMARY CARE DOCTOR IN 2 WEEKS: BMET   If you have labs (blood work) drawn today and your tests are completely normal, you will receive your results only by: Marland Kitchen. MyChart Message (if you have MyChart) OR . A paper copy in the mail If you have any lab test that is abnormal or we need to change your treatment, we will call you to review the results.  Testing/Procedures: NONE  Follow-Up: At [facility], you and your health needs are our priority.  As part of our continuing mission to provide you with exceptional heart care, we have created designated Provider Care Teams.  These Care Teams include your primary Cardiologist (physician) and Advanced Practice Providers (APPs -  Physician Assistants and Nurse Practitioners) who all work together to provide you with the care you need, when you need it. You will need a follow up appointment in:  3 months.  Please call our office 2 months in advance to schedule this appointment.  You may see Tonny BollmanMichael Cooper, MD or one of the following Advanced Practice Providers on your designated Care Team: Tereso NewcomerScott Weaver, PA-C Vin High ForestBhagat, [place] JerseyPA-C . Berton BonJanine Hammond, NP  Any Other Special Instructions Will Be Listed Below (If Applicable).

## 2018-05-17 ENCOUNTER — Telehealth (HOSPITAL_COMMUNITY): Payer: Self-pay | Admitting: *Deleted

## 2018-05-17 NOTE — Telephone Encounter (Signed)
Received referral for pt to participate in cardiac rehab here at [facility].  Noted in his chart pt desired to participate at [facility] since [place] in [place].  Called to verify with pt his preference.  Pt indicated that he has been contacted and scheduled for Cardiac Rehab on 1/9.  Pt thanked me for my call. Alanson Alyarlette French Kendra RN, BSN Cardiac and Emergency planning/management officerulmonary Rehab Nurse Navigator

## 2018-06-02 ENCOUNTER — Other Ambulatory Visit: Payer: Self-pay | Admitting: Cardiovascular Disease

## 2018-06-02 MED ORDER — CARVEDILOL 3.125 MG PO TABS: 3.1250 mg | ORAL_TABLET | Freq: Two times a day (BID) | ORAL | 3 refills | Status: DC

## 2018-06-02 MED ORDER — ATORVASTATIN CALCIUM 80 MG PO TABS: 80.0000 mg | ORAL_TABLET | Freq: Every day | ORAL | 3 refills | Status: DC

## 2018-06-02 MED ORDER — CLOPIDOGREL BISULFATE 75 MG PO TABS: 75.0000 mg | ORAL_TABLET | Freq: Every day | ORAL | 3 refills | Status: DC

## 2018-06-02 MED ORDER — TORSEMIDE 20 MG PO TABS: 20.0000 mg | ORAL_TABLET | Freq: Every day | ORAL | 3 refills | Status: DC

## 2018-06-02 MED ORDER — POTASSIUM CHLORIDE ER 10 MEQ PO TBCR: 10.0000 meq | EXTENDED_RELEASE_TABLET | Freq: Every day | ORAL | 3 refills | Status: DC

## 2018-06-02 MED ORDER — LISINOPRIL 2.5 MG PO TABS: 2.5000 mg | ORAL_TABLET | Freq: Every day | ORAL | 3 refills | Status: DC

## 2018-06-02 NOTE — Telephone Encounter (Signed)
Pt's medications were sent to pt's pharmacy as requested. Confirmation received.  

## 2018-08-09 ENCOUNTER — Ambulatory Visit: Payer: Federal, State, Local not specified - PPO | Admitting: Physician Assistant

## 2018-08-09 ENCOUNTER — Encounter: Payer: Self-pay | Admitting: Physician Assistant

## 2018-08-09 ENCOUNTER — Other Ambulatory Visit: Payer: Self-pay

## 2018-08-09 VITALS — BP 98/60 | HR 65 | Ht 71.0 in | Wt 201.8 lb

## 2018-08-09 DIAGNOSIS — I9789 Other postprocedural complications and disorders of the circulatory system, not elsewhere classified: Secondary | ICD-10-CM | POA: Diagnosis not present

## 2018-08-09 DIAGNOSIS — I5032 Chronic diastolic (congestive) heart failure: Secondary | ICD-10-CM

## 2018-08-09 DIAGNOSIS — I2510 Atherosclerotic heart disease of native coronary artery without angina pectoris: Secondary | ICD-10-CM

## 2018-08-09 DIAGNOSIS — E785 Hyperlipidemia, unspecified: Secondary | ICD-10-CM | POA: Diagnosis not present

## 2018-08-09 DIAGNOSIS — I4891 Unspecified atrial fibrillation: Secondary | ICD-10-CM

## 2018-08-09 LAB — BASIC METABOLIC PANEL
BUN/Creatinine Ratio: 23 (ref 10–24)
BUN: 32 mg/dL — ABNORMAL HIGH (ref 8–27)
CO2: 21 mmol/L (ref 20–29)
Calcium: 9.9 mg/dL (ref 8.6–10.2)
Chloride: 102 mmol/L (ref 96–106)
Creatinine, Ser: 1.39 mg/dL — ABNORMAL HIGH (ref 0.76–1.27)
GFR calc Af Amer: 57 mL/min/{1.73_m2} — ABNORMAL LOW (ref >59)
GFR calc non Af Amer: 49 mL/min/{1.73_m2} — ABNORMAL LOW (ref >59)
Glucose: 97 mg/dL (ref 65–99)
POTASSIUM: 5 mmol/L (ref 3.5–5.2)
Sodium: 140 mmol/L (ref 134–144)

## 2018-08-09 MED ORDER — TORSEMIDE 20 MG PO TABS: ORAL_TABLET | ORAL | 11 refills | Status: DC

## 2018-08-09 MED ORDER — POTASSIUM CHLORIDE ER 10 MEQ PO TBCR: EXTENDED_RELEASE_TABLET | ORAL | 11 refills | Status: DC

## 2018-08-09 NOTE — Patient Instructions (Signed)
Medication Instructions:  Decrease Demadex (Torsemide) to 20 mg once daily on Mondays, Wednesdays and Fridays only. Decrease KDur (potassium) to 10 mEq once daily on Mondays, Wednesdays and Fridays only.  If your weight goes up 3 lbs or more in 1 day, you have increased swelling in your legs or you become short of breath, resume taking Demadex and the potassium every day, and call us.  If you need a refill on your cardiac medications before your next appointment, please call your pharmacy.   Lab work: Today - BMET  If you have labs (blood work) drawn today and your tests are completely normal, you will receive your results only by: Marland Kitchen MyChart Message (if you have MyChart) OR . A paper copy in the mail If you have any lab test that is abnormal or we need to change your treatment, we will call you to review the results.  Testing/Procedures: None   Follow-Up: At [facility], you and your health needs are our priority.  As part of our continuing mission to provide you with exceptional heart care, we have created designated Provider Care Teams.  These Care Teams include your primary Cardiologist (physician) and Advanced Practice Providers (APPs -  Physician Assistants and Nurse Practitioners) who all work together to provide you with the care you need, when you need it. You will need a follow up appointment in:  6 months.  Please call our office 2 months in advance to schedule this appointment.  You may see Tonny Bollman, MD or Tereso Newcomer, PA-C   Any Other Special Instructions Will Be Listed Below (If Applicable).

## 2018-08-09 NOTE — Progress Notes (Signed)
Cardiology Office Note:    Date:  08/09/2018   ID:  David [name], DOB Sep 15, 1940, MRN [id number]  PCP:  Trixie Dredge, PA-C  Cardiologist:  Sherren Mocha, MD   Electrophysiologist:  None   Referring MD: Ottis Stain*   Chief Complaint  Patient presents with  . Follow-up    CAD, CHF     History of Present Illness:    David [name] is a 78 y.o. male with coronary artery diseases/p inferior ST elevation MI in 02/2018 treated with POBA of an occluded RCA followed by emergentcoronary artery bypass grafting(LIMA to LAD, Free RIMA to OM, and SVG to PDA) with Dr. Roxy Manns. His post op course was c/b AFib requiring Amiodarone, volume overload, anemia requiring transfusion with PRBCs and AKI.  He continued to have issues with volume overload post DC requiring adjustments in his diuretics.  He was last seen in 04/2018.    David [name] returns for follow up.  He is here alone.  He stopped going to cardiac rehabilitation b/c it was too expensive.  He is walking on the treadmill at home.  He has some residual incisional pain but no angina.  He has not had significant shortness of breath.  He has not had paroxysmal nocturnal dyspnea, leg swelling, syncope.  He denies dizziness but has some loss of balance at times. He was told his BP was low on occasion at cardiac rehabilitation.   Prior CV studies:   The following studies were reviewed today:  Echo 03/31/18 Mild LVH, inf AK, EF 50, Gr 2 DD, Asc aorta 39 mm (mildly dilated), mild MAC, trivial MR, mild LAE, mild dilated RV, normal RVSF, mild RAE, trivial effusion  Echo 03/23/18 Mod conc LVH, EF 50-55, inf AK, normal diastolic function, Asc aorta 40 mm, mild RV dilation  Cardiac Catheterization10/28/19 Conclusions: 1. Severe three-vessel CAD, as detailed below. Culprit lesion is acute plaque rupture and thrombotic subtotal occlusion of the mid RCA with TIMI-1 flow.. 2. Moderately elevated left ventricular  filling pressure with LVEF of 35 to 45%. There is inferior akinesis. 3. Successful PTCA of the mid RCA using a 2.5 x 15 mm balloon with reduction of stenosis from 99% to 30% and restoration of TIMI-3 flow. 4. Successful placement of 50 mL intra-aortic balloon pump via the right common femoral artery. Recommendations: 1. Patient to go to the operating room for emergent CABG with Dr. Roxy Manns. We will continue Cangrelor until he proceeds to the operating room. 2. Aggressive secondary prevention, including high intensity statin therapy. Recommend uninterrupted dual antiplatelet therapy with Aspirin 37m daily and Clopidogrel 738mdailyfor a minimum of 12 months (ACS - Class I recommendation).Initiation of clopidogrel will be deferred until after CABG.   Past Medical History:  Diagnosis Date  . CAD (coronary artery disease) 03/22/2018   S/p Inf STEMI 10/19 >> Tx with POBA to RCA then emergent CABG (LIMA to LAD, Free RIMA to OM, and SVG to PDA) with Dr. OwRoxy Manns. Chronic diastolic CHF (congestive [facility] failure) [facility]   Post CABG volume overload req'd Lasix gtt // complicated by AKI // Echo 03/31/18:  Mild LVH, inf AK, EF 50, Gr 2 DD, Asc aorta 39 mm (mildly dilated), mild MAC, trivial MR, mild LAE, mild dilated RV, normal RVSF, mild RAE, trivial effusion // Echo 03/23/18: Mod conc LVH, EF 50-55, inf AK, normal diastolic function, Asc aorta 40 mm, mild RV dilation   . Hyperlipidemia 04/08/2018  . Postoperative atrial fibrillation (HCC)    Tx  with Amiodarone   Surgical Hx: The patient  has a past surgical history that includes Hernia repair; LEFT HEART CATH AND CORONARY ANGIOGRAPHY (N/A, 03/22/2018); IABP Insertion (N/A, 03/22/2018); Coronary/Graft Acute MI Revascularization (N/A, 03/22/2018); Coronary artery bypass graft (N/A, 03/22/2018); and TEE without cardioversion (N/A, 03/22/2018).   Current Medications: Current Meds  Medication Sig  . acetaminophen (TYLENOL) 325 MG tablet Take 650  mg by mouth every 6 (six) hours as needed for mild pain.  Marland Kitchen aspirin EC 81 MG EC tablet Take 1 tablet (81 mg total) by mouth daily.  Marland Kitchen atorvastatin (LIPITOR) 80 MG tablet Take 1 tablet (80 mg total) by mouth daily at 6 PM.  . carvedilol (COREG) 3.125 MG tablet Take 1 tablet (3.125 mg total) by mouth 2 (two) times daily with a meal.  . clopidogrel (PLAVIX) 75 MG tablet Take 1 tablet (75 mg total) by mouth daily.  . Ferrous Sulfate 140 (45 Fe) MG TBCR 1 TAB PO TID WITH MEALS ON MONDAY, WEDNESDAY, FRIDAY  . lisinopril (PRINIVIL,ZESTRIL) 2.5 MG tablet Take 1 tablet (2.5 mg total) by mouth daily.  . NONFORMULARY OR COMPOUNDED ITEM Overnight pulse oximetry  . Omega-3 Fatty Acids (FISH OIL) 1000 MG CAPS Take 1,000 mg by mouth daily.  . Psyllium (METAMUCIL FIBER PO) Take by mouth.  . [DISCONTINUED] potassium chloride (K-DUR) 10 MEQ tablet Take 1 tablet (10 mEq total) by mouth daily.  . [DISCONTINUED] torsemide (DEMADEX) 20 MG tablet Take 1 tablet (20 mg total) by mouth daily.     Allergies:   Patient has no known allergies.   Social History   Tobacco Use  . Smoking status: Former Smoker    Types: Cigarettes    Last attempt to quit: 07/03/1977    Years since quitting: 41.1  . Smokeless tobacco: Never Used  Substance Use Topics  . Alcohol use: Never    Frequency: Never  . Drug use: Never     Family Hx: The patient's family history includes Rheum arthritis in his brother and mother.  ROS:   Please see the history of present illness.    Review of Systems  Gastrointestinal: Positive for constipation.  Neurological: Positive for headaches.   All other systems reviewed and are negative.   EKGs/Labs/Other Test Reviewed:    EKG:  EKG is not ordered today.    Recent Labs: 03/26/2018: Magnesium 2.3 04/05/2018: Platelets 398 04/20/2018: Hemoglobin 9.2 04/26/2018: BUN 26; Creatinine, Ser 1.32; Potassium 4.7; Sodium 141 05/14/2018: ALT 27   Recent Lipid Panel Lab Results  Component Value  Date/Time   CHOL 146 05/14/2018 10:09 AM   TRIG 153 (H) 05/14/2018 10:09 AM   HDL 48 05/14/2018 10:09 AM   CHOLHDL 3.0 05/14/2018 10:09 AM   CHOLHDL 5.6 03/25/2018 02:49 AM   LDLCALC 67 05/14/2018 10:09 AM   From [employer]    Physical Exam:    VS:  BP 98/60   Pulse 65   Ht 5' 11"  (1.803 m)   Wt 201 lb 12.8 oz (91.5 kg)   SpO2 96%   BMI 28.15 kg/m     Wt Readings from Last 3 Encounters:  08/09/18 201 lb 12.8 oz (91.5 kg)  05/14/18 201 lb (91.2 kg)  05/11/18 201 lb (91.2 kg)     Physical Exam  Constitutional: He is oriented to person, place, and time. He appears well-developed and well-nourished. No distress.  HENT:  Head: Normocephalic and atraumatic.  Eyes: No scleral icterus.  Neck: Neck supple. No JVD present. No thyromegaly present.  Cardiovascular: Normal rate, regular rhythm, S1 normal and S2 normal.  No murmur heard. Pulmonary/Chest: Breath sounds normal. He has no rales.  Abdominal: Soft. There is no hepatomegaly.  Musculoskeletal:        General: No edema.  Lymphadenopathy:    He has no cervical adenopathy.  Neurological: He is alert and oriented to person, place, and time.  Skin: Skin is warm and dry.  Psychiatric: He has a normal mood and affect.    ASSESSMENT & PLAN:    Chronic diastolic CHF (congestive heart failure) ([place])  EF 50 by Echo in 03/2018.  He is NYHA 2.  Volume is stable.  BP is running somewhat low.  He may be minimally symptomatic.    -Decrease Torsemide to 20 mg q MWF  -Decrease K+ to 10 mEq q MWF  -BMET today  -if + swelling, +dyspnea or ? weight, resume daily Torsemide and call  Coronary artery disease involving native coronary artery of native heart without angina pectoris S/p inf MI tx with POBA to RCA and subsequent CABG in 02/2018.  He is doing well without angina. Continue Coreg, Lisinopril, Atorvastatin, ASA, Plavix.  FU in 6 mos.  Plan on [place] Plavix 1 year out from MI.  Postoperative atrial fibrillation (HCC) No apparent  recurrence.  Hyperlipidemia, unspecified hyperlipidemia type   LDL optimal on most recent lab work.  Continue current Rx.    Dispo:  Return in about 6 months (around 02/09/2019) for Routine Follow Up, w/ Dr. Burt Knack, or Richardson Dopp, PA-C.   Medication Adjustments/Labs and Tests Ordered: Current medicines are reviewed at length with the patient today.  Concerns regarding medicines are outlined above.  Tests Ordered: Orders Placed This Encounter  Procedures  . Basic metabolic panel   Medication Changes: Meds ordered this encounter  Medications  . torsemide (DEMADEX) 20 MG tablet    Sig: Take 1 tab every Mon, Wed, Fri    Dispense:  30 tablet    Refill:  11    Order Specific Question:   Supervising Provider    Answer:   Thayer Headings [[id number]]  . potassium chloride (K-DUR) 10 MEQ tablet    Sig: Take 1 tab every Mon, Wed, Fri    Dispense:  30 tablet    Refill:  11    Order Specific Question:   Supervising Provider    Answer:   Thayer Headings [[id number]]    Signed, Richardson Dopp, PA-C  08/09/2018 9:42 AM    [facility] [place], [place], [place]  [address] Phone: [phone]; Fax: [phone]

## 2018-08-10 ENCOUNTER — Ambulatory Visit (INDEPENDENT_AMBULATORY_CARE_PROVIDER_SITE_OTHER): Payer: Federal, State, Local not specified - PPO | Admitting: Physician Assistant

## 2018-08-10 ENCOUNTER — Encounter: Payer: Self-pay | Admitting: Physician Assistant

## 2018-08-10 VITALS — BP 95/62 | HR 69 | Wt 202.0 lb

## 2018-08-10 DIAGNOSIS — I952 Hypotension due to drugs: Secondary | ICD-10-CM | POA: Diagnosis not present

## 2018-08-10 DIAGNOSIS — N183 Chronic kidney disease, stage 3 unspecified: Secondary | ICD-10-CM

## 2018-08-10 DIAGNOSIS — I130 Hypertensive heart and chronic kidney disease with heart failure and stage 1 through stage 4 chronic kidney disease, or unspecified chronic kidney disease: Secondary | ICD-10-CM | POA: Diagnosis not present

## 2018-08-10 NOTE — Patient Instructions (Signed)
Chronic Kidney Disease, Adult  Chronic kidney disease (CKD) occurs when the kidneys become damaged slowly over a long period of time. The kidneys are a pair of organs that do many important jobs in the body, including:  · Removing waste and extra fluid from the blood to make urine.  · Making hormones that maintain the amount of fluid in tissues and blood vessels.  · Maintaining the right amount of fluids and chemicals in the body.  A small amount of kidney damage may not cause problems, but a large amount of damage may make it hard or impossible for the kidneys to work the way they should. If steps are not taken to slow down kidney damage or to stop it from getting worse, the kidneys may stop working permanently (end-stage renal disease or ESRD). Most of the time, CKD does not go away, but it can often be controlled. People who have CKD are usually able to live normal lives.  What are the causes?  The most common causes of this condition are diabetes and high blood pressure (hypertension). Other causes include:  · Heart and blood vessel (cardiovascular) disease.  · Kidney diseases, such as:  ? Glomerulonephritis.  ? Interstitial nephritis.  ? Polycystic kidney disease.  ? Renal vascular disease.  · Diseases that affect the immune system.  · Genetic diseases.  · Medicines that damage the kidneys, such as anti-inflammatory medicines.  · Being around or being in contact with poisonous (toxic) substances.  · A kidney or urinary infection that occurs again and again (recurs).  · Vasculitis. This is swelling or inflammation of the blood vessels.  · A problem with urine flow that may be caused by:  ? Cancer.  ? Having kidney stones more than one time.  ? An enlarged prostate, in males.  What increases the risk?  You are more likely to develop this condition if you:  · Are older than age 60.  · Are male.  · Are African-American, Hispanic, Asian, Pacific Islander, or American Indian.  · Are a current or former  smoker.  · Are obese.  · Have a family history of kidney disease or failure.  · Often take medicines that are damaging to the kidneys.  What are the signs or symptoms?  Symptoms of this condition include:  · Swelling (edema) of the face, legs, ankles, or feet.  · Tiredness (lethargy) and having less energy.  · Nausea or vomiting.  · Confusion or trouble concentrating.  · Problems with urination, such as:  ? Painful or burning feeling during urination.  ? Decreased urine production.  ? Frequent urination, especially at night.  ? Bloody urine.  · Muscle twitches and cramps, especially in the legs.  · Shortness of breath.  · Weakness.  · Loss of appetite.  · Metallic taste in the mouth.  · Trouble sleeping.  · Dry, itchy skin.  · A low blood count (anemia).  · Pale lining of the eyelids and surface of the eye (conjunctiva).  Symptoms develop slowly and may not be obvious until the kidney damage becomes severe. It is possible to have kidney disease for years without having any symptoms.  How is this diagnosed?  This condition may be diagnosed based on:  · Blood tests.  · Urine tests.  · Imaging tests, such as an ultrasound or CT scan.  · A test in which a sample of tissue is removed from the kidneys to be examined under a microscope (kidney biopsy).    These test results will help your health care provider determine how serious the CKD is.  How is this treated?  There is no cure for most cases of this condition, but treatment usually relieves symptoms and prevents or slows the progression of the disease. Treatment may include:  · Making diet changes, which may require you to avoid alcohol, salty foods (sodium), and foods that are high in potassium, calcium, and protein.  · Medicines:  ? To lower blood pressure.  ? To control blood glucose.  ? To relieve anemia.  ? To relieve swelling.  ? To protect your bones.  ? To improve the balance of electrolytes in your blood.  · Removing toxic waste from the body through types of  dialysis, if the kidneys can no longer do their job (kidney failure).  · Managing any other conditions that are causing your CKD or making it worse.  Follow these instructions at home:  Medicines  · Take over-the-counter and prescription medicines only as told by your health care provider. The dose of some medicines that you take may need to be adjusted.  · Do not take any new medicines unless approved by your health care provider. Many medicines can worsen your kidney damage.  · Do not take any vitamin and mineral supplements unless approved by your health care provider. Many nutritional supplements can worsen your kidney damage.  General instructions  · Follow your prescribed diet as told by your health care provider.  · Do not use any products that contain nicotine or tobacco, such as cigarettes and e-cigarettes. If you need help quitting, ask your health care provider.  · Monitor and track your blood pressure at home. Report changes in your blood pressure as told by your health care provider.  · If you are being treated for diabetes, monitor and track your blood sugar (blood glucose) levels as told by your health care provider.  · Maintain a healthy weight. If you need help with this, ask your health care provider.  · Start or continue an exercise plan. Exercise at least 30 minutes a day, 5 days a week.  · Keep your immunizations up to date as told by your health care provider.  · Keep all follow-up visits as told by your health care provider. This is important.  Where to find more information  · American Association of Kidney Patients: www.aakp.org  · National Kidney Foundation: www.kidney.org  · American Kidney Fund: www.akfinc.org  · [employer] [facility]: www.lifeoptions.org and www.kidneyschool.org  Contact a health care provider if:  · Your symptoms get worse.  · You develop new symptoms.  Get help right away if:  · You develop symptoms of ESRD, which include:  ? Headaches.  ? Numbness in the  hands or feet.  ? Easy bruising.  ? Frequent hiccups.  ? Chest pain.  ? Shortness of breath.  ? Lack of menstruation, in women.  · You have a fever.  · You have decreased urine production.  · You have pain or bleeding when you urinate.  Summary  · Chronic kidney disease (CKD) occurs when the kidneys become damaged slowly over a long period of time.  · The most common causes of this condition are diabetes and high blood pressure (hypertension).  · There is no cure for most cases of this condition, but treatment usually relieves symptoms and prevents or slows the progression of the disease. Treatment may include a combination of medicines and lifestyle changes.  This information is   not intended to replace advice given to you by your health care provider. Make sure you discuss any questions you have with your health care provider.  Document Released: 02/19/2008 Document Revised: 06/19/2016 Document Reviewed: 06/19/2016  [employer]vier Interactive Patient Education © 2019 [employer]

## 2018-08-10 NOTE — Progress Notes (Signed)
HPI:                                                                David [name] is a 78 y.o. male who presents to [facility]: [facility] today for follow-up  04/13/2018 This is a pleasant 79 yo M recently hospitalized for STEMI s/p emergent CABG x 310/28/19-11/06/19  In terms of his hospital course, he did have some AKI (GFR 42, Scr 1.56), acute blood loss anemia (Hgb 8.2) and peripheral edema. Echo showed reduced EF 50%, pseudonormal LV filling pattern and grade 2 diastolic dysfunction. He is currently on Lasix 40 mg QD.  He was placed on Doxycycline last week for cellulitis of his left lower extremity at the sight of his saphenous vein graft. His wound care was also increased to twice daily. He is doing well. Denies fever, malaise, wound pain. He endorses some numbness in his left leg.  Also states he is having sleep difficulties. Endorses trouble breathing with lying flat. He is currently sleeping in a recliner with HOB elevated.  04/20/2018 This is a pleasant 78 yo M with PMH significant for recent STEMI s/p CABG (10/19), chronic diastolic HR (EF 23%), post-op anemia, and AKI presenting with breathing difficulties for several weeks. Symptoms are gradually worsening. He states "I cant sleep, can't breathe."  He is sleeping in a recliner and waking up gasping for air States he if he is not propped up in the recliner he will feel like "lungs are filling up" within minutes. He has a right-sided constant chest wall pain and he developed a dry cough today. He was evaluated by his Cardiologist on 04/14/18 He is currently taking Torsemide for CHF and peripheral edema, and his dose was recently adjusted.   05/11/2018 He was evaluated by CT surgeon on 04/26/18 for DOE and left pleural effusion. Repeat CXR showed stability and he did not have any intervention Per CT surgeon, he can d/c Amiodarone after current Rx runs out Reports his breathing is  significantly improved. He still has some DOE, such as with working on his truck. Still living with his daughter. Upcoming appt with Richardson Dopp on 12/20 Has not yet started cardiac rehab.   08/10/2018 David [name] reports he feels great. Still living with his daughter, but he is more independent now and doing all of his usual daily activities NYHA Class 2 HF followed by Cardiology Q36month Torsemide was decreased to 20 mg M, W, F and potassium decreased to 10 mEq MWF They plan to decrease Plavix Oct 2020 (1 year out from MI)  [facility]  Decreased Interest 0  Down, Depressed, Hopeless 0  PHQ - 2 Score 0    No flowsheet data found.    Past Medical History:  Diagnosis Date  . CAD (coronary artery disease) 03/22/2018   S/p Inf STEMI 10/19 >> Tx with POBA to RCA then emergent CABG (LIMA to LAD, Free RIMA to OM, and SVG to PDA) with Dr. ORoxy Manns . [place] diastolic [place]ive [place] 04[place]   Post CABG volume overload req'd Lasix gtt // complicated by AKI // Echo 03/31/18:  Mild LVH, inf AK, EF 50, Gr 2 DD, Asc aorta 39 mm (mildly dilated), mild MAC, trivial MR,  mild LAE, mild dilated RV, normal RVSF, mild RAE, trivial effusion // Echo 03/23/18: Mod conc LVH, EF 50-55, inf AK, normal diastolic function, Asc aorta 40 mm, mild RV dilation   . Hyperlipidemia 04/08/2018  . Postoperative atrial fibrillation (HCC)    Tx with Amiodarone   Past Surgical History:  Procedure Laterality Date  . CORONARY ARTERY BYPASS GRAFT N/A 03/22/2018   Procedure: CORONARY ARTERY BYPASS GRAFTING (CABG) x 3. ENDOSCOPIC HARVESTING OF RIGHT GREATER SPAPHENOUS VEIN AND OPEN HARVESTING OF LEFT GREATER SAPHENOUS VEIN. BILATERAL IMA;  Surgeon: Rexene Alberts, MD;  Location: [facility];  Service: Open Heart Surgery;  Laterality: N/A;  . CORONARY/GRAFT ACUTE MI REVASCULARIZATION N/A 03/22/2018   Procedure: Coronary/Graft Acute MI Revascularization;  Surgeon: Nelva Bush, MD;   Location: [facility];  Service: Cardiovascular;  Laterality: N/A;  . HERNIA REPAIR    . IABP INSERTION N/A 03/22/2018   Procedure: IABP Insertion;  Surgeon: Nelva Bush, MD;  Location: [facility];  Service: Cardiovascular;  Laterality: N/A;  . LEFT HEART CATH AND CORONARY ANGIOGRAPHY N/A 03/22/2018   Procedure: LEFT HEART CATH AND CORONARY ANGIOGRAPHY;  Surgeon: Nelva Bush, MD;  Location: [facility] [place] [facility];  Service: Cardiovascular;  Laterality: N/A;  . TEE WITHOUT CARDIOVERSION N/A 03/22/2018   Procedure: TRANSESOPHAGEAL ECHOCARDIOGRAM (TEE);  Surgeon: Rexene Alberts, MD;  Location: [facility] [place];  Service: Open Heart Surgery;  Laterality: N/A;   Social History   Tobacco Use  . Smoking status: Former Smoker    Types: Cigarettes    Last attempt to quit: 07/03/1977    Years since quitting: 41.1  . Smokeless tobacco: Never Used  Substance Use Topics  . Alcohol use: Never    Frequency: Never   family history includes Rheum arthritis in his brother and mother.    ROS: negative except as noted in the HPI  Medications: Current Outpatient Medications  Medication Sig Dispense Refill  . acetaminophen (TYLENOL) 325 MG tablet Take 650 mg by mouth every 6 (six) hours as needed for mild pain.    Marland Kitchen aspirin EC 81 MG EC tablet Take 1 tablet (81 mg total) by mouth daily.    Marland Kitchen atorvastatin (LIPITOR) 80 MG tablet Take 1 tablet (80 mg total) by mouth daily at 6 PM. 90 tablet 3  . carvedilol (COREG) 3.125 MG tablet Take 1 tablet (3.125 mg total) by mouth 2 (two) times daily with a meal. 180 tablet 3  . clopidogrel (PLAVIX) 75 MG tablet Take 1 tablet (75 mg total) by mouth daily. 90 tablet 3  . Ferrous Sulfate 140 (45 Fe) MG TBCR 1 TAB PO TID WITH MEALS ON MONDAY, WEDNESDAY, FRIDAY 60 tablet 3  . lisinopril (PRINIVIL,ZESTRIL) 2.5 MG tablet Take 1 tablet (2.5 mg total) by mouth daily. 90 tablet 3  . NONFORMULARY OR COMPOUNDED ITEM Overnight pulse oximetry 1 each 0  . Omega-3 Fatty  Acids (FISH OIL) 1000 MG CAPS Take 1,000 mg by mouth daily.    . potassium chloride (K-DUR) 10 MEQ tablet Take 1 tab every Mon, Wed, Fri 30 tablet 11  . Psyllium (METAMUCIL FIBER PO) Take by mouth.    . torsemide (DEMADEX) 20 MG tablet Take 1 tab every Mon, Wed, Fri 30 tablet 11   No current facility-administered medications for this visit.    No Known Allergies     Objective:  BP 95/62   Pulse 69   Wt 202 lb (91.6 kg)   BMI 28.17 kg/m  Gen:  alert,  well-appearing, no distress, appropriate for age 56: head normocephalic without obvious abnormality, conjunctiva and cornea clear, trachea midline Pulm: Normal work of breathing, normal phonation Neuro: alert and oriented x 3, no tremor MSK: extremities atraumatic, normal gait and station Skin: intact, no rashes on exposed skin, no jaundice, no cyanosis Psych: well-groomed, cooperative, good eye contact, euthymic mood, affect mood-congruent, speech is articulate, and thought processes clear and goal-directed  Wt Readings from Last 3 Encounters:  08/10/18 202 lb (91.6 kg)  08/09/18 201 lb 12.8 oz (91.5 kg)  05/14/18 201 lb (91.2 kg)   BP Readings from Last 3 Encounters:  08/10/18 95/62  08/09/18 98/60  05/14/18 112/62     Results for orders placed or performed in visit on 08/09/18 (from the past 72 hour(s))  Basic metabolic panel     Status: Abnormal   Collection Time: 08/09/18  9:36 AM  Result Value Ref Range   Glucose 97 65 - 99 mg/dL   BUN 32 (H) 8 - 27 mg/dL   Creatinine, Ser 1.39 (H) 0.76 - 1.27 mg/dL   GFR calc non Af Amer 49 (L) >59 mL/min/1.73   GFR calc Af Amer 57 (L) >59 mL/min/1.73   BUN/Creatinine Ratio 23 10 - 24   Sodium 140 134 - 144 mmol/L   Potassium 5.0 3.5 - 5.2 mmol/L   Chloride 102 96 - 106 mmol/L   CO2 21 20 - 29 mmol/L   Calcium 9.9 8.6 - 10.2 mg/dL   No results found.    Assessment and Plan: 78 y.o. male with   .Laurence was seen today for follow-up.  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 Cologuard was ordered last OV, but states he never received the kit. We will contact Cologuard today and re-fax order  Patient education and anticipatory guidance given Patient agrees with treatment plan Follow-up in 3 months as needed if symptoms worsen or fail to improve  Darlyne Russian PA-C

## 2018-11-10 ENCOUNTER — Ambulatory Visit: Payer: Federal, State, Local not specified - PPO | Admitting: Physician Assistant

## 2018-11-11 ENCOUNTER — Encounter: Payer: Self-pay | Admitting: Physician Assistant

## 2018-11-11 ENCOUNTER — Ambulatory Visit (INDEPENDENT_AMBULATORY_CARE_PROVIDER_SITE_OTHER): Payer: Federal, State, Local not specified - PPO | Admitting: Physician Assistant

## 2018-11-11 VITALS — BP 111/68 | HR 69 | Temp 98.5°F | Resp 18 | Wt 196.0 lb

## 2018-11-11 DIAGNOSIS — N183 Chronic kidney disease, stage 3 unspecified: Secondary | ICD-10-CM

## 2018-11-11 DIAGNOSIS — I130 Hypertensive heart and chronic kidney disease with heart failure and stage 1 through stage 4 chronic kidney disease, or unspecified chronic kidney disease: Secondary | ICD-10-CM

## 2018-11-11 DIAGNOSIS — N182 Chronic kidney disease, stage 2 (mild): Secondary | ICD-10-CM

## 2018-11-11 NOTE — Patient Instructions (Signed)
Food Basics for Chronic Kidney Disease  When your kidneys are not working well, they cannot remove waste and excess substances from your blood as effectively as they did before. This can lead to a buildup and imbalance of these substances, which can worsen kidney damage and affect how your body functions. Certain foods lead to a buildup of these substances in the body. By changing your diet as recommended by your diet and nutrition specialist (dietitian) or health care provider, you could help prevent further kidney damage and delay or prevent the need for dialysis.  What are tips for following this plan?  General instructions     Work with your health care provider and dietitian to develop a meal plan that is right for you. Foods you can eat, limit, or avoid will be different for each person depending on the stage of kidney disease and any other existing health conditions.   Talk with your health care provider about whether you should take a vitamin and mineral supplement.   Use standard measuring cups and spoons to measure servings of foods. Use a kitchen scale to measure portions of protein foods.   If directed by your health care provider, avoid drinking too much fluid. Measure and count all liquids, including water, ice, soups, flavored gelatin, and frozen desserts such as popsicles or ice cream.  Reading food labels   Check the amount of sodium in foods. Choose foods that have less than 300 milligrams (mg) per serving.   Check the ingredient list for phosphorus or potassium-based additives or preservatives.   Check the amount of saturated and trans fat. Limit or avoid these fats as told by your dietitian.  Shopping   Avoid buying foods that are:  ? Processed, frozen, or prepackaged.  ? Calcium-enriched or fortified.   Do not buy foods that have salt or sodium listed among the first five ingredients.   Do not buy canned vegetables.  Cooking   Replace animal proteins, such as meat, fish, eggs, or  dairy, with plant proteins from beans, nuts, and soy.  ? Use soy milk instead of cow's milk.  ? Add beans or tofu to soups, casseroles, or pasta dishes instead of meat.   Soak vegetables, such as potatoes, before cooking to reduce potassium. To do this:  ? Peel and cut into small pieces.  ? Soak in warm water for at least 2 hours. For every 1 cup of vegetables, use 10 cups of water.  ? Drain and rinse with warm water.  ? Boil for at least 5 minutes.  Meal planning   Limit the amount of protein from plant and animal sources you eat each day.   Do not add salt to food when cooking or before eating.   Eat meals and snacks at around the same time each day.  If you have diabetes:   If you have diabetes (diabetes mellitus) and chronic kidney disease, it is important to keep your blood glucose in the target range recommended by your health care provider. Follow your diabetes management plan. This may include:  ? Checking your blood glucose regularly.  ? Taking oral medicines, insulin, or both.  ? Exercising for at least 30 minutes on 5 or more days each week, or as told by your health care provider.  ? Tracking how many servings of carbohydrates you eat at each meal.   You may be given specific guidelines on how much of certain foods and nutrients you may eat, depending on your   stage of kidney disease and whether you have high blood pressure (hypertension). Follow your meal plan as told by your dietitian.  What nutrients should be limited?  The items listed are not a complete list. Talk with your dietitian about what dietary choices are best for you.  Potassium  Potassium affects how steadily your heart beats. If too much potassium builds up in your blood, it can cause an irregular heartbeat or even a heart attack.  You may need to eat less potassium, depending on your blood potassium levels and the stage of kidney disease. Talk to your dietitian about how much potassium you may have each day.  You may need to limit  or avoid foods that are high in potassium, such as:   Milk and soy milk.   Fruits, such as bananas, papaya, apricots, nectarines, melon, prunes, raisins, kiwi, and oranges.   Vegetables, such as potatoes, sweet potatoes, yams, tomatoes, leafy greens, beets, okra, avocado, pumpkin, and winter squash.   White and lima beans.  Phosphorus  Phosphorus is a mineral found in your bones. A balance between calcium and phosphorous is needed to build and maintain healthy bones. Too much phosphorus pulls calcium from your bones. This can make your bones weak and more likely to break. Too much phosphorus can also make your skin itch.  You may need to eat less phosphorus depending on your blood phosphorus levels and the stage of kidney disease. Talk to your dietitian about how much potassium you may have each day. You may need to take medicine to lower your blood phosphorus levels if diet changes do not help.  You may need to limit or avoid foods that are high in phosphorus, such as:   Milk and dairy products.   Dried beans and peas.   Tofu, soy milk, and other soy-based meat replacements.   Colas.   Nuts and peanut butter.   Meat, poultry, and fish.   Bran cereals and oatmeals.  Protein  Protein helps you to make and keep muscle. It also helps in the repair of your body's cells and tissues. One of the natural breakdown products of protein is a waste product called urea. When your kidneys are not working properly, they cannot remove wastes, such as urea, like they did before you developed chronic kidney disease. Reducing how much protein you eat can help prevent a buildup of urea in your blood.  Depending on your stage of kidney disease, you may need to limit foods that are high in protein. Sources of animal protein include:   Meat (all types).   Fish and seafood.   Poultry.   Eggs.   Dairy.  Other protein foods include:   Beans and legumes.   Nuts and nut butter.   Soy and tofu.  Sodium  Sodium, which is found  in salt, helps maintain a healthy balance of fluids in your body. Too much sodium can increase your blood pressure and have a negative effect on the function of your heart and lungs. Too much sodium can also cause your body to retain too much fluid, making your kidneys work harder.  Most people should have less than 2,300 milligrams (mg) of sodium each day. If you have hypertension, you may need to limit your sodium to 1,500 mg each day. Talk to your dietitian about how much sodium you may have each day.  You may need to limit or avoid foods that are high in sodium, such as:   Salt seasonings.     Soy sauce.   Cured and processed meats.   Salted crackers and snack foods.   Fast food.   Canned soups and most canned foods.   Pickled foods.   Vegetable juice.   Boxed mixes or ready-to-eat boxed meals and side dishes.   Bottled dressings, sauces, and marinades.  Summary   Chronic kidney disease can lead to a buildup and imbalance of waste and excess substances in the body. Certain foods lead to a buildup of these substances. By adjusting your intake of these foods, you could help prevent more kidney damage and delay or prevent the need for dialysis.   Food adjustments are different for each person with chronic kidney disease. Work with a dietitian to set up nutrient goals and a meal plan that is right for you.   If you have diabetes and chronic kidney disease, it is important to keep your blood glucose in the target range recommended by your health care provider.  This information is not intended to replace advice given to you by your health care provider. Make sure you discuss any questions you have with your health care provider.  Document Released: 08/02/2002 Document Revised: 05/07/2016 Document Reviewed: 05/07/2016  [employer]vier Interactive Patient Education  2019 [employer]

## 2018-11-11 NOTE — Progress Notes (Signed)
HPI:                                                                David [name] is a 78 y.o. male who presents to [facility]: [facility] today for follow-up  04/13/2018 This is a pleasant 78 yo M recently hospitalized for STEMI s/p emergent CABG x 310/28/19-11/06/19  In terms of his hospital course, he did have some AKI (GFR 42, Scr 1.56), acute blood loss anemia (Hgb 8.2) and peripheral edema. Echo showed reduced EF 50%, pseudonormal LV filling pattern and grade 2 diastolic dysfunction. He is currently on Lasix 40 mg QD.  He was placed on Doxycycline last week for cellulitis of his left lower extremity at the sight of his saphenous vein graft. His wound care was also increased to twice daily. He is doing well. Denies fever, malaise, wound pain. He endorses some numbness in his left leg.  Also states he is having sleep difficulties. Endorses trouble breathing with lying flat. He is currently sleeping in a recliner with HOB elevated.  04/20/2018 This is a pleasant 78 yo M with PMH significant for recent STEMI s/p CABG (10/19), chronic diastolic HR (EF 36%), post-op anemia, and AKI presenting with breathing difficulties for several weeks. Symptoms are gradually worsening. He states "I cant sleep, can't breathe."  He is sleeping in a recliner and waking up gasping for air States he if he is not propped up in the recliner he will feel like "lungs are filling up" within minutes. He has a right-sided constant chest wall pain and he developed a dry cough today. He was evaluated by his Cardiologist on 04/14/18 He is currently taking Torsemide for CHF and peripheral edema, and his dose was recently adjusted.   05/11/2018 He was evaluated by CT surgeon on 04/26/18 for DOE and left pleural effusion. Repeat CXR showed stability and he did not have any intervention Per CT surgeon, he can d/c Amiodarone after current Rx runs out Reports his breathing is  significantly improved. He still has some DOE, such as with working on his truck. Still living with his daughter. Upcoming appt with Richardson Dopp on 12/20 Has not yet started cardiac rehab.   08/10/2018 [name] reports he feels great. Still living with his daughter, but he is more independent now and doing all of his usual daily activities NYHA Class 2 HF followed by Cardiology Q96month Torsemide was decreased to 20 mg M, W, F and potassium decreased to 10 mEq MWF They plan to decrease Plavix Oct 2020 (1 year out from MI)  11/11/2018 [place] he is doing well. He is back to living independently at home He is compliant with his medications Denies exertional chest pain, palpitations, edema, orthopnea, SOB  Depression screen PHQ 2/9 04/13/2018  Decreased Interest 0  Down, Depressed, Hopeless 0  PHQ - 2 Score 0    Fall Risk  04/13/2018  Falls in the past year? 0     Past Medical History:  Diagnosis Date  . CAD (coronary artery disease) 03/22/2018   S/p Inf STEMI 10/19 >> Tx with POBA to RCA then emergent CABG (LIMA to LAD, Free RIMA to OM, and SVG to PDA) with Dr. ORoxy Manns . Chronic diastolic CHF (congestive heart failure) (  [facility]) 04/08/2018   Post CABG volume overload req'd Lasix gtt // complicated by AKI // Echo 03/31/18:  Mild LVH, inf AK, EF 50, Gr 2 DD, Asc aorta 39 mm (mildly dilated), mild MAC, trivial MR, mild LAE, mild dilated RV, normal RVSF, mild RAE, trivial effusion // Echo 03/23/18: Mod conc LVH, EF 50-55, inf AK, normal diastolic function, Asc aorta 40 mm, mild RV dilation   . Hyperlipidemia 04/08/2018  . Postoperative atrial fibrillation (HCC)    Tx with Amiodarone   Past Surgical History:  Procedure Laterality Date  . CORONARY ARTERY BYPASS GRAFT N/A 03/22/2018   Procedure: CORONARY ARTERY BYPASS GRAFTING (CABG) x 3. ENDOSCOPIC HARVESTING OF RIGHT GREATER SPAPHENOUS VEIN AND OPEN HARVESTING OF LEFT GREATER SAPHENOUS VEIN. BILATERAL IMA;  Surgeon: Rexene Alberts,  MD;  Location: [facility];  Service: Open Heart Surgery;  Laterality: N/A;  . CORONARY/GRAFT ACUTE MI REVASCULARIZATION N/A 03/22/2018   Procedure: Coronary/Graft Acute MI Revascularization;  Surgeon: Nelva Bush, MD;  Location: [facility];  Service: Cardiovascular;  Laterality: N/A;  . HERNIA REPAIR    . IABP INSERTION N/A 03/22/2018   Procedure: IABP Insertion;  Surgeon: Nelva Bush, MD;  Location: [facility];  Service: Cardiovascular;  Laterality: N/A;  . LEFT HEART CATH AND CORONARY ANGIOGRAPHY N/A 03/22/2018   Procedure: LEFT HEART CATH AND CORONARY ANGIOGRAPHY;  Surgeon: Nelva Bush, MD;  Location: [facility];  Service: Cardiovascular;  Laterality: N/A;  . TEE WITHOUT CARDIOVERSION N/A 03/22/2018   Procedure: TRANSESOPHAGEAL ECHOCARDIOGRAM (TEE);  Surgeon: Rexene Alberts, MD;  Location: [facility];  Service: Open Heart Surgery;  Laterality: N/A;   Social History   Tobacco Use  . Smoking status: Former Smoker    Types: Cigarettes    Quit date: 07/03/1977    Years since quitting: 41.3  . Smokeless tobacco: Never Used  Substance Use Topics  . Alcohol use: Never    Frequency: Never   family history includes Rheum arthritis in his brother and mother.    ROS: negative except as noted in the HPI  Medications: Current Outpatient Medications  Medication Sig Dispense Refill  . acetaminophen (TYLENOL) 325 MG tablet Take 650 mg by mouth every 6 (six) hours as needed for mild pain.    Marland Kitchen aspirin EC 81 MG EC tablet Take 1 tablet (81 mg total) by mouth daily.    Marland Kitchen atorvastatin (LIPITOR) 80 MG tablet Take 1 tablet (80 mg total) by mouth daily at 6 PM. 90 tablet 3  . carvedilol (COREG) 3.125 MG tablet Take 1 tablet (3.125 mg total) by mouth 2 (two) times daily with a meal. 180 tablet 3  . clopidogrel (PLAVIX) 75 MG tablet Take 1 tablet (75 mg total) by mouth daily. 90 tablet 3  . Ferrous Sulfate 140 (45 Fe) MG TBCR 1 TAB PO TID WITH MEALS ON MONDAY, WEDNESDAY,  FRIDAY 60 tablet 3  . lisinopril (PRINIVIL,ZESTRIL) 2.5 MG tablet Take 1 tablet (2.5 mg total) by mouth daily. 90 tablet 3  . NONFORMULARY OR COMPOUNDED ITEM Overnight pulse oximetry 1 each 0  . Omega-3 Fatty Acids (FISH OIL) 1000 MG CAPS Take 1,000 mg by mouth daily.    . potassium chloride (K-DUR) 10 MEQ tablet Take 1 tab every Mon, Wed, Fri 30 tablet 11  . Psyllium (METAMUCIL FIBER PO) Take by mouth.    . torsemide (DEMADEX) 20 MG tablet Take 1 tab every Mon, Wed, Fri 30 tablet 11   No current facility-administered medications for  this visit.    No Known Allergies     Objective:  BP 111/68   Pulse 69   Temp 98.5 F (36.9 C) (Oral)   Resp 18   Wt 196 lb (88.9 kg)   SpO2 96%   BMI 27.34 kg/m  Vitals:   11/11/18 1054 11/11/18 1112  BP: 111/68   Pulse: 69   Resp: 18   Temp: 98.5 F (36.9 C)   SpO2: 95% 96%  Gen:  alert, well-appearing, no distress, appropriate for age HEENT: head normocephalic without obvious abnormality, conjunctiva and cornea clear, trachea midline CV: normal rate, regular rhythm, s1 and s2 distinct Pulm: Normal work of breathing, normal phonation, clear to auscultation bilaterally, no wheezes, rales or rhonchi Neuro: alert and oriented x 3, no tremor MSK: extremities atraumatic, normal gait and station, trace peripheral edema Skin: intact, no rashes on exposed skin, no jaundice, no cyanosis Psych: well-groomed, cooperative, good eye contact, euthymic mood, affect mood-congruent, speech is articulate, and thought processes clear and goal-directed  Wt Readings from Last 3 Encounters:  11/11/18 196 lb (88.9 kg)  08/10/18 202 lb (91.6 kg)  08/09/18 201 lb 12.8 oz (91.5 kg)   BP Readings from Last 3 Encounters:  11/11/18 111/68  08/10/18 95/62  08/09/18 98/60   No results found for this or any previous visit (from the past 2160 hour(s)).  Lab Results  Component Value Date   CREATININE 1.39 (H) 08/09/2018   BUN 32 (H) 08/09/2018   NA 140  08/09/2018   K 5.0 08/09/2018   CL 102 08/09/2018   CO2 21 08/09/2018   Lab Results  Component Value Date   CREATININE 1.39 (H) 08/09/2018   CREATININE 1.32 (H) 04/26/2018   CREATININE 1.55 (H) 04/14/2018     No results found for this or any previous visit (from the past 72 hour(s)). No results found.    Assessment and Plan: 78 y.o. male with   .Hollie was seen today for medication management.  Diagnoses and all orders for this visit:  CKD (chronic kidney disease) stage 2, GFR 60-89 ml/min -     Renal Profile with Estimated GFR -     Urine Microalbumin w/creat. 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

## 2018-11-12 LAB — RENAL PROFILE WITH ESTIMATED GFR
Albumin: 4.5 g/dL (ref 3.6–5.1)
BUN: 22 mg/dL (ref 7–25)
CO2: 28 mmol/L (ref 20–32)
Calcium: 9.5 mg/dL (ref 8.6–10.3)
Chloride: 106 mmol/L (ref 98–110)
Creat: 1.12 mg/dL (ref 0.70–1.18)
GFR, Est African American: 74 mL/min/{1.73_m2} (ref >=60)
GFR, Est Non African American: 63 mL/min/{1.73_m2} (ref >=60)
Glucose, Bld: 100 mg/dL — ABNORMAL HIGH (ref 65–99)
Phosphorus: 3 mg/dL (ref 2.1–4.3)
Potassium: 4.5 mmol/L (ref 3.5–5.3)
Sodium: 140 mmol/L (ref 135–146)

## 2018-11-12 LAB — URINALYSIS, MICROSCOPIC ONLY
Bacteria, UA: NONE SEEN /HPF
Hyaline Cast: NONE SEEN /LPF
RBC / HPF: NONE SEEN /HPF (ref 0–2)
Squamous Epithelial / HPF: NONE SEEN /HPF (ref <=5)
WBC, UA: NONE SEEN /HPF (ref 0–5)

## 2018-11-12 LAB — CBC
HCT: 41.2 % (ref 38.5–50.0)
Hemoglobin: 14 g/dL (ref 13.2–17.1)
MCH: 31.7 pg (ref 27.0–33.0)
MCHC: 34 g/dL (ref 32.0–36.0)
MCV: 93.4 fL (ref 80.0–100.0)
MPV: 10.4 fL (ref 7.5–12.5)
Platelets: 218 10*3/uL (ref 140–400)
RBC: 4.41 10*6/uL (ref 4.20–5.80)
RDW: 12.4 % (ref 11.0–15.0)
WBC: 9.2 10*3/uL (ref 3.8–10.8)

## 2018-11-12 LAB — MICROALBUMIN / CREATININE URINE RATIO
Creatinine, Urine: 115 mg/dL (ref 20–320)
Microalb Creat Ratio: 3 mcg/mg creat (ref <30)
Microalb, Ur: 0.4 mg/dL

## 2018-12-09 ENCOUNTER — Other Ambulatory Visit: Payer: Self-pay

## 2018-12-09 ENCOUNTER — Encounter: Payer: Self-pay | Admitting: Physician Assistant

## 2018-12-09 ENCOUNTER — Ambulatory Visit (INDEPENDENT_AMBULATORY_CARE_PROVIDER_SITE_OTHER): Payer: Federal, State, Local not specified - PPO | Admitting: Physician Assistant

## 2018-12-09 VITALS — BP 103/56 | HR 64 | Temp 98.2°F | Wt 193.0 lb

## 2018-12-09 DIAGNOSIS — I5032 Chronic diastolic (congestive) heart failure: Secondary | ICD-10-CM | POA: Diagnosis not present

## 2018-12-09 DIAGNOSIS — I951 Orthostatic hypotension: Secondary | ICD-10-CM

## 2018-12-09 MED ORDER — TORSEMIDE 10 MG PO TABS: ORAL_TABLET | ORAL | Status: DC

## 2018-12-09 NOTE — Patient Instructions (Addendum)
1. Reduce Torsemide (water pill) to 10 mg daily. Cut existing pills in half. I will doublecheck with your Cardiologist that this is okay 2. Do not mow lawn or spend much time outside in the heat on days when you take the Torsemide (Mon, Wed, Fri) because it will dehydrate you 3. Take your time with position changes. Sit on the edge of the sofa or bed for at least 30 seconds before standing up  Orthostatic Hypotension Blood pressure is a measurement of how strongly, or weakly, your blood is pressing against the walls of your arteries. Orthostatic hypotension is a sudden drop in blood pressure that happens when you quickly change positions, such as when you get up from sitting or lying down. Arteries are blood vessels that carry blood from your heart throughout your body. When blood pressure is too low, you may not get enough blood to your brain or to the rest of your organs. This can cause weakness, light-headedness, rapid heartbeat, and fainting. This can last for just a few seconds or for up to a few minutes. Orthostatic hypotension is usually not a serious problem. However, if it happens frequently or gets worse, it may be a sign of something more serious. What are the causes? This condition may be caused by:  Sudden changes in posture, such as standing up quickly after you have been sitting or lying down.  Blood loss.  Loss of body fluids (dehydration).  Heart problems.  Hormone (endocrine) problems.  Pregnancy.  Severe infection.  Lack of certain nutrients.  Severe allergic reactions (anaphylaxis).  Certain medicines, such as blood pressure medicine or medicines that make the body lose excess fluids (diuretics). Sometimes, this condition can be caused by not taking medicine as directed, such as taking too much of a certain medicine. What increases the risk? The following factors may make you more likely to develop this condition:  Age. Risk increases as you get older.   Conditions that affect the heart or the central nervous system.  Taking certain medicines, such as blood pressure medicine or diuretics.  Being pregnant. What are the signs or symptoms? Symptoms of this condition may include:  Weakness.  Light-headedness.  Dizziness.  Blurred vision.  Fatigue.  Rapid heartbeat.  Fainting, in severe cases. How is this diagnosed? This condition is diagnosed based on:  Your medical history.  Your symptoms.  Your blood pressure measurement. Your health care provider will check your blood pressure when you are: ? Lying down. ? Sitting. ? Standing. A blood pressure reading is recorded as two numbers, such as "120 over 80" (or 120/80). The first ("top") number is called the systolic pressure. It is a measure of the pressure in your arteries as your heart beats. The second ("bottom") number is called the diastolic pressure. It is a measure of the pressure in your arteries when your heart relaxes between beats. Blood pressure is measured in a unit called mm Hg. Healthy blood pressure for most adults is 120/80. If your blood pressure is below 90/60, you may be diagnosed with hypotension. Other information or tests that may be used to diagnose orthostatic hypotension include:  Your other vital signs, such as your heart rate and temperature.  Blood tests.  Tilt table test. For this test, you will be safely secured to a table that moves you from a lying position to an upright position. Your heart rhythm and blood pressure will be monitored during the test. How is this treated? This condition may be treated  by:  Changing your diet. This may involve eating more salt (sodium) or drinking more water.  Taking medicines to raise your blood pressure.  Changing the dosage of certain medicines you are taking that might be lowering your blood pressure.  Wearing compression stockings. These stockings help to prevent blood clots and reduce swelling in your  legs. In some cases, you may need to go to the hospital for:  Fluid replacement. This means you will receive fluids through an IV.  Blood replacement. This means you will receive donated blood through an IV (transfusion).  Treating an infection or heart problems, if this applies.  Monitoring. You may need to be monitored while medicines that you are taking wear off. Follow these instructions at home: Eating and drinking   Drink enough fluid to keep your urine pale yellow.  Eat a healthy diet, and follow instructions from your health care provider about eating or drinking restrictions. A healthy diet includes: ? Fresh fruits and vegetables. ? Whole grains. ? Lean meats. ? Low-fat dairy products.  Eat extra salt only as directed. Do not add extra salt to your diet unless your health care provider told you to do that.  Eat frequent, small meals.  Avoid standing up suddenly after eating. Medicines  Take over-the-counter and prescription medicines only as told by your health care provider. ? Follow instructions from your health care provider about changing the dosage of your current medicines, if this applies. ? Do not stop or adjust any of your medicines on your own. General instructions   Wear compression stockings as told by your health care provider.  Get up slowly from lying down or sitting positions. This gives your blood pressure a chance to adjust.  Avoid hot showers and excessive heat as directed by your health care provider.  Return to your normal activities as told by your health care provider. Ask your health care provider what activities are safe for you.  Do not use any products that contain nicotine or tobacco, such as cigarettes, e-cigarettes, and chewing tobacco. If you need help quitting, ask your health care provider.  Keep all follow-up visits as told by your health care provider. This is important. Contact a health care provider if you:  Vomit.  Have  diarrhea.  Have a fever for more than 2-3 days.  Feel more thirsty than usual.  Feel weak and tired. Get help right away if you:  Have chest pain.  Have a fast or irregular heartbeat.  Develop numbness in any part of your body.  Cannot move your arms or your legs.  Have trouble speaking.  Become sweaty or feel light-headed.  Faint.  Feel short of breath.  Have trouble staying awake.  Feel confused. Summary  Orthostatic hypotension is a sudden drop in blood pressure that happens when you quickly change positions.  Orthostatic hypotension is usually not a serious problem.  It is diagnosed by having your blood pressure taken lying down, sitting, and then standing.  It may be treated by changing your diet or adjusting your medicines. This information is not intended to replace advice given to you by your health care provider. Make sure you discuss any questions you have with your health care provider. Document Released: 05/02/2002 Document Revised: 11/05/2017 Document Reviewed: 11/05/2017 Elsevier Patient Education  2020 [employer]

## 2018-12-09 NOTE — Progress Notes (Signed)
HPI:                                                                David [name] is a 78 y.o. male who presents to [facility]: [facility] today for "lightheadedness"  78 yo M with PMH of HFpEF, CAD, STEMI, CKD presents with single episode of  lightheadedness 1 week ago described as "spinning and almost falling out." He reports he felt very lightheaded after getting up quickly from his couch to answer the phone. No associated symptoms incl chest pain, palpations, nausea, headache. There was no syncope/LOC.  Symptoms occurred after spending hours outside mowing 20 acres. He states that he pushes a power mower himself and does this several days per week. He denies dizziness, chest pain, palpitations, or dyspnea while mowing.   Past Medical History:  Diagnosis Date  . CAD (coronary artery disease) 03/22/2018   S/p Inf STEMI 10/19 >> Tx with POBA to RCA then emergent CABG (LIMA to LAD, Free RIMA to OM, and SVG to PDA) with Dr. Roxy Manns  . Chronic diastolic CHF (congestive heart failure) ([place]) 04/08/2018   Post CABG volume overload req'd Lasix gtt // complicated by AKI // Echo 03/31/18:  Mild LVH, inf AK, EF 50, Gr 2 DD, Asc aorta 39 mm (mildly dilated), mild MAC, trivial MR, mild LAE, mild dilated RV, normal RVSF, mild RAE, trivial effusion // Echo 03/23/18: Mod conc LVH, EF 50-55, inf AK, normal diastolic function, Asc aorta 40 mm, mild RV dilation   . Hyperlipidemia 04/08/2018  . Postoperative atrial fibrillation (HCC)    Tx with Amiodarone   Past Surgical History:  Procedure Laterality Date  . CORONARY ARTERY BYPASS GRAFT N/A 03/22/2018   Procedure: CORONARY ARTERY BYPASS GRAFTING (CABG) x 3. ENDOSCOPIC HARVESTING OF RIGHT GREATER SPAPHENOUS VEIN AND OPEN HARVESTING OF LEFT GREATER SAPHENOUS VEIN. BILATERAL IMA;  Surgeon: Rexene Alberts, MD;  Location: [facility];  Service: Open Heart Surgery;  Laterality: N/A;  . CORONARY/GRAFT ACUTE MI REVASCULARIZATION N/A  03/22/2018   Procedure: Coronary/Graft Acute MI Revascularization;  Surgeon: Nelva Bush, MD;  Location: [facility];  Service: Cardiovascular;  Laterality: N/A;  . HERNIA REPAIR    . IABP INSERTION N/A 03/22/2018   Procedure: IABP Insertion;  Surgeon: Nelva Bush, MD;  Location: [facility];  Service: Cardiovascular;  Laterality: N/A;  . LEFT HEART CATH AND CORONARY ANGIOGRAPHY N/A 03/22/2018   Procedure: LEFT HEART CATH AND CORONARY ANGIOGRAPHY;  Surgeon: Nelva Bush, MD;  Location: [facility];  Service: Cardiovascular;  Laterality: N/A;  . TEE WITHOUT CARDIOVERSION N/A 03/22/2018   Procedure: TRANSESOPHAGEAL ECHOCARDIOGRAM (TEE);  Surgeon: Rexene Alberts, MD;  Location: [facility];  Service: Open Heart Surgery;  Laterality: N/A;   Social History   Tobacco Use  . Smoking status: Former Smoker    Types: Cigarettes    Quit date: 07/03/1977    Years since quitting: 41.4  . Smokeless tobacco: Never Used  Substance Use Topics  . Alcohol use: Never    Frequency: Never   family history includes Rheum arthritis in his brother and mother.    ROS: negative except as noted in the HPI  Medications: Current Outpatient Medications  Medication Sig Dispense Refill  .  acetaminophen (TYLENOL) 325 MG tablet Take 650 mg by mouth every 6 (six) hours as needed for mild pain.    Marland Kitchen aspirin EC 81 MG EC tablet Take 1 tablet (81 mg total) by mouth daily.    Marland Kitchen atorvastatin (LIPITOR) 80 MG tablet Take 1 tablet (80 mg total) by mouth daily at 6 PM. 90 tablet 3  . carvedilol (COREG) 3.125 MG tablet Take 1 tablet (3.125 mg total) by mouth 2 (two) times daily with a meal. 180 tablet 3  . clopidogrel (PLAVIX) 75 MG tablet Take 1 tablet (75 mg total) by mouth daily. 90 tablet 3  . Ferrous Sulfate 140 (45 Fe) MG TBCR 1 TAB PO TID WITH MEALS ON MONDAY, WEDNESDAY, FRIDAY 60 tablet 3  . lisinopril (PRINIVIL,ZESTRIL) 2.5 MG tablet Take 1 tablet (2.5 mg total) by mouth daily. 90 tablet  3  . NONFORMULARY OR COMPOUNDED ITEM Overnight pulse oximetry 1 each 0  . Omega-3 Fatty Acids (FISH OIL) 1000 MG CAPS Take 1,000 mg by mouth daily.    . potassium chloride (K-DUR) 10 MEQ tablet Take 1 tab every Mon, Wed, Fri 30 tablet 11  . Psyllium (METAMUCIL FIBER PO) Take by mouth.    . torsemide (DEMADEX) 20 MG tablet Take 1 tab every Mon, Wed, Fri 30 tablet 11   No current facility-administered medications for this visit.    No Known Allergies     Objective:  BP (!) 103/56   Pulse 64   Temp 98.2 F (36.8 C) (Oral)   Wt 193 lb (87.5 kg)   SpO2 98%   BMI 26.92 kg/m  Orthostatic VS for the past 24 hrs:  BP- Lying Pulse- Lying BP- Sitting Pulse- Sitting BP- Standing at 0 minutes Pulse- Standing at 0 minutes  12/09/18 1549 111/69 70 112/71 66 126/78 83   Gen:  alert, not ill-appearing, no distress, appropriate for age 59: head normocephalic without obvious abnormality, conjunctiva and cornea clear, trachea midline Pulm: Normal work of breathing, normal phonation, clear to auscultation bilaterally, no wheezes, rales or rhonchi CV: Normal rate, regular rhythm, s1 and s2 distinct, no murmurs, clicks or rubs  Neuro: alert and oriented x 3, no tremor MSK: extremities atraumatic, normal gait and station Skin: intact, no rashes on exposed skin, no jaundice, no cyanosis  Wt Readings from Last 3 Encounters:  12/09/18 193 lb (87.5 kg)  11/11/18 196 lb (88.9 kg)  08/10/18 202 lb (91.6 kg)    Recent Results (from the past 2160 hour(s))  Renal Profile with Estimated GFR     Status: Abnormal   Collection Time: 11/11/18 11:17 AM  Result Value Ref Range   Glucose, Bld 100 (H) 65 - 99 mg/dL    Comment: .            Fasting reference interval . For someone without known diabetes, a glucose value between 100 and 125 mg/dL is consistent with prediabetes and should be confirmed with a follow-up test. .    BUN 22 7 - 25 mg/dL   Creat 1.12 0.70 - 1.18 mg/dL    Comment: For  patients >58 years of age, the reference limit for Creatinine is approximately 13% higher for people identified as African-American. .    GFR, Est Non African American 63 > OR = 60 mL/min/1.81m   GFR, Est African American 74 > OR = 60 mL/min/1.745m  BUN/Creatinine Ratio NOT APPLICABLE 6 - 22 (calc)   Sodium 140 135 - 146 mmol/L   Potassium 4.5 3.5 -  5.3 mmol/L   Chloride 106 98 - 110 mmol/L   CO2 28 20 - 32 mmol/L   Calcium 9.5 8.6 - 10.3 mg/dL   Phosphorus 3.0 2.1 - 4.3 mg/dL   Albumin 4.5 3.6 - 5.1 g/dL  Urine Microalbumin w/creat. ratio     Status: None   Collection Time: 11/11/18 11:17 AM  Result Value Ref Range   Creatinine, Urine 115 20 - 320 mg/dL   Microalb, Ur 0.4 mg/dL    Comment: Reference Range Not established    Microalb Creat Ratio 3 <30 mcg/mg creat    Comment: . The ADA defines abnormalities in albumin excretion as follows: Marland Kitchen Category         Result (mcg/mg creatinine) . Normal                    <30 Microalbuminuria         30-299  Clinical albuminuria   > OR = 300 . The ADA recommends that at least two of three specimens collected within a 3-6 month period be abnormal before considering a patient to be within a diagnostic category.   Urinalysis, microscopic only     Status: None   Collection Time: 11/11/18 11:17 AM  Result Value Ref Range   WBC, UA NONE SEEN 0 - 5 /HPF   RBC / HPF NONE SEEN 0 - 2 /HPF   Squamous Epithelial / LPF NONE SEEN < OR = 5 /HPF   Bacteria, UA NONE SEEN NONE SEEN /HPF   Hyaline Cast NONE SEEN NONE SEEN /LPF  CBC     Status: None   Collection Time: 11/11/18 11:17 AM  Result Value Ref Range   WBC 9.2 3.8 - 10.8 Thousand/uL   RBC 4.41 4.20 - 5.80 Million/uL   Hemoglobin 14.0 13.2 - 17.1 g/dL   HCT 41.2 38.5 - 50.0 %   MCV 93.4 80.0 - 100.0 fL   MCH 31.7 27.0 - 33.0 pg   MCHC 34.0 32.0 - 36.0 g/dL   RDW 12.4 11.0 - 15.0 %   Platelets 218 140 - 400 Thousand/uL   MPV 10.4 7.5 - 12.5 fL    ECG 12/09/18 15:46 Vent rate  63 bpm PR-I 192 ms QRS 98 ms QT/QTc 398/407 ms NSR, Abnormal ECG  No results found for this or any previous visit (from the past 72 hour(s)). No results found.    Assessment and Plan: 78 y.o. male with   .Diagnoses and all orders for this visit:  Orthostasis  Chronic diastolic CHF (congestive heart failure) (HCC) -     torsemide (DEMADEX) 10 MG tablet; Take 1 tab every Mon, Wed, Fri   Patient's BP is soft today Weight is down an additional 3 lbs compared to 1 month ago No significant orthostasis on exam, but history of lightheadedness with position change and loop diuretic use is consistent with orthostasis Personally reviewed ECG with supervising physican (Dr. Sheppard Coil) No significant changes compared to prior ECG dated 04/12/18. V2 and V3 appear different. Instructed patient to reduce Torsemide to 10 mg MWF Will route a note to Cardiology to doublecheck that reducing Torsemide is okay with his CHF treatment plan Counseled patient not to mow his lawn or exert himself outside on Torsemide days Counseled to take time with position changes  Patient education and anticipatory guidance given Patient agrees with treatment plan Follow-up as needed if symptoms worsen or fail to improve  Darlyne Russian PA-C

## 2019-02-11 ENCOUNTER — Ambulatory Visit (HOSPITAL_BASED_OUTPATIENT_CLINIC_OR_DEPARTMENT_OTHER)
Admission: RE | Admit: 2019-02-11 | Discharge: 2019-02-11 | Disposition: A | Discharge status: Home / Self Care | Payer: Federal, State, Local not specified - PPO | Source: Ambulatory Visit | Attending: Physician Assistant | Admitting: Physician Assistant

## 2019-02-11 ENCOUNTER — Encounter: Payer: Self-pay | Admitting: Physician Assistant

## 2019-02-11 ENCOUNTER — Ambulatory Visit (INDEPENDENT_AMBULATORY_CARE_PROVIDER_SITE_OTHER): Payer: Federal, State, Local not specified - PPO | Admitting: Physician Assistant

## 2019-02-11 ENCOUNTER — Other Ambulatory Visit: Payer: Self-pay

## 2019-02-11 VITALS — BP 95/54 | HR 67 | Temp 98.3°F | Resp 18 | Wt 196.0 lb

## 2019-02-11 DIAGNOSIS — N183 Chronic kidney disease, stage 3 unspecified: Secondary | ICD-10-CM

## 2019-02-11 DIAGNOSIS — I130 Hypertensive heart and chronic kidney disease with heart failure and stage 1 through stage 4 chronic kidney disease, or unspecified chronic kidney disease: Secondary | ICD-10-CM | POA: Diagnosis not present

## 2019-02-11 DIAGNOSIS — I5032 Chronic diastolic (congestive) heart failure: Secondary | ICD-10-CM | POA: Diagnosis not present

## 2019-02-11 DIAGNOSIS — Z5181 Encounter for therapeutic drug level monitoring: Secondary | ICD-10-CM

## 2019-02-11 DIAGNOSIS — R600 Localized edema: Secondary | ICD-10-CM | POA: Insufficient documentation

## 2019-02-11 DIAGNOSIS — Z79899 Other long term (current) drug therapy: Secondary | ICD-10-CM

## 2019-02-11 NOTE — Progress Notes (Signed)
HPI:                                                                David [name] is a 78 y.o. male who presents to [facility]: [facility] today for follow-up orthostasis  78 yo M with PMH of HFpEF, CAD, STEMI, CKD presents with single episode of  lightheadedness 1 week ago described as "spinning and almost falling out." He reports he felt very lightheaded after getting up quickly from his couch to answer the phone. No associated symptoms incl chest pain, palpations, nausea, headache. There was no syncope/LOC.  Symptoms occurred after spending hours outside mowing 20 acres. He states that he pushes a power mower himself and does this several days per week. He denies dizziness, chest pain, palpitations, or dyspnea while mowing.  Today patient reports all symptoms have resolved with Torsemide dose reduction. Denies orthopnea, PND, or worsening edema. Denies change in exercise tolerance.   Past Medical History:  Diagnosis Date  . CAD (coronary artery disease) 03/22/2018   S/p Inf STEMI 10/19 >> Tx with POBA to RCA then emergent CABG (LIMA to LAD, Free RIMA to OM, and SVG to PDA) with Dr. Roxy Manns  . Chronic diastolic CHF (congestive heart failure) ([facility]) 04/08/2018   Post CABG volume overload req'd Lasix gtt // complicated by AKI // Echo 03/31/18:  Mild LVH, inf AK, EF 50, Gr 2 DD, Asc aorta 39 mm (mildly dilated), mild MAC, trivial MR, mild LAE, mild dilated RV, normal RVSF, mild RAE, trivial effusion // Echo 03/23/18: Mod conc LVH, EF 50-55, inf AK, normal diastolic function, Asc aorta 40 mm, mild RV dilation   . Hyperlipidemia 04/08/2018  . Postoperative atrial fibrillation (HCC)    Tx with Amiodarone   Past Surgical History:  Procedure Laterality Date  . CORONARY ARTERY BYPASS GRAFT N/A 03/22/2018   Procedure: CORONARY ARTERY BYPASS GRAFTING (CABG) x 3. ENDOSCOPIC HARVESTING OF RIGHT GREATER SPAPHENOUS VEIN AND OPEN HARVESTING OF LEFT GREATER SAPHENOUS  VEIN. BILATERAL IMA;  Surgeon: Rexene Alberts, MD;  Location: [facility];  Service: Open Heart Surgery;  Laterality: N/A;  . CORONARY/GRAFT ACUTE MI REVASCULARIZATION N/A 03/22/2018   Procedure: Coronary/Graft Acute MI Revascularization;  Surgeon: Nelva Bush, MD;  Location: [facility];  Service: Cardiovascular;  Laterality: N/A;  . HERNIA REPAIR    . IABP INSERTION N/A 03/22/2018   Procedure: IABP Insertion;  Surgeon: Nelva Bush, MD;  Location: [facility];  Service: Cardiovascular;  Laterality: N/A;  . LEFT HEART CATH AND CORONARY ANGIOGRAPHY N/A 03/22/2018   Procedure: LEFT HEART CATH AND CORONARY ANGIOGRAPHY;  Surgeon: Nelva Bush, MD;  Location: [facility];  Service: Cardiovascular;  Laterality: N/A;  . TEE WITHOUT CARDIOVERSION N/A 03/22/2018   Procedure: TRANSESOPHAGEAL ECHOCARDIOGRAM (TEE);  Surgeon: Rexene Alberts, MD;  Location: [facility];  Service: Open Heart Surgery;  Laterality: N/A;   Social History   Tobacco Use  . Smoking status: Former Smoker    Types: Cigarettes    Quit date: 07/03/1977    Years since quitting: 41.6  . Smokeless tobacco: Never Used  Substance Use Topics  . Alcohol use: Never    Frequency: Never   family history includes Rheum arthritis in his brother and  mother.    ROS: negative except as noted in the HPI  Medications: Current Outpatient Medications  Medication Sig Dispense Refill  . acetaminophen (TYLENOL) 325 MG tablet Take 650 mg by mouth every 6 (six) hours as needed for mild pain.    Marland Kitchen aspirin EC 81 MG EC tablet Take 1 tablet (81 mg total) by mouth daily.    Marland Kitchen atorvastatin (LIPITOR) 80 MG tablet Take 1 tablet (80 mg total) by mouth daily at 6 PM. 90 tablet 3  . carvedilol (COREG) 3.125 MG tablet Take 1 tablet (3.125 mg total) by mouth 2 (two) times daily with a meal. 180 tablet 3  . clopidogrel (PLAVIX) 75 MG tablet Take 1 tablet (75 mg total) by mouth daily. 90 tablet 3  . lisinopril (PRINIVIL,ZESTRIL) 2.5  MG tablet Take 1 tablet (2.5 mg total) by mouth daily. 90 tablet 3  . NONFORMULARY OR COMPOUNDED ITEM Overnight pulse oximetry 1 each 0  . Omega-3 Fatty Acids (FISH OIL) 1000 MG CAPS Take 1,000 mg by mouth daily.    . potassium chloride (K-DUR) 10 MEQ tablet Take 1 tab every Mon, Wed, Fri 30 tablet 11  . Psyllium (METAMUCIL FIBER PO) Take by mouth.    . torsemide (DEMADEX) 10 MG tablet Take 1 tab every Mon, Wed, Fri     No current facility-administered medications for this visit.    No Known Allergies     Objective:  BP (!) 95/54   Pulse 67   Temp 98.3 F (36.8 C) (Oral)   Resp 18   Wt 196 lb (88.9 kg)   SpO2 97%   BMI 27.34 kg/m  No data found.  Wt Readings from Last 3 Encounters:  02/11/19 196 lb (88.9 kg)  12/09/18 193 lb (87.5 kg)  11/11/18 196 lb (88.9 kg)   Temp Readings from Last 3 Encounters:  02/11/19 98.3 F (36.8 C) (Oral)  12/09/18 98.2 F (36.8 C) (Oral)  11/11/18 98.5 F (36.9 C) (Oral)   BP Readings from Last 3 Encounters:  02/11/19 (!) 95/54  12/09/18 (!) 103/56  11/11/18 111/68   Pulse Readings from Last 3 Encounters:  02/11/19 67  12/09/18 64  11/11/18 69    Gen:  alert, not ill-appearing, no distress, appropriate for age 46: head normocephalic without obvious abnormality, conjunctiva and cornea clear, trachea midline Pulm: Normal work of breathing, normal phonation, clear to auscultation bilaterally, no wheezes, rales or rhonchi CV: Normal rate, regular rhythm, s1 and s2 distinct, no murmurs, clicks or rubs  Neuro: alert and oriented x 3, no tremor MSK: extremities atraumatic, normal gait and station,2 + peripheral edema of RLE, no edema of the LLE Skin: intact, no rashes on exposed skin, no jaundice, no cyanosis   No results found for this or any previous visit (from the past 2160 hour(s)). Lab Results  Component Value Date   CREATININE 1.12 11/11/2018   BUN 22 11/11/2018   NA 140 11/11/2018   K 4.5 11/11/2018   CL 106 11/11/2018    CO2 28 11/11/2018   Lab Results  Component Value Date   ALT 27 05/14/2018   AST 22 05/14/2018   ALKPHOS 92 05/14/2018   BILITOT 0.5 05/14/2018   Lab Results  Component Value Date   WBC 9.2 11/11/2018   HGB 14.0 11/11/2018   HCT 41.2 11/11/2018   MCV 93.4 11/11/2018   PLT 218 11/11/2018      Assessment and Plan: 78 y.o. male with   .Dwyane was seen today for  hypertension.  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

## 2019-02-11 NOTE — Patient Instructions (Addendum)
Go to [facility] at 3pm today for an ultrasound of your right leg [facility]  Schedule follow- up appointment with Cardiology Richardson Dopp PA-C) this month [phone]   Edema  Edema is an abnormal buildup of fluids in the body tissues and under the skin. Swelling of the legs, feet, and ankles is a common symptom that becomes more likely as you get older. Swelling is also common in looser tissues, like around the eyes. When the affected area is squeezed, the fluid may move out of that spot and leave a dent for a few moments. This dent is called pitting edema. There are many possible causes of edema. Eating too much salt (sodium) and being on your feet or sitting for a long time can cause edema in your legs, feet, and ankles. Hot weather may make edema worse. Common causes of edema include:  Heart failure.  Liver or kidney disease.  Weak leg blood vessels.  Cancer.  An injury.  Pregnancy.  Medicines.  Being obese.  Low protein levels in the blood. Edema is usually painless. Your skin may look swollen or shiny. Follow these instructions at home:  Keep the affected body part raised (elevated) above the level of your heart when you are sitting or lying down.  Do not sit still or stand for long periods of time.  Do not wear tight clothing. Do not wear garters on your upper legs.  Exercise your legs to get your circulation going. This helps to move the fluid back into your blood vessels, and it may help the swelling go down.  Wear elastic bandages or support stockings to reduce swelling as told by your health care provider.  Eat a low-salt (low-sodium) diet to reduce fluid as told by your health care provider.  Depending on the cause of your swelling, you may need to limit how much fluid you drink (fluid restriction).  Take over-the-counter and prescription medicines only as told by your health care provider. Contact a health care  provider if:  Your edema does not get better with treatment.  You have heart, liver, or kidney disease and have symptoms of edema.  You have sudden and unexplained weight gain. Get help right away if:  You develop shortness of breath or chest pain.  You cannot breathe when you lie down.  You develop pain, redness, or warmth in the swollen areas.  You have heart, liver, or kidney disease and suddenly get edema.  You have a fever and your symptoms suddenly get worse. Summary  Edema is an abnormal buildup of fluids in the body tissues and under the skin.  Eating too much salt (sodium) and being on your feet or sitting for a long time can cause edema in your legs, feet, and ankles.  Keep the affected body part raised (elevated) above the level of your heart when you are sitting or lying down. This information is not intended to replace advice given to you by your health care provider. Make sure you discuss any questions you have with your health care provider. Document Released: 05/12/2005 Document Revised: 05/15/2017 Document Reviewed: 06/14/2016 Elsevier Patient Education  2020 [employer].

## 2019-02-11 NOTE — Progress Notes (Signed)
Left detailed voicemail  No evidence of blood clot in the right leg.  The swelling is likely due to the prior vein harvesting he had completed for his open heart surgery

## 2019-02-12 LAB — BASIC METABOLIC PANEL WITH GFR
BUN/Creatinine Ratio: 16 (calc) (ref 6–22)
BUN: 20 mg/dL (ref 7–25)
CO2: 22 mmol/L (ref 20–32)
Calcium: 9.1 mg/dL (ref 8.6–10.3)
Chloride: 110 mmol/L (ref 98–110)
Creat: 1.24 mg/dL — ABNORMAL HIGH (ref 0.70–1.18)
GFR, Est African American: 65 mL/min/{1.73_m2} (ref >=60)
GFR, Est Non African American: 56 mL/min/{1.73_m2} — ABNORMAL LOW (ref >=60)
Glucose, Bld: 114 mg/dL — ABNORMAL HIGH (ref 65–99)
Potassium: 4.4 mmol/L (ref 3.5–5.3)
Sodium: 143 mmol/L (ref 135–146)

## 2019-02-14 NOTE — Progress Notes (Deleted)
Cardiology Office Note    Date:  02/14/2019   ID:  David [name], DOB 08/19/1940, MRN [id number]  PCP:  Trixie Dredge, PA-C  Cardiologist:  Dr. Burt Knack  Chief Complaint: 6 months follow up  History of Present Illness:   David [name] is a 78 y.o. male with hx of CAD s/p CABG, chronic diastolic CHF, post op PAF, HLD presents for follow up.   Hx of coronary artery diseases/p inferior ST elevation MI in 02/2018 treated with POBA of an occluded RCA followed by emergentcoronary artery bypass grafting(LIMA to LAD, Free RIMA to OM, and SVG to PDA) with Dr. Roxy Manns. His post op course was c/b AFib requiring Amiodarone, volume overload, anemia requiring transfusion with PRBCs and AKI.  He continued to have issues with volume overload post DC requiring adjustments in his diuretics.  He was doing well on cardiac stand point when last seen by Richardson Dopp 07/2018.   Past Medical History:  Diagnosis Date  . CAD (coronary artery disease) 03/22/2018   S/p Inf STEMI 10/19 >> Tx with POBA to RCA then emergent CABG (LIMA to LAD, Free RIMA to OM, and SVG to PDA) with Dr. Roxy Manns  . Chronic diastolic CHF (congestive heart failure) ([facility]) 04/08/2018   Post CABG volume overload req'd Lasix gtt // complicated by AKI // Echo 03/31/18:  Mild LVH, inf AK, EF 50, Gr 2 DD, Asc aorta 39 mm (mildly dilated), mild MAC, trivial MR, mild LAE, mild dilated RV, normal RVSF, mild RAE, trivial effusion // Echo 03/23/18: Mod conc LVH, EF 50-55, inf AK, normal diastolic function, Asc aorta 40 mm, mild RV dilation   . Hyperlipidemia 04/08/2018  . Postoperative atrial fibrillation (HCC)    Tx with Amiodarone    Past Surgical History:  Procedure Laterality Date  . CORONARY ARTERY BYPASS GRAFT N/A 03/22/2018   Procedure: CORONARY ARTERY BYPASS GRAFTING (CABG) x 3. ENDOSCOPIC HARVESTING OF RIGHT GREATER SPAPHENOUS VEIN AND OPEN HARVESTING OF LEFT GREATER SAPHENOUS VEIN. BILATERAL IMA;  Surgeon: Rexene Alberts, MD;  Location: [facility];  Service: Open Heart Surgery;  Laterality: N/A;  . CORONARY/GRAFT ACUTE MI REVASCULARIZATION N/A 03/22/2018   Procedure: Coronary/Graft Acute MI Revascularization;  Surgeon: Nelva Bush, MD;  Location: [place] [facility];  Service: Cardiovascular;  Laterality: N/A;  . HERNIA REPAIR    . IABP INSERTION N/A 03/22/2018   Procedure: IABP Insertion;  Surgeon: Nelva Bush, MD;  Location: [place] [facility];  Service: Cardiovascular;  Laterality: N/A;  . LEFT HEART CATH AND CORONARY ANGIOGRAPHY N/A 03/22/2018   Procedure: LEFT HEART CATH AND CORONARY ANGIOGRAPHY;  Surgeon: Nelva Bush, MD;  Location: [place] [facility];  Service: Cardiovascular;  Laterality: N/A;  . TEE WITHOUT CARDIOVERSION N/A 03/22/2018   Procedure: TRANSESOPHAGEAL ECHOCARDIOGRAM (TEE);  Surgeon: Rexene Alberts, MD;  Location: [facility];  Service: Open Heart Surgery;  Laterality: N/A;    Current Medications: Prior to Admission medications   Medication Sig Start Date End Date Taking? Authorizing Provider  acetaminophen (TYLENOL) 325 MG tablet Take 650 mg by mouth every 6 (six) hours as needed for mild pain.    [provider]  aspirin EC 81 MG EC tablet Take 1 tablet (81 mg total) by mouth daily. 04/01/18   Gold, Wilder Glade, PA-C  atorvastatin (LIPITOR) 80 MG tablet Take 1 tablet (80 mg total) by mouth daily at 6 PM. 06/02/18   Sherren Mocha, MD  carvedilol (COREG) 3.125 MG tablet Take 1 tablet (3.125 mg total) by  mouth 2 (two) times daily with a meal. 06/02/18   Sherren Mocha, MD  clopidogrel (PLAVIX) 75 MG tablet Take 1 tablet (75 mg total) by mouth daily. 06/02/18   Sherren Mocha, MD  lisinopril (PRINIVIL,ZESTRIL) 2.5 MG tablet Take 1 tablet (2.5 mg total) by mouth daily. 06/02/18   Sherren Mocha, MD  NONFORMULARY OR COMPOUNDED ITEM Overnight pulse oximetry 04/26/18   Trixie Dredge, PA-C  Omega-3 Fatty Acids (FISH OIL) 1000 MG CAPS Take 1,000 mg by mouth daily.    [provider]  potassium chloride (K-DUR) 10 MEQ tablet Take 1 tab every Mon, Wed, Fri 08/09/18   Richardson Dopp T, PA-C  Psyllium (METAMUCIL FIBER PO) Take by mouth.    [provider]  torsemide (DEMADEX) 10 MG tablet Take 1 tab every Mon, Wed, Fri 12/09/18   Trixie Dredge, PA-C    Allergies:   Patient has no known allergies.   Social History   Socioeconomic History  . Marital status: Divorced    Spouse name: Not on file  . Number of children: Not on file  . Years of education: Not on file  . Highest education level: Not on file  Occupational History  . Not on file  Social Needs  . Financial resource strain: Not on file  . Food insecurity    Worry: Not on file    Inability: Not on file  . Transportation needs    Medical: Not on file    Non-medical: Not on file  Tobacco Use  . Smoking status: Former Smoker    Types: Cigarettes    Quit date: 07/03/1977    Years since quitting: 41.6  . Smokeless tobacco: Never Used  Substance and Sexual Activity  . Alcohol use: Never    Frequency: Never  . Drug use: Never  . Sexual activity: Not Currently  Lifestyle  . Physical activity    Days per week: Not on file    Minutes per session: Not on file  . Stress: Not on file  Relationships  . Social Herbalist on phone: Not on file    Gets together: Not on file    Attends religious service: Not on file    Active member of club or organization: Not on file    Attends meetings of clubs or organizations: Not on file    Relationship status: Not on file  Other Topics Concern  . Not on file  Social History Narrative  . Not on file     Family History:  The patient's family history includes Rheum arthritis in his brother and mother. ***  ROS:   Please see the history of present illness.    ROS All other systems reviewed and are negative.   PHYSICAL EXAM:   VS:  There were no vitals taken for this visit.   GEN: Well nourished, well developed, in no  acute distress  HEENT: normal  Neck: no JVD, carotid bruits, or masses Cardiac: ***RRR; no murmurs, rubs, or gallops,no edema  Respiratory:  clear to auscultation bilaterally, normal work of breathing GI: soft, nontender, nondistended, + BS MS: no deformity or atrophy  Skin: warm and dry, no rash Neuro:  Alert and Oriented x 3, Strength and sensation are intact Psych: euthymic mood, full affect  Wt Readings from Last 3 Encounters:  02/11/19 196 lb (88.9 kg)  12/09/18 193 lb (87.5 kg)  11/11/18 196 lb (88.9 kg)      Studies/Labs Reviewed:   EKG:  EKG is ordered today.  The ekg ordered today demonstrates ***  Recent Labs: 03/26/2018: Magnesium 2.3 05/14/2018: ALT 27 11/11/2018: Hemoglobin 14.0; Platelets 218 02/11/2019: BUN 20; Creat 1.24; Potassium 4.4; Sodium 143   Lipid Panel    Component Value Date/Time   CHOL 146 05/14/2018 1009   TRIG 153 (H) 05/14/2018 1009   HDL 48 05/14/2018 1009   CHOLHDL 3.0 05/14/2018 1009   CHOLHDL 5.6 03/25/2018 0249   VLDL 35 03/25/2018 0249   LDLCALC 67 05/14/2018 1009    Additional studies/ records that were reviewed today include:   Echocardiogram: 03/2018 - Left ventricle: The cavity size was normal. Wall thickness was   increased in a pattern of mild LVH. Basal to mid inferior   akinesis. The estimated ejection fraction was 50%. Features are   consistent with a pseudonormal left ventricular filling pattern,   with concomitant abnormal relaxation and increased filling   pressure (grade 2 diastolic dysfunction). - Aortic valve: There was no stenosis. - Aorta: Ascending aortic diameter: 39 mm (S). - Ascending aorta: The ascending aorta was mildly dilated. - Mitral valve: Mildly calcified annulus. Mildly calcified leaflets   . There was trivial regurgitation. - Left atrium: The atrium was mildly dilated. - Right ventricle: The cavity size was mildly dilated. Systolic   function was normal. - Right atrium: The atrium was mildly  dilated. - Pulmonary arteries: No complete TR doppler jet so unable to   estimate PA systolic pressure. - Systemic veins: IVC measured 2.5 cm with > 50% respirophasic   variation, suggesting RA pressure 8 mmHg. - Pericardium, extracardiac: A trivial pericardial effusion was   identified posterior to the heart.  Impressions:  - Normal LV size with EF 50%. Basal to mid inferior akinesis.   Mildly dilated RV size with normal systolic function. No   significant valvular abnormalities.  Coronary/Graft Acute MI Revascularization  02/2018  IABP Insertion  LEFT HEART CATH AND CORONARY ANGIOGRAPHY  Conclusion  Conclusions: 1. Severe three-vessel CAD, as detailed below.  Culprit lesion is acute plaque rupture and thrombotic subtotal occlusion of the mid RCA with TIMI-1 flow.. 2. Moderately elevated left ventricular filling pressure with LVEF of 35 to 45%.  There is inferior akinesis. 3. Successful PTCA of the mid RCA using a 2.5 x 15 mm balloon with reduction of stenosis from 99% to 30% and restoration of TIMI-3 flow. 4. Successful placement of 50 mL intra-aortic balloon pump via the right common femoral artery.  Recommendations: 1. Patient to go to the operating room for emergent CABG with Dr. Roxy Manns.  We will continue Cangrelor until he proceeds to the operating room. 2. Aggressive secondary prevention, including high intensity statin therapy.  Recommend uninterrupted dual antiplatelet therapy with Aspirin 51m daily and Clopidogrel 758mdaily for a minimum of 12 months (ACS - Class I recommendation).  Initiation of clopidogrel will be deferred until after CABG.       ASSESSMENT & PLAN:   1. CAD s/p CABG 02/2018  2. Chronic diastolic CHF  3. HLD    Medication Adjustments/Labs and Tests Ordered: Current medicines are reviewed at length with the patient today.  Concerns regarding medicines are outlined above.  Medication changes, Labs and Tests ordered today are listed in the  Patient Instructions below. There are no Patient Instructions on file for this visit.   SiJarrett SohoPAUtah9/21/2020 12:46 PM    [facility] [address] [place]  [id number]: [phone]: [phone]  

## 2019-02-15 ENCOUNTER — Ambulatory Visit: Payer: Federal, State, Local not specified - PPO | Admitting: Physician Assistant

## 2019-03-28 ENCOUNTER — Telehealth (INDEPENDENT_AMBULATORY_CARE_PROVIDER_SITE_OTHER): Payer: Federal, State, Local not specified - PPO | Admitting: Thoracic Surgery (Cardiothoracic Vascular Surgery)

## 2019-03-28 ENCOUNTER — Other Ambulatory Visit: Payer: Self-pay

## 2019-03-28 DIAGNOSIS — Z951 Presence of aortocoronary bypass graft: Secondary | ICD-10-CM | POA: Diagnosis not present

## 2019-03-28 NOTE — Patient Instructions (Signed)
Continue all previous medications without any changes at this time  

## 2019-03-28 NOTE — Progress Notes (Signed)
[address]       [place] [address]             [facility] VIRTUAL OFFICE NOTE  Referring Provider is End, Harrell Gave, MD Primary Cardiologist is Sherren Mocha, MD PCP is Trixie Dredge, PA-C   HPI:  I spoke with David [name] (DOB 06/13/1940 ) via telephone on 03/28/2019 at 9:59 AM and verified that I was speaking with the correct person using more than one form of identification.  We discussed the reason(s) for conducting our visit virtually instead of in-person.  The patient expressed understanding the circumstances and agreed to proceed as described.  Patient is 78 year old male who presented with an acute inferior wall ST segment elevation myocardial infarction on March 22, 2018.  He was treated in the Cath Lab with primary balloon angioplasty and placement of intra-aortic balloon pump.  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  His early postoperative recovery was notable for acute combined systolic and diastolic congestive heart failure, postoperative atrial fibrillation treated with amiodarone, and acute kidney injury.  He slowly recovered and ultimately was discharged home in sinus rhythm on the ninth postoperative day.   Since hospital discharge she has done very well.  Routine follow-up echocardiogram performed [name] 6, 2019 revealed low normal left ventricular systolic function with ejection fraction estimated 50%.  He was last seen here in our office on April 26, 2018.  Since then he has participated and completed the cardiac rehab program.  He has been seen at [facility] by David [name] on 2 occasions.  I spoke with the patient over the telephone today any reports that he is doing very well.  Overall he states that he feels "much improved" in comparison with how he felt before his heart attack.  He is back to  normal activity and he reports no specific physical limitations.  He specifically denies any symptoms of exertional shortness of breath or chest discomfort.  Overall he is very pleased with his outcome.  He states his medications have not changed.  He is not certain whether or not he is still taking Plavix.     Current Outpatient Medications  Medication Sig Dispense Refill  . acetaminophen (TYLENOL) 325 MG tablet Take 650 mg by mouth every 6 (six) hours as needed for mild pain.    Marland Kitchen aspirin EC 81 MG EC tablet Take 1 tablet (81 mg total) by mouth daily.    Marland Kitchen atorvastatin (LIPITOR) 80 MG tablet Take 1 tablet (80 mg total) by mouth daily at 6 PM. 90 tablet 3  . carvedilol (COREG) 3.125 MG tablet Take 1 tablet (3.125 mg total) by mouth 2 (two) times daily with a meal. 180 tablet 3  . clopidogrel (PLAVIX) 75 MG tablet Take 1 tablet (75 mg total) by mouth daily. 90 tablet 3  . lisinopril (PRINIVIL,ZESTRIL) 2.5 MG tablet Take 1 tablet (2.5 mg total) by mouth daily. 90 tablet 3  . NONFORMULARY OR COMPOUNDED ITEM Overnight pulse oximetry 1 each 0  . Omega-3 Fatty Acids (FISH OIL) 1000 MG CAPS Take 1,000 mg by mouth daily.    . potassium chloride (K-DUR) 10 MEQ tablet Take 1 tab every Mon, Wed, Fri 30 tablet 11  . Psyllium (METAMUCIL FIBER PO) Take by mouth.    . torsemide (DEMADEX) 10 MG tablet Take 1 tab every Mon,  Wed, Fri     No current facility-administered medications for this visit.      Diagnostic Tests:  n/a   Impression:  Patient is doing very well approximately 1 year status post emergency coronary artery bypass grafting following acute inferior wall ST segment elevation myocardial infarction.    Plan:  We have not recommended any changes to the patient's current medications.  The patient will continue to follow-up with Dr. Excell Seltzer and colleagues at [facility] and he will call and return to the our office in the future only should specific problems or questions arise.    I  discussed limitations of evaluation and management via telephone.  The patient was advised to call back for repeat telephone consultation or to seek an in-person evaluation if questions arise or the patient's clinical condition changes in any significant manner.  I spent in excess of 5 minutes of non-face-to-face time during the conduct of this telephone virtual office consultation.     Salvatore Decent. Cornelius Moras, MD 03/28/2019 9:59 AM

## 2019-04-13 ENCOUNTER — Encounter: Payer: Self-pay | Admitting: Osteopathic Medicine

## 2019-04-13 ENCOUNTER — Other Ambulatory Visit: Payer: Self-pay

## 2019-04-13 ENCOUNTER — Ambulatory Visit (INDEPENDENT_AMBULATORY_CARE_PROVIDER_SITE_OTHER): Payer: Federal, State, Local not specified - PPO | Admitting: Osteopathic Medicine

## 2019-04-13 VITALS — BP 121/68 | HR 65 | Temp 98.1°F | Wt 196.0 lb

## 2019-04-13 DIAGNOSIS — I5032 Chronic diastolic (congestive) heart failure: Secondary | ICD-10-CM

## 2019-04-13 DIAGNOSIS — Z951 Presence of aortocoronary bypass graft: Secondary | ICD-10-CM

## 2019-04-13 DIAGNOSIS — I2510 Atherosclerotic heart disease of native coronary artery without angina pectoris: Secondary | ICD-10-CM

## 2019-04-13 NOTE — Progress Notes (Signed)
HPI: David [name] is a 78 y.o. male who  has a past medical history of CAD (coronary artery disease) (03/22/2018), Chronic diastolic CHF (congestive heart failure) ([place]) (04/08/2018), Hyperlipidemia (04/08/2018), and Postoperative atrial fibrillation ([employer]).  he presents to [facility] today, 04/13/19,  for chief complaint of:  "Check-up" and establish care w/ new PCP  Doing well, dizziness spells have resolved since he has reduced dose of torsemide from 20 mg daily to 10 mg Monday Wednesday Friday.  He states he is following with cardiology, he thinks he may have gotten a no-show charge in error from them, since one of his visits was converted from an in person to a phone visit.  This is his greatest concern today, otherwise he feels fine.  Most recent cardiology notes reviewed from 03/28/2019.  Patient was doing well at that time and no changes were made to medications.   At today's visit 04/13/19 ... PMH, PSH, FH reviewed and updated as needed.  Current medication list and allergy/intolerance hx reviewed and updated as needed. (See remainder of HPI, ROS, Phys Exam below)   No results found.  No results found for this or any previous visit (from the past 72 hour(s)).        ASSESSMENT/PLAN: The primary encounter diagnosis was Coronary artery disease involving native coronary artery of native heart without angina pectoris. Diagnoses of Chronic diastolic CHF (congestive heart failure) (HCC) and S/P CABG x 3 were also pertinent to this visit.   Doing well, no changes      Follow-up plan: Return in about 6 months (around 10/11/2019) for recheck BP and refill meds, consider labs next time unless cardiology does labs sooner!  .                                                 ################################################# ################################################# ################################################# #################################################    Current Meds  Medication Sig  . acetaminophen (TYLENOL) 325 MG tablet Take 650 mg by mouth every 6 (six) hours as needed for mild pain.  Marland Kitchen aspirin EC 81 MG EC tablet Take 1 tablet (81 mg total) by mouth daily.  Marland Kitchen atorvastatin (LIPITOR) 80 MG tablet Take 1 tablet (80 mg total) by mouth daily at 6 PM.  . carvedilol (COREG) 3.125 MG tablet Take 1 tablet (3.125 mg total) by mouth 2 (two) times daily with a meal.  . clopidogrel (PLAVIX) 75 MG tablet Take 1 tablet (75 mg total) by mouth daily.  Marland Kitchen lisinopril (PRINIVIL,ZESTRIL) 2.5 MG tablet Take 1 tablet (2.5 mg total) by mouth daily.  . NONFORMULARY OR COMPOUNDED ITEM Overnight pulse oximetry  . Omega-3 Fatty Acids (FISH OIL) 1000 MG CAPS Take 1,000 mg by mouth daily.  . potassium chloride (K-DUR) 10 MEQ tablet Take 1 tab every Mon, Wed, Fri  . Psyllium (METAMUCIL FIBER PO) Take by mouth.  . torsemide (DEMADEX) 10 MG tablet Take 1 tab every Mon, Wed, Fri    No Known Allergies     Review of Systems:  Constitutional: No recent illness  HEENT: No  headache, no vision change  Cardiac: No  chest pain, No  pressure, No palpitations  Respiratory:  No  shortness of breath. No  Cough  Gastrointestinal: No  abdominal pain  Musculoskeletal: No new myalgia/arthralgia  Skin: No  Rash  Neurologic: No  weakness, No  Dizziness  Psychiatric: No  concerns with depression, No  concerns with anxiety  Exam:  BP 121/68 (BP Location: Right Arm, Patient Position: Sitting, Cuff Size: Normal)   Pulse 65   Temp 98.1 F (36.7 C) (Oral)   Wt 196 lb 0.6 oz (88.9 kg)   BMI 27.34 kg/m   Constitutional: VS see above. General Appearance: alert,  well-developed, well-nourished, NAD  Eyes: Normal lids and conjunctive, non-icteric sclera  Neck: No masses, trachea midline.   Respiratory: Normal respiratory effort. no wheeze, no rhonchi, no rales  Cardiovascular: S1/S2 normal, no murmur, no rub/gallop auscultated. RRR.   Musculoskeletal: Gait normal. Symmetric and independent movement of all extremities  Neurological: Normal balance/coordination. No tremor.  Skin: warm, dry, intact.   Psychiatric: Normal judgment/insight. Normal mood and affect. Oriented x3.       Visit summary with medication list and pertinent instructions was printed for patient to review, patient was advised to alert [place] if any updates are needed. All questions at time of visit were answered - patient instructed to contact office with any additional concerns. ER/RTC precautions were reviewed with the patient and understanding verbalized.   Note: Total time spent 15 minutes, greater than 50% of the visit was spent face-to-face counseling and coordinating care for the following: The primary encounter diagnosis was Coronary artery disease involving native coronary artery of native heart without angina pectoris. Diagnoses of Chronic diastolic CHF (congestive heart failure) (HCC) and S/P CABG x 3 were also pertinent to this visit.Marland Kitchen  Please note: voice recognition software was used to produce this document, and typos may escape review. Please contact Dr. Lyn Hollingshead for any needed clarifications.    Follow up plan: Return in about 6 months (around 10/11/2019) for recheck BP and refill meds, consider labs next time unless cardiology does labs sooner! Marland Kitchen

## 2019-05-11 ENCOUNTER — Other Ambulatory Visit: Payer: Self-pay | Admitting: Cardiovascular Disease

## 2019-05-12 ENCOUNTER — Other Ambulatory Visit: Payer: Self-pay | Admitting: Physician Assistant

## 2019-06-27 ENCOUNTER — Encounter: Payer: Self-pay | Admitting: Nurse Practitioner

## 2019-06-27 ENCOUNTER — Other Ambulatory Visit: Payer: Self-pay

## 2019-06-27 ENCOUNTER — Ambulatory Visit (INDEPENDENT_AMBULATORY_CARE_PROVIDER_SITE_OTHER): Payer: Federal, State, Local not specified - PPO | Admitting: Nurse Practitioner

## 2019-06-27 VITALS — BP 130/76 | HR 74 | Temp 97.7°F | Ht 71.0 in | Wt 200.2 lb

## 2019-06-27 DIAGNOSIS — E785 Hyperlipidemia, unspecified: Secondary | ICD-10-CM | POA: Diagnosis not present

## 2019-06-27 DIAGNOSIS — I5032 Chronic diastolic (congestive) heart failure: Secondary | ICD-10-CM | POA: Diagnosis not present

## 2019-06-27 MED ORDER — CARVEDILOL 3.125 MG PO TABS: 3.1250 mg | ORAL_TABLET | Freq: Two times a day (BID) | ORAL | 3 refills | Status: DC

## 2019-06-27 MED ORDER — ATORVASTATIN CALCIUM 80 MG PO TABS: 80.0000 mg | ORAL_TABLET | Freq: Every day | ORAL | 3 refills | Status: DC

## 2019-06-27 NOTE — Patient Instructions (Addendum)
Fat and Cholesterol Restricted Eating Plan Getting too much fat and cholesterol in your diet may cause health problems. Choosing the right foods helps keep your fat and cholesterol at normal levels. This can keep you from getting certain diseases. Your doctor may recommend an eating plan that includes:  Total fat: ______% or less of total calories a day.  Saturated fat: ______% or less of total calories a day.  Cholesterol: less than _________mg a day.  Fiber: ______g a day. What are tips for following this plan? Meal planning  At meals, divide your plate into four equal parts: ? Fill one-half of your plate with vegetables and green salads. ? Fill one-fourth of your plate with whole grains. ? Fill one-fourth of your plate with low-fat (lean) protein foods.  Eat fish that is high in omega-3 fats at least two times a week. This includes mackerel, tuna, sardines, and salmon.  Eat foods that are high in fiber, such as whole grains, beans, apples, broccoli, carrots, peas, and barley. General tips   Work with your doctor to lose weight if you need to.  Avoid: ? Foods with added sugar. ? Fried foods. ? Foods with partially hydrogenated oils.  Limit alcohol intake to no more than 1 drink a day for nonpregnant women and 2 drinks a day for men. One drink equals 12 oz of beer, 5 oz of wine, or 1 oz of hard liquor. Reading food labels  Check food labels for: ? Trans fats. ? Partially hydrogenated oils. ? Saturated fat (g) in each serving. ? Cholesterol (mg) in each serving. ? Fiber (g) in each serving.  Choose foods with healthy fats, such as: ? Monounsaturated fats. ? Polyunsaturated fats. ? Omega-3 fats.  Choose grain products that have whole grains. Look for the word "whole" as the first word in the ingredient list. Cooking  Cook foods using low-fat methods. These include baking, boiling, grilling, and broiling.  Eat more home-cooked foods. Eat at restaurants and buffets  less often.  Avoid cooking using saturated fats, such as butter, cream, palm oil, palm kernel oil, and coconut oil. Recommended foods  Fruits  All fresh, canned (in natural juice), or frozen fruits. Vegetables  Fresh or frozen vegetables (raw, steamed, roasted, or grilled). Green salads. Grains  Whole grains, such as whole wheat or whole grain breads, crackers, cereals, and pasta. Unsweetened oatmeal, bulgur, barley, quinoa, or brown rice. Corn or whole wheat flour tortillas. Meats and other protein foods  Ground beef (85% or leaner), grass-fed beef, or beef trimmed of fat. Skinless chicken or turkey. Ground chicken or turkey. Pork trimmed of fat. All fish and seafood. Egg whites. Dried beans, peas, or lentils. Unsalted nuts or seeds. Unsalted canned beans. Nut butters without added sugar or oil. Dairy  Low-fat or nonfat dairy products, such as skim or 1% milk, 2% or reduced-fat cheeses, low-fat and fat-free ricotta or cottage cheese, or plain low-fat and nonfat yogurt. Fats and oils  Tub margarine without trans fats. Light or reduced-fat mayonnaise and salad dressings. Avocado. Olive, canola, sesame, or safflower oils. The items listed above may not be a complete list of foods and beverages you can eat. Contact a dietitian for more information. Foods to avoid Fruits  Canned fruit in heavy syrup. Fruit in cream or butter sauce. Fried fruit. Vegetables  Vegetables cooked in cheese, cream, or butter sauce. Fried vegetables. Grains  White bread. White pasta. White rice. Cornbread. Bagels, pastries, and croissants. Crackers and snack foods that contain trans fat   and hydrogenated oils. Meats and other protein foods  Fatty cuts of meat. Ribs, chicken wings, bacon, sausage, bologna, salami, chitterlings, fatback, hot dogs, bratwurst, and packaged lunch meats. Liver and organ meats. Whole eggs and egg yolks. Chicken and turkey with skin. Fried meat. Dairy  Whole or 2% milk, cream,  half-and-half, and cream cheese. Whole milk cheeses. Whole-fat or sweetened yogurt. Full-fat cheeses. Nondairy creamers and whipped toppings. Processed cheese, cheese spreads, and cheese curds. Beverages  Alcohol. Sugar-sweetened drinks such as sodas, lemonade, and fruit drinks. Fats and oils  Butter, stick margarine, lard, shortening, ghee, or bacon fat. Coconut, palm kernel, and palm oils. Sweets and desserts  Corn syrup, sugars, honey, and molasses. Candy. Jam and jelly. Syrup. Sweetened cereals. Cookies, pies, cakes, donuts, muffins, and ice cream. The items listed above may not be a complete list of foods and beverages you should avoid. Contact a dietitian for more information. Summary  Choosing the right foods helps keep your fat and cholesterol at normal levels. This can keep you from getting certain diseases.  At meals, fill one-half of your plate with vegetables and green salads.  Eat high-fiber foods, like whole grains, beans, apples, carrots, peas, and barley.  Limit added sugar, saturated fats, alcohol, and fried foods. This information is not intended to replace advice given to you by your health care provider. Make sure you discuss any questions you have with your health care provider. Document Revised: 01/13/2018 Document Reviewed: 01/27/2017 Elsevier Patient Education  2020 [employer]vier Inc.  

## 2019-06-27 NOTE — Progress Notes (Signed)
Established Patient Office Visit  Subjective:  Patient ID: David [name], male    DOB: March 20, 1941  Age: 79 y.o. MRN: [id number]  CC: HLD CHF Follow-up with med refills  HPI David [name] presents for follow-up for CHF and HLD. He is requesting medication refills today on carvedilol and atorvastatin.   HYPERTENSION / HYPERLIPIDEMIA Satisfied with current treatment? yes Duration of hypertension: chronic BP monitoring frequency: not checking BP medication side effects: no Duration of hyperlipidemia: chronic Cholesterol medication side effects: no Cholesterol supplements: none Medication compliance: good compliance- he reports he has been out of medication for about a week.  Aspirin: yes Recent stressors: no Recurrent headaches: no Visual changes: eye appointment tomorrow for cataract Palpitations: yes - occasionally Dyspnea: no Chest pain: no Lower extremity edema: yes- occasionally in the right leg Dizzy/lightheaded: no   Past Medical History:  Diagnosis Date  . CAD (coronary artery disease) 03/22/2018   S/p Inf STEMI 10/19 >> Tx with POBA to RCA then emergent CABG (LIMA to LAD, Free RIMA to OM, and SVG to PDA) with Dr. Roxy Manns  . Chronic diastolic CHF (congestive heart failure) ([place]) 04/08/2018   Post CABG volume overload req'd Lasix gtt // complicated by AKI // Echo 03/31/18:  Mild LVH, inf AK, EF 50, Gr 2 DD, Asc aorta 39 mm (mildly dilated), mild MAC, trivial MR, mild LAE, mild dilated RV, normal RVSF, mild RAE, trivial effusion // Echo 03/23/18: Mod conc LVH, EF 50-55, inf AK, normal diastolic function, Asc aorta 40 mm, mild RV dilation   . Hyperlipidemia 04/08/2018  . Postoperative atrial fibrillation (HCC)    Tx with Amiodarone    Past Surgical History:  Procedure Laterality Date  . CORONARY ARTERY BYPASS GRAFT N/A 03/22/2018   Procedure: CORONARY ARTERY BYPASS GRAFTING (CABG) x 3. ENDOSCOPIC HARVESTING OF RIGHT GREATER SPAPHENOUS VEIN AND OPEN HARVESTING OF LEFT  GREATER SAPHENOUS VEIN. BILATERAL IMA;  Surgeon: Rexene Alberts, MD;  Location: [facility];  Service: Open Heart Surgery;  Laterality: N/A;  . CORONARY/GRAFT ACUTE MI REVASCULARIZATION N/A 03/22/2018   Procedure: Coronary/Graft Acute MI Revascularization;  Surgeon: Nelva Bush, MD;  Location: [facility];  Service: Cardiovascular;  Laterality: N/A;  . HERNIA REPAIR    . IABP INSERTION N/A 03/22/2018   Procedure: IABP Insertion;  Surgeon: Nelva Bush, MD;  Location: [facility];  Service: Cardiovascular;  Laterality: N/A;  . LEFT HEART CATH AND CORONARY ANGIOGRAPHY N/A 03/22/2018   Procedure: LEFT HEART CATH AND CORONARY ANGIOGRAPHY;  Surgeon: Nelva Bush, MD;  Location: [facility] [place] [facility];  Service: Cardiovascular;  Laterality: N/A;  . TEE WITHOUT CARDIOVERSION N/A 03/22/2018   Procedure: TRANSESOPHAGEAL ECHOCARDIOGRAM (TEE);  Surgeon: Rexene Alberts, MD;  Location: [facility];  Service: Open Heart Surgery;  Laterality: N/A;    Family History  Problem Relation Age of Onset  . Rheum arthritis Mother   . Rheum arthritis Brother     Social History   Socioeconomic History  . Marital status: Divorced    Spouse name: Not on file  . Number of children: Not on file  . Years of education: Not on file  . Highest education level: Not on file  Occupational History  . Not on file  Tobacco Use  . Smoking status: Former Smoker    Types: Cigarettes    Quit date: 07/03/1977    Years since quitting: 42.0  . Smokeless tobacco: Never Used  Substance and Sexual Activity  . Alcohol use: Never  . Drug use: Never  .  Sexual activity: Not Currently  Other Topics Concern  . Not on file  Social History Narrative  . Not on file   Social Determinants of Health   Financial Resource Strain:   . Difficulty of Paying Living Expenses: Not on file  Food Insecurity:   . Worried About Charity fundraiser in the Last Year: Not on file  . Ran Out of Food in the Last Year: Not on file   Transportation Needs:   . Lack of Transportation (Medical): Not on file  . Lack of Transportation (Non-Medical): Not on file  Physical Activity:   . Days of Exercise per Week: Not on file  . Minutes of Exercise per Session: Not on file  Stress:   . Feeling of Stress : Not on file  Social Connections:   . Frequency of Communication with Friends and Family: Not on file  . Frequency of Social Gatherings with Friends and Family: Not on file  . Attends Religious Services: Not on file  . Active Member of Clubs or Organizations: Not on file  . Attends Archivist Meetings: Not on file  . Marital Status: Not on file  Intimate Partner Violence:   . Fear of Current or Ex-Partner: Not on file  . Emotionally Abused: Not on file  . Physically Abused: Not on file  . Sexually Abused: Not on file    Outpatient Medications Prior to Visit  Medication Sig Dispense Refill  . acetaminophen (TYLENOL) 325 MG tablet Take 650 mg by mouth every 6 (six) hours as needed for mild pain.    Marland Kitchen aspirin EC 81 MG EC tablet Take 1 tablet (81 mg total) by mouth daily.    Marland Kitchen atorvastatin (LIPITOR) 80 MG tablet Take 1 tablet (80 mg total) by mouth daily at 6 PM. 90 tablet 3  . carvedilol (COREG) 3.125 MG tablet Take 1 tablet (3.125 mg total) by mouth 2 (two) times daily with a meal. 180 tablet 3  . clopidogrel (PLAVIX) 75 MG tablet TAKE 1 TABLET BY MOUTH EVERY DAY 90 tablet 1  . lisinopril (PRINIVIL,ZESTRIL) 2.5 MG tablet Take 1 tablet (2.5 mg total) by mouth daily. 90 tablet 3  . NONFORMULARY OR COMPOUNDED ITEM Overnight pulse oximetry 1 each 0  . Omega-3 Fatty Acids (FISH OIL) 1000 MG CAPS Take 1,000 mg by mouth daily.    . potassium chloride (K-DUR) 10 MEQ tablet Take 1 tab every Mon, Wed, Fri 30 tablet 11  . Psyllium (METAMUCIL FIBER PO) Take by mouth.    . torsemide (DEMADEX) 10 MG tablet Take 1 tab every Mon, Wed, Fri     No facility-administered medications prior to visit.    No Known  Allergies  ROS Review of Systems  Constitutional: Negative for activity change and fatigue.  Respiratory: Negative for cough, chest tightness and shortness of breath.   Cardiovascular: Positive for palpitations. Negative for chest pain and leg swelling.  Gastrointestinal: Negative for abdominal distention and abdominal pain.  Musculoskeletal: Negative for arthralgias and myalgias.  Neurological: Negative for dizziness, syncope, weakness, light-headedness and headaches.      Objective:    Physical Exam  Constitutional: He is oriented to person, place, and time. He appears well-developed and well-nourished.  Neck: No JVD present. Carotid bruit is not present. No thyromegaly present.  Cardiovascular: Normal rate, regular rhythm, S1 normal, S2 normal, normal heart sounds and intact distal pulses. PMI is not displaced.  No murmur heard. Pulmonary/Chest: Effort normal and breath sounds normal. He has  no wheezes.  Abdominal: Soft. Bowel sounds are normal. He exhibits no distension. There is no abdominal tenderness.  Musculoskeletal:        General: No edema.  Lymphadenopathy:    He has no cervical adenopathy.  Neurological: He is alert and oriented to person, place, and time.  Skin: Skin is warm, dry and intact.  Psychiatric: He has a normal mood and affect. His behavior is normal.  Nursing note and vitals reviewed.  There were no vitals taken for this visit. Wt Readings from Last 3 Encounters:  04/13/19 196 lb 0.6 oz (88.9 kg)  02/11/19 196 lb (88.9 kg)  12/09/18 193 lb (87.5 kg)     Health Maintenance Due  Topic Date Due  . TETANUS/TDAP  07/03/1961  . PNA vac Low Risk Adult (1 of 2 - PCV13) 07/04/2007    There are no preventive care reminders to display for this patient.  No results found for: TSH Lab Results  Component Value Date   WBC 9.2 11/11/2018   HGB 14.0 11/11/2018   HCT 41.2 11/11/2018   MCV 93.4 11/11/2018   PLT 218 11/11/2018   Lab Results  Component  Value Date   NA 143 02/11/2019   K 4.4 02/11/2019   CO2 22 02/11/2019   GLUCOSE 114 (H) 02/11/2019   BUN 20 02/11/2019   CREATININE 1.24 (H) 02/11/2019   BILITOT 0.5 05/14/2018   ALKPHOS 92 05/14/2018   AST 22 05/14/2018   ALT 27 05/14/2018   PROT 7.3 05/14/2018   ALBUMIN 4.4 05/14/2018   CALCIUM 9.1 02/11/2019   ANIONGAP 3 (L) 03/31/2018   Lab Results  Component Value Date   CHOL 146 05/14/2018   Lab Results  Component Value Date   HDL 48 05/14/2018   Lab Results  Component Value Date   LDLCALC 67 05/14/2018   Lab Results  Component Value Date   TRIG 153 (H) 05/14/2018   Lab Results  Component Value Date   CHOLHDL 3.0 05/14/2018   Lab Results  Component Value Date   HGBA1C 5.4 03/22/2018      Assessment & Plan:   1. Hyperlipidemia, unspecified hyperlipidemia type It has been a little over a year since patient has had monitoring of hyperlipidemia.  Will obtain labs today. Atorvastatin refilled. Patient provided with education on heart healthy diet. Follow-up with Dr. Sheppard Coil or sooner if needed.  Patient reports appointment is scheduled for May. - Lipid panel - Comprehensive metabolic panel - atorvastatin (LIPITOR) 80 MG tablet; Take 1 tablet (80 mg total) by mouth daily at 6 PM.  Dispense: 90 tablet; Refill: 3  2. Chronic diastolic CHF (congestive heart failure) (HCC) It has been a little over a year since patient has had metabolic panel and kidney function assessed.  CMP ordered today. Carvedilol refilled. Patient provided with education on heart healthy diet. Follow-up with Dr. Sheppard Coil sooner if needed.  Patient reports appointment is scheduled for May. - Comprehensive metabolic panel - carvedilol (COREG) 3.125 MG tablet; Take 1 tablet (3.125 mg total) by mouth 2 (two) times daily with a meal.  Dispense: 180 tablet; Refill: 3   Return in 6 months (on 12/25/2019) for HLD, CHF, CAD.  Appt scheduled for May with Dr. Roxan Hockey, NP

## 2019-06-28 LAB — LIPID PANEL
Cholesterol: 172 mg/dL (ref <200)
HDL: 47 mg/dL (ref >=40)
LDL Cholesterol (Calc): 101 mg/dL (calc) — ABNORMAL HIGH
Non-HDL Cholesterol (Calc): 125 mg/dL (calc) (ref <130)
Total CHOL/HDL Ratio: 3.7 (calc) (ref <5.0)
Triglycerides: 140 mg/dL (ref <150)

## 2019-06-28 LAB — COMPLETE METABOLIC PANEL WITH GFR
AG Ratio: 1.7 (calc) (ref 1.0–2.5)
ALT: 18 U/L (ref 9–46)
AST: 16 U/L (ref 10–35)
Albumin: 4.5 g/dL (ref 3.6–5.1)
Alkaline phosphatase (APISO): 79 U/L (ref 35–144)
BUN/Creatinine Ratio: 16 (calc) (ref 6–22)
BUN: 20 mg/dL (ref 7–25)
CO2: 26 mmol/L (ref 20–32)
Calcium: 9.1 mg/dL (ref 8.6–10.3)
Chloride: 108 mmol/L (ref 98–110)
Creat: 1.25 mg/dL — ABNORMAL HIGH (ref 0.70–1.18)
GFR, Est African American: 64 mL/min/{1.73_m2} (ref >=60)
GFR, Est Non African American: 56 mL/min/{1.73_m2} — ABNORMAL LOW (ref >=60)
Globulin: 2.7 g/dL (calc) (ref 1.9–3.7)
Glucose, Bld: 104 mg/dL — ABNORMAL HIGH (ref 65–99)
Potassium: 4.2 mmol/L (ref 3.5–5.3)
Sodium: 143 mmol/L (ref 135–146)
Total Bilirubin: 0.6 mg/dL (ref 0.2–1.2)
Total Protein: 7.2 g/dL (ref 6.1–8.1)

## 2019-09-16 ENCOUNTER — Other Ambulatory Visit: Payer: Self-pay | Admitting: Cardiovascular Disease

## 2019-10-11 ENCOUNTER — Other Ambulatory Visit: Payer: Self-pay

## 2019-10-11 ENCOUNTER — Ambulatory Visit (INDEPENDENT_AMBULATORY_CARE_PROVIDER_SITE_OTHER): Payer: Federal, State, Local not specified - PPO | Admitting: Osteopathic Medicine

## 2019-10-11 ENCOUNTER — Encounter: Payer: Self-pay | Admitting: Osteopathic Medicine

## 2019-10-11 VITALS — BP 120/70 | HR 61 | Temp 97.7°F | Wt 200.0 lb

## 2019-10-11 DIAGNOSIS — Z951 Presence of aortocoronary bypass graft: Secondary | ICD-10-CM | POA: Diagnosis not present

## 2019-10-11 DIAGNOSIS — I2510 Atherosclerotic heart disease of native coronary artery without angina pectoris: Secondary | ICD-10-CM

## 2019-10-11 DIAGNOSIS — N183 Chronic kidney disease, stage 3 unspecified: Secondary | ICD-10-CM

## 2019-10-11 DIAGNOSIS — E785 Hyperlipidemia, unspecified: Secondary | ICD-10-CM

## 2019-10-11 DIAGNOSIS — I130 Hypertensive heart and chronic kidney disease with heart failure and stage 1 through stage 4 chronic kidney disease, or unspecified chronic kidney disease: Secondary | ICD-10-CM

## 2019-10-11 DIAGNOSIS — I5032 Chronic diastolic (congestive) heart failure: Secondary | ICD-10-CM | POA: Diagnosis not present

## 2019-10-11 DIAGNOSIS — N182 Chronic kidney disease, stage 2 (mild): Secondary | ICD-10-CM

## 2019-10-11 MED ORDER — CLOPIDOGREL BISULFATE 75 MG PO TABS: 75.0000 mg | ORAL_TABLET | Freq: Every day | ORAL | 0 refills | Status: DC

## 2019-10-11 MED ORDER — LISINOPRIL 2.5 MG PO TABS: 2.5000 mg | ORAL_TABLET | Freq: Every day | ORAL | 0 refills | Status: DC

## 2019-10-11 NOTE — Progress Notes (Signed)
David [name] is a 79 y.o. male who presents to  [place] [facility]  today, 10/11/19, seeking care for the following: . BP follow up  . Labs reviewed form 06/27/19: LDL 101, Glc 104, Cr 1.25 stable  . Requesting note to excuse him from seat belt use      ASSESSMENT & PLAN with other pertinent history/findings:  The primary encounter diagnosis was Hyperlipidemia, unspecified hyperlipidemia type. 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  Refills sent Declines all vaccines I am NOT going to write a note to excuse from seat belt use Educated on proper seatbelt use, and if there is an issue with the belt feeling too tight ne needs to take this to his Dealer.   BP Readings from Last 3 Encounters:  10/11/19 120/70  06/27/19 130/76  04/13/19 121/68     General Preventive Care  Most recent routine screening labs: no concerns   Tobacco: don't!   Alcohol: responsible moderation is ok for most adults - if you have concerns about your alcohol intake, please talk to me!   Exercise: as tolerated   Mental health: if need for mental health care (medicines, counseling, other), or concerns about moods, please let me know!   Sexual health: if need for STD testing, or if concerns with libido/pain problems, please let me know!   Advanced Directive: Living Will and/or Healthcare Power of Attorney recommended for all adults, regardless of age or health.  Vaccines  Flu vaccine: for almost everyone, every fall.   Shingles vaccine: after age 63.   Pneumonia vaccines: after age 63  Tetanus booster: every 10 years / if  significant wounds  COVID vaccine: STRONGLY RECOMMENDED Cancer screenings   Colon cancer screening: aged out  Prostate cancer screening: aged out  Lung cancer screening: not needed since quit smoking  Infection screenings  . HIV: aged out . Gonorrhea/Chlamydia: screening as needed . Hepatitis C: aged out . TB: certain at-risk populations Other . Bone Density Test: recommended . Abdominal Aortic Aneurysm: aged out        Patient Instructions  You are caught up on preventive care and labs   You are DUE for the following immunizations:  COVID vaccine - STRONGLY RECOMMENDED  Pneumonia vaccine - can be done here   Shingles vaccine & Tetanus booster - contact pharmacy   You are due for follow-up with your cardiologist!  Please call them to schedule an appointment.      No orders of the defined types were placed in this encounter.   Meds ordered this encounter  Medications  . clopidogrel (PLAVIX) 75 MG tablet    Sig: Take 1 tablet (75 mg total) by mouth daily.    Dispense:  90 tablet    Refill:  0  . lisinopril (ZESTRIL) 2.5 MG tablet    Sig: Take 1 tablet (2.5 mg total) by mouth daily. Please make overdue appt with Dr. Burt Knack before anymore refills.    Dispense:  90 tablet    Refill:  0    Please call our office to schedule an overdue appointment with Dr. Burt Knack before anymore refills. [phone]. Thank you 1st attempt  Follow-up instructions: Return in about 6 months (around 04/12/2020) for MEDICARE WELLNESS. 1 YEAR (09/2020) CHECK-UP WITH DR Lyn Hollingshead .                                         BP 120/70 (BP Location: Left Arm, Patient Position: Sitting, Cuff Size: Normal)   Pulse 61   Temp 97.7 F (36.5 C) (Oral)   Wt 200 lb 0.3 oz (90.7 kg)   BMI 27.90 kg/m   Current Meds  Medication Sig  . acetaminophen (TYLENOL) 325 MG tablet Take 650 mg by mouth every 6 (six) hours as needed for mild pain.  Marland Kitchen  aspirin EC 81 MG EC tablet Take 1 tablet (81 mg total) by mouth daily.  Marland Kitchen atorvastatin (LIPITOR) 80 MG tablet Take 1 tablet (80 mg total) by mouth daily at 6 PM.  . carvedilol (COREG) 3.125 MG tablet Take 1 tablet (3.125 mg total) by mouth 2 (two) times daily with a meal.  . clopidogrel (PLAVIX) 75 MG tablet Take 1 tablet (75 mg total) by mouth daily.  Marland Kitchen lisinopril (ZESTRIL) 2.5 MG tablet Take 1 tablet (2.5 mg total) by mouth daily. Please make overdue appt with Dr. Excell Seltzer before anymore refills.  . NONFORMULARY OR COMPOUNDED ITEM Overnight pulse oximetry  . Omega-3 Fatty Acids (FISH OIL) 1000 MG CAPS Take 1,000 mg by mouth daily.  . polyethylene glycol (MIRALAX / GLYCOLAX) 17 g packet Take 17 g by mouth daily as needed.  . potassium chloride (K-DUR) 10 MEQ tablet Take 1 tab every Mon, Wed, Fri  . torsemide (DEMADEX) 10 MG tablet Take 1 tab every Mon, Wed, Fri  . [DISCONTINUED] clopidogrel (PLAVIX) 75 MG tablet TAKE 1 TABLET BY MOUTH EVERY DAY  . [DISCONTINUED] lisinopril (ZESTRIL) 2.5 MG tablet Take 1 tablet (2.5 mg total) by mouth daily. Please make overdue appt with Dr. Excell Seltzer before anymore refills. 1st attempt    No results found for this or any previous visit (from the past 72 hour(s)).  No results found.  Depression screen [facility] 2/9 10/11/2019 11/11/2018 04/13/2018  Decreased Interest 0 0 0  Down, Depressed, Hopeless 0 0 0  PHQ - 2 Score 0 0 0  Altered sleeping 0 - -  Tired, decreased energy 1 - -  Change in appetite 0 - -  Feeling bad or failure about yourself  0 - -  Trouble concentrating 0 - -  Moving slowly or fidgety/restless 0 - -  Suicidal thoughts 0 - -  PHQ-9 Score 1 - -    GAD 7 : Generalized Anxiety Score 10/11/2019  Nervous, Anxious, on Edge 0  Control/stop worrying 0  Worry too much - different things 0  Trouble relaxing 0  Restless 0  Easily annoyed or irritable 0  Afraid - awful might happen 0  Total GAD 7 Score 0  Anxiety Difficulty Not difficult at all       All questions at time of visit were answered - patient instructed to contact office with any additional concerns or updates.  ER/RTC precautions were reviewed with the patient.  Please note: voice recognition software was used to produce this document, and typos may escape review. Please contact Dr. Lyn Hollingshead for any needed clarifications.

## 2019-10-11 NOTE — Patient Instructions (Addendum)
You are caught up on preventive care and labs   You are DUE for the following immunizations:  COVID vaccine - STRONGLY RECOMMENDED  Pneumonia vaccine - can be done here   Shingles vaccine & Tetanus booster - contact pharmacy   You are due for follow-up with your cardiologist!  Please call them to schedule an appointment.

## 2019-10-18 ENCOUNTER — Other Ambulatory Visit: Payer: Self-pay | Admitting: Cardiovascular Disease

## 2019-10-19 ENCOUNTER — Other Ambulatory Visit: Payer: Self-pay | Admitting: Osteopathic Medicine

## 2020-02-06 NOTE — Progress Notes (Signed)
Cardiology Office Note:    Date:  02/07/2020   ID:  [name], DOB 1941-01-30, MRN [id number]  PCP:  Patient, No Pcp Per  [facility] Cardiologist:  Sherren Mocha, MD  [place] Electrophysiologist:  None   Referring MD: Emeterio Reeve, DO   Chief Complaint:  Follow-up (CAD)    Patient Profile:    David [name] is a 79 y.o. male with:   Coronary artery disease   S/p inf STEMI in 10/19 tx with POBA of RCA then emergent CABG  Post op AFib managed w/ Amiodarone  Diastolic CHF  Hyperlipidemia   Prior CV studies: Echo 03/31/18 Mild LVH, inf AK, EF 50, Gr 2 DD, Asc aorta 39 mm (mildly dilated), mild MAC, trivial MR, mild LAE, mild dilated RV, normal RVSF, mild RAE, trivial effusion  Echo 03/23/18 Mod conc LVH, EF 50-55, inf AK, normal diastolic function, Asc aorta 40 mm, mild RV dilation  Cardiac Catheterization10/28/19 Conclusions: 1. Severe three-vessel CAD, as detailed below. Culprit lesion is acute plaque rupture and thrombotic subtotal occlusion of the mid RCA with TIMI-1 flow.. 2. Moderately elevated left ventricular filling pressure with LVEF of 35 to 45%. There is inferior akinesis. 3. Successful PTCA of the mid RCA using a 2.5 x 15 mm balloon with reduction of stenosis from 99% to 30% and restoration of TIMI-3 flow.    History of Present Illness:    Mr. Dagher was last seen in 07/2018.  He returns for follow up.  He is here alone.  Unfortunately, his medicines were not refilled and he has been out of carvedilol, lisinopril and atorvastatin for the past couple of months.  He has noticed palpitations, especially at night.  These are short-lived.  He has also noticed some burning in his chest at night.  He thought it was probably gas.  However, the quality of the discomfort is similar to the quality of his myocardial infarction.  However, his myocardial infarction symptoms were much worse and associated with diaphoresis and arm pain.   Currently, he has not had any jaw pain, arm pain, diaphoresis.  He does get short of breath with some activities.  This is chronic and stable without significant change.  He has not had exertional chest discomfort.  He has not had syncope, orthopnea, significant leg swelling.      Past Medical History:  Diagnosis Date  . CAD (coronary artery disease) 03/22/2018   S/p Inf STEMI 10/19 >> Tx with POBA to RCA then emergent CABG (LIMA to LAD, Free RIMA to OM, and SVG to PDA) with Dr. Roxy Manns  . Chronic diastolic CHF (congestive heart failure) ([place]) 04/08/2018   Post CABG volume overload req'd Lasix gtt // complicated by AKI // Echo 03/31/18:  Mild LVH, inf AK, EF 50, Gr 2 DD, Asc aorta 39 mm (mildly dilated), mild MAC, trivial MR, mild LAE, mild dilated RV, normal RVSF, mild RAE, trivial effusion // Echo 03/23/18: Mod conc LVH, EF 50-55, inf AK, normal diastolic function, Asc aorta 40 mm, mild RV dilation   . Hyperlipidemia 04/08/2018  . Postoperative atrial fibrillation (HCC)    Tx with Amiodarone    Current Medications: Current Meds  Medication Sig  . acetaminophen (TYLENOL) 325 MG tablet Take 650 mg by mouth every 6 (six) hours as needed for mild pain.  Marland Kitchen aspirin EC 81 MG EC tablet Take 1 tablet (81 mg total) by mouth daily.  . carvedilol (COREG) 3.125 MG tablet Take 1 tablet (3.125 mg total) by mouth 2 (  two) times daily with a meal.  . lisinopril (ZESTRIL) 2.5 MG tablet Take 1 tablet (2.5 mg total) by mouth daily.  . NONFORMULARY OR COMPOUNDED ITEM Overnight pulse oximetry  . Omega-3 Fatty Acids (FISH OIL) 1000 MG CAPS Take 1,000 mg by mouth daily.  . polyethylene glycol (MIRALAX / GLYCOLAX) 17 g packet Take 17 g by mouth daily as needed.  Derrill Memo ON 02/08/2020] potassium chloride (KLOR-CON) 10 MEQ tablet Take 1 tablet (10 mEq total) by mouth 3 (three) times a week. Take 1 tab every Mon, Wed, Fri  . [START ON 02/08/2020] torsemide (DEMADEX) 10 MG tablet Take 1 tablet (10 mg total) by mouth 3  (three) times a week. Take 1 tab every Mon, Wed, Fri  . [DISCONTINUED] atorvastatin (LIPITOR) 80 MG tablet Take 1 tablet (80 mg total) by mouth daily at 6 PM.  . [DISCONTINUED] carvedilol (COREG) 3.125 MG tablet Take 1 tablet (3.125 mg total) by mouth 2 (two) times daily with a meal.  . [DISCONTINUED] clopidogrel (PLAVIX) 75 MG tablet Take 1 tablet (75 mg total) by mouth daily.  . [DISCONTINUED] lisinopril (ZESTRIL) 2.5 MG tablet TAKE 1 TABLET EVERY DAY  . [DISCONTINUED] potassium chloride (K-DUR) 10 MEQ tablet Take 1 tab every Mon, Wed, Fri  . [DISCONTINUED] potassium chloride (KLOR-CON) 10 MEQ tablet Take 1 tablet (10 mEq total) by mouth 3 (three) times a week. Take 1 tab every Mon, Wed, Fri  . [DISCONTINUED] torsemide (DEMADEX) 10 MG tablet Take 1 tab every Mon, Wed, Fri     Allergies:   Patient has no known allergies.   Social History   Tobacco Use  . Smoking status: Former Smoker    Types: Cigarettes    Quit date: 07/03/1977    Years since quitting: 42.6  . Smokeless tobacco: Never Used  Vaping Use  . Vaping Use: Never used  Substance Use Topics  . Alcohol use: Never  . Drug use: Never     Family Hx: The patient's family history includes Rheum arthritis in his brother and mother.  Review of Systems  Constitutional: Negative for chills and fever.  Respiratory: Negative for cough.   Gastrointestinal: Negative for hematochezia and melena.     EKGs/Labs/Other Test Reviewed:    EKG:  EKG is  ordered today.  The ekg ordered today demonstrates normal sinus rhythm, heart 72, normal axis, inferior Q waves, nonspecific ST-T wave changes, QTC 411, no change from prior tracing  Recent Labs: 06/27/2019: ALT 18; BUN 20; Creat 1.25; Potassium 4.2; Sodium 143   Recent Lipid Panel Lab Results  Component Value Date/Time   CHOL 172 06/27/2019 01:45 PM   CHOL 146 05/14/2018 10:09 AM   TRIG 140 06/27/2019 01:45 PM   HDL 47 06/27/2019 01:45 PM   HDL 48 05/14/2018 10:09 AM   CHOLHDL 3.7  06/27/2019 01:45 PM   LDLCALC 101 (H) 06/27/2019 01:45 PM    Physical Exam:    VS:  BP 134/82   Pulse 72   Ht _0  (1.803 m)   Wt 201 lb 3.2 oz (91.3 kg)   SpO2 96%   BMI 28.06 kg/m     Wt Readings from Last 3 Encounters:  02/07/20 201 lb 3.2 oz (91.3 kg)  10/11/19 200 lb 0.3 oz (90.7 kg)  06/27/19 200 lb 3.2 oz (90.8 kg)     Constitutional:      Appearance: Healthy appearance. Not in distress.  Neck:     Thyroid: No thyromegaly.     Vascular:  JVD normal.  Pulmonary:     Effort: Pulmonary effort is normal.     Breath sounds: No wheezing. No rales.  Cardiovascular:     Normal rate. Regular rhythm. Normal S1. Normal S2.     Murmurs: There is no murmur.  Edema:    Peripheral edema absent.  Abdominal:     Palpations: Abdomen is soft.  Skin:    General: Skin is warm and dry.  Neurological:     General: No focal deficit present.     Mental Status: Alert and oriented to person, place and time.     Cranial Nerves: Cranial nerves are intact.      ASSESSMENT & PLAN:    1. Coronary artery disease involving native coronary artery of native heart without angina pectoris 2. Precordial pain S/p inf MI tx with POBA to RCA and subsequent CABG in 02/2018.  He has been out of most of his medications for the past couple of months.  He has noticed some burning in his chest at night.  This is somewhat similar to his previous angina.  However, his electrocardiogram does not demonstrate any changes.  Also, he has not had exertional symptoms.  I will resume his carvedilol and lisinopril.  I will also obtain a Lexiscan Myoview.  If his symptoms are resolved/improved on medical therapy and his Myoview was low risk, he will not need further work-up.  He is more than a year out from his myocardial infarction.  He can stop clopidogrel.  Continue aspirin, statin, beta-blocker, ACE inhibitor.  I will see him back in 4 weeks.  3. Chronic diastolic CHF (congestive heart failure) (HCC) Overall,  volume status appears stable.  Continue current dose of torsemide.  Most recent creatinine stable.  4. Mixed hyperlipidemia Recent LDL 101.  Goal LDL is <70.  Discontinue atorvastatin.  Start rosuvastatin 40 mg daily.  Obtain CMET, lipids and 8 weeks.  5. Palpitations These have started since he stopped taking his beta-blocker.  Resume beta-blocker.  If he still has palpitations at follow-up, consider monitoring.  Dispo:  Return in about 4 weeks (around 03/06/2020) for Follow up after testing, w/ Richardson Dopp, PA-C, in person.   Medication Adjustments/Labs and Tests Ordered: Current medicines are reviewed at length with the patient today.  Concerns regarding medicines are outlined above.  Tests Ordered: Orders Placed This Encounter  Procedures  . Comprehensive metabolic panel  . Lipid panel  . MYOCARDIAL PERFUSION IMAGING  . EKG 12-Lead   Medication Changes: Meds ordered this encounter  Medications  . carvedilol (COREG) 3.125 MG tablet    Sig: Take 1 tablet (3.125 mg total) by mouth 2 (two) times daily with a meal.    Dispense:  180 tablet    Refill:  3  . DISCONTD: potassium chloride (KLOR-CON) 10 MEQ tablet    Sig: Take 1 tablet (10 mEq total) by mouth 3 (three) times a week. Take 1 tab every Mon, Wed, Fri    Dispense:  45 tablet    Refill:  3  . torsemide (DEMADEX) 10 MG tablet    Sig: Take 1 tablet (10 mg total) by mouth 3 (three) times a week. Take 1 tab every Mon, Wed, Fri    Dispense:  45 tablet    Refill:  3  . lisinopril (ZESTRIL) 2.5 MG tablet    Sig: Take 1 tablet (2.5 mg total) by mouth daily.    Dispense:  90 tablet    Refill:  3  . rosuvastatin (  CRESTOR) 40 MG tablet    Sig: Take 1 tablet (40 mg total) by mouth daily.    Dispense:  90 tablet    Refill:  3  . potassium chloride (KLOR-CON) 10 MEQ tablet    Sig: Take 1 tablet (10 mEq total) by mouth 3 (three) times a week. Take 1 tab every Mon, Wed, Fri    Dispense:  45 tablet    Refill:  3     Signed, Richardson Dopp, PA-C  02/07/2020 11:05 AM    [facility] [place], [place], [place]  [address] Phone: [phone]; Fax: [phone]

## 2020-02-07 ENCOUNTER — Encounter: Payer: Self-pay | Admitting: Physician Assistant

## 2020-02-07 ENCOUNTER — Other Ambulatory Visit: Payer: Self-pay

## 2020-02-07 ENCOUNTER — Ambulatory Visit: Payer: Federal, State, Local not specified - PPO | Admitting: Physician Assistant

## 2020-02-07 VITALS — BP 134/82 | HR 72 | Ht 71.0 in | Wt 201.2 lb

## 2020-02-07 DIAGNOSIS — I2510 Atherosclerotic heart disease of native coronary artery without angina pectoris: Secondary | ICD-10-CM

## 2020-02-07 DIAGNOSIS — I5032 Chronic diastolic (congestive) heart failure: Secondary | ICD-10-CM | POA: Diagnosis not present

## 2020-02-07 DIAGNOSIS — R072 Precordial pain: Secondary | ICD-10-CM | POA: Diagnosis not present

## 2020-02-07 DIAGNOSIS — E782 Mixed hyperlipidemia: Secondary | ICD-10-CM | POA: Diagnosis not present

## 2020-02-07 DIAGNOSIS — R002 Palpitations: Secondary | ICD-10-CM

## 2020-02-07 MED ORDER — POTASSIUM CHLORIDE ER 10 MEQ PO TBCR: 10.0000 meq | EXTENDED_RELEASE_TABLET | ORAL | 3 refills | Status: DC

## 2020-02-07 MED ORDER — TORSEMIDE 10 MG PO TABS: 10.0000 mg | ORAL_TABLET | ORAL | 3 refills | Status: DC

## 2020-02-07 MED ORDER — CARVEDILOL 3.125 MG PO TABS: 3.1250 mg | ORAL_TABLET | Freq: Two times a day (BID) | ORAL | 3 refills | Status: DC

## 2020-02-07 MED ORDER — ROSUVASTATIN CALCIUM 40 MG PO TABS: 40.0000 mg | ORAL_TABLET | Freq: Every day | ORAL | 3 refills | Status: DC

## 2020-02-07 MED ORDER — LISINOPRIL 2.5 MG PO TABS: 2.5000 mg | ORAL_TABLET | Freq: Every day | ORAL | 3 refills | Status: DC

## 2020-02-07 NOTE — Patient Instructions (Addendum)
Medication Instructions:  Your physician has recommended you make the following change in your medication:   1) Stop Atorvastatin 2) Stop Plavix 3) Start Rosuvastatin 40 mg, 1 tablet by mouth once a day  *If you need a refill on your cardiac medications before your next appointment, please call your pharmacy*  Lab Work: Your physician recommends that you return for lab work in 8 weeks on 04/03/20; come fasting to this lab appointment your cholesterol will be check. **The lab is open from 7:30AM-4:30PM** You may come anytime between these hours.  Testing/Procedures: Your physician has requested that you have a lexiscan myoview. For further information please visit [email or web address] Please follow instruction sheet, as given.  Follow-Up: At [facility], you and your health needs are our priority.  As part of our continuing mission to provide you with exceptional heart care, we have created designated Provider Care Teams.  These Care Teams include your primary Cardiologist (physician) and Advanced Practice Providers (APPs -  Physician Assistants and Nurse Practitioners) who all work together to provide you with the care you need, when you need it.  Your next appointment:   1 month(s)  The format for your next appointment:   In Person  Provider:   Tereso Newcomer, PA-C

## 2020-02-09 ENCOUNTER — Telehealth (HOSPITAL_COMMUNITY): Payer: Self-pay | Admitting: *Deleted

## 2020-02-09 NOTE — Telephone Encounter (Signed)
Patient given detailed instructions per Myocardial Perfusion Study Information Sheet for the test on 02/13/20 at 7:45. Patient notified to arrive 15 minutes early and that it is imperative to arrive on time for appointment to keep from having the test rescheduled.  If you need to cancel or reschedule your appointment, please call the office within 24 hours of your appointment. . Patient verbalized understanding.Daneil Dolin

## 2020-02-13 ENCOUNTER — Other Ambulatory Visit: Payer: Self-pay

## 2020-02-13 ENCOUNTER — Ambulatory Visit (HOSPITAL_COMMUNITY): Payer: Federal, State, Local not specified - PPO | Attending: Cardiology

## 2020-02-13 DIAGNOSIS — R072 Precordial pain: Secondary | ICD-10-CM | POA: Diagnosis not present

## 2020-02-13 LAB — MYOCARDIAL PERFUSION IMAGING
LV dias vol: 215 mL (ref 62–150)
LV sys vol: 143 mL
Peak HR: 103 {beats}/min
Rest HR: 70 {beats}/min
SDS: 6
SRS: 2
SSS: 8
TID: 1.09

## 2020-02-13 MED ORDER — TECHNETIUM TC 99M TETROFOSMIN IV KIT
11.0000 | PACK | Freq: Once | INTRAVENOUS | Status: AC | PRN
  Administered 2020-02-13: 11 via INTRAVENOUS
  Filled 2020-02-13: qty 11

## 2020-02-13 MED ORDER — REGADENOSON 0.4 MG/5ML IV SOLN
0.4000 mg | Freq: Once | INTRAVENOUS | Status: AC
  Administered 2020-02-13: 0.4 mg via INTRAVENOUS

## 2020-02-13 MED ORDER — TECHNETIUM TC 99M TETROFOSMIN IV KIT
31.2000 | PACK | Freq: Once | INTRAVENOUS | Status: AC | PRN
  Administered 2020-02-13: 31.2 via INTRAVENOUS
  Filled 2020-02-13: qty 32

## 2020-02-14 ENCOUNTER — Encounter: Payer: Self-pay | Admitting: Physician Assistant

## 2020-02-16 ENCOUNTER — Telehealth: Payer: Self-pay | Admitting: Physician Assistant

## 2020-02-16 DIAGNOSIS — R079 Chest pain, unspecified: Secondary | ICD-10-CM

## 2020-02-16 DIAGNOSIS — R9439 Abnormal result of other cardiovascular function study: Secondary | ICD-10-CM

## 2020-02-16 NOTE — Telephone Encounter (Signed)
David Lecher, PA-C  02/14/2020 10:06 AM EDT     Please call the patient. The stress test shows evidence of his old heart attack. There is no ischemia. His EF is low on this study. Stress tests often underestimate EF.  PLAN:  - Schedule a 2D-Echocardiogram to assess LVEF (Dx: chest pain, abnormal stress test) - Send Copy to PCP David Newcomer, PA-C   02/14/2020 10:02 AM    Patient notified.  He would like to proceed with echo.  Order placed.  Patient aware he will be called with appointment information.  He does not have a PCP

## 2020-02-16 NOTE — Telephone Encounter (Signed)
Pt called for the results of he MYOCARD 02/13/20 please give him a call back when ready.

## 2020-02-29 ENCOUNTER — Other Ambulatory Visit: Payer: Self-pay

## 2020-02-29 ENCOUNTER — Ambulatory Visit (HOSPITAL_COMMUNITY): Payer: Federal, State, Local not specified - PPO | Attending: Cardiovascular Disease

## 2020-02-29 DIAGNOSIS — R9439 Abnormal result of other cardiovascular function study: Secondary | ICD-10-CM | POA: Diagnosis not present

## 2020-02-29 DIAGNOSIS — R079 Chest pain, unspecified: Secondary | ICD-10-CM | POA: Diagnosis not present

## 2020-02-29 LAB — ECHOCARDIOGRAM COMPLETE
Area-P 1/2: 2.87 cm2
MV M vel: 4.99 m/s
MV Peak grad: 99.6 mmHg
S' Lateral: 4.6 cm

## 2020-02-29 MED ORDER — PERFLUTREN LIPID MICROSPHERE
1.0000 mL | INTRAVENOUS | Status: AC | PRN
  Administered 2020-02-29: 2 mL via INTRAVENOUS

## 2020-03-01 ENCOUNTER — Telehealth: Payer: Self-pay | Admitting: *Deleted

## 2020-03-01 ENCOUNTER — Encounter: Payer: Self-pay | Admitting: Physician Assistant

## 2020-03-01 DIAGNOSIS — I5032 Chronic diastolic (congestive) heart failure: Secondary | ICD-10-CM

## 2020-03-01 MED ORDER — SACUBITRIL-VALSARTAN 24-26 MG PO TABS: 1.0000 | ORAL_TABLET | Freq: Two times a day (BID) | ORAL | 3 refills | Status: DC

## 2020-03-01 NOTE — Telephone Encounter (Signed)
Pt has been notified of echo results and recommendations by phone with verbal understanding. Pt is agreeable to plan of care, d/c lisinopril, start Entresto 24/26 mg BID 36 hours after d/c of Lisinopril, bmet to be done when 10/13 at ov. Rx for Sherryll Burger has been sent in. Pt states he took Lisinopril at 8 am today. I advised do not take any more Lisinopril, wait 36 hours from 8 am this morning before starting the Entresto. Pt did verbalize understanding to plan of care. Patient notified of result. Please refer to phone note from today for complete details.  Danielle Rankin, [facility] 03/01/2020 12:28 PM

## 2020-03-01 NOTE — Telephone Encounter (Signed)
-----   Message from Beatrice Lecher, [place] sent at 03/01/2020  8:32 AM EDT ----- Please call the patient. His ejection fraction is now much lower (25-30%).  It was previously normal. We will need to discuss further evaluation at his appointment with me next week (i.e. cardiac catheterization).  I would like to adjust his medications to better manage his reduced EF. PLAN:  -DC lisinopril -36 hours AFTER the last dose of Lisinopril, start Entresto 24/26 mg twice daily -Obtain BMET at follow-up appointment with me next week -Keep appt with me next week to review echocardiogram and discuss Tereso Newcomer, PA-C    03/01/2020 7:53 AM

## 2020-03-05 ENCOUNTER — Telehealth: Payer: Self-pay

## 2020-03-05 NOTE — Telephone Encounter (Signed)
**Note De-Identified  Obfuscation** I have started a Entresto PA through covermymeds: Key: [id number]

## 2020-03-06 NOTE — H&P (View-Only) (Signed)
Cardiology Office Note:    Date:  03/07/2020   ID:  David [name], DOB [birth year], MRN [id number]  PCP:  Patient, No Pcp Per  [place] Cardiologist:  Sherren Mocha, MD   [place] Electrophysiologist:  None   Referring MD: No ref. provider found   Chief Complaint:  Follow-up (cardiomyopathy)    Patient Profile:    David [name] is a 79 y.o. male with:   Coronary artery disease  ? S/p inf STEMI in 10/19 tx with POBA of RCA then emergent CABG  Post op AFib managed w/ Amiodarone  Diastolic CHF  Hyperlipidemia   Prior CV studies: Echocardiogram 02/29/20 Inf-sept AK, no LV thrombus, EF 25-30, Gr 1 DD, mildly reduced RVSF, RVSP 20.3, mild to mod MR, AV sclerosis (no AS), mild dilation of aortic root (42 mm)  Myoview 02/13/20 EF 33, inf-lat and inf AK, inf-lat and apical lat infarct, no peri-infarct ischemia  Echo 03/31/18 Mild LVH, inf AK, EF 50, Gr 2 DD, Asc aorta 39 mm (mildly dilated), mild MAC, trivial MR, mild LAE, mild dilated RV, normal RVSF, mild RAE, trivial effusion  Echo 03/23/18 Mod conc LVH, EF 50-55, inf AK, normal diastolic function, Asc aorta 40 mm, mild RV dilation  Cardiac Catheterization10/28/19 Conclusions: 1. Severe three-vessel CAD, as detailed below. Culprit lesion is acute plaque rupture and thrombotic subtotal occlusion of the mid RCA with TIMI-1 flow.. 2. Moderately elevated left ventricular filling pressure with LVEF of 35 to 45%. There is inferior akinesis. 3. Successful PTCA of the mid RCA using a 2.5 x 15 mm balloon with reduction of stenosis from 99% to 30% and restoration of TIMI-3 flow.  History of Present Illness:    David [name] was last seen in 9/21.  He had some chest discomfort and I set him up for a Myoview which showed inferior scar but no ischemia.  His EF was 33 and a follow up echocardiogram confirmed his EF was reduced at 25-30.  I changed his ACEi to ARNI.  He returns for follow up to discuss possible  cardiac catheterization.  He is here alone.  Since last seen, he has not had further chest discomfort.  He is fairly active.  He has not really noticed significant shortness of breath.  He has not had orthopnea, leg swelling or syncope.      Past Medical History:  Diagnosis Date  . CAD (coronary artery disease) 03/22/2018   S/p Inf STEMI 10/19 >> Tx with POBA to RCA then emergent CABG (LIMA to LAD, Free RIMA to OM, and SVG to PDA) with Dr. Roxy Manns // Myoview 9/21: EF 33, inferolateral and apical lateral infarct, no ischemia; high risk   . Chronic combined systolic & diastolic CHF 24/40/1027   Post CABG volume overload req'd Lasix gtt // complicated by AKI // Echo 03/31/18:  Mild LVH, inf AK, EF 50, Gr 2 DD, Asc aorta 39 mm (mildly dilated), mild MAC, trivial MR, mild LAE, mild dilated RV, normal RVSF, mild RAE, trivial effusion // Echo 03/23/18: Mod conc LVH, EF 50-55, inf AK, normal diastolic function, Asc aorta 40 mm, mild RV dilation // Echo 10/21: EF 25-30, inferoseptal AK, GR 1 DD  . Echocardiogram 02/2020    Echo 10/21: EF 25-30, inferoseptal AK, GR 1 DD, mildly reduced RVSF, RVSP 20.3 mmHg, mild-moderate MR, moderate AV calcification with mild aortic valve sclerosis (no AS), dilated aortic root (42 mm)  . Hyperlipidemia 04/08/2018  . Postoperative atrial fibrillation (HCC)    Tx with Amiodarone  Current Medications: Current Meds  Medication Sig  . acetaminophen (TYLENOL) 325 MG tablet Take 650 mg by mouth every 6 (six) hours as needed for mild pain.  Marland Kitchen aspirin EC 81 MG EC tablet Take 1 tablet (81 mg total) by mouth daily.  . carvedilol (COREG) 3.125 MG tablet Take 1 tablet (3.125 mg total) by mouth 2 (two) times daily with a meal.  . NONFORMULARY OR COMPOUNDED ITEM Overnight pulse oximetry  . Omega-3 Fatty Acids (FISH OIL) 1000 MG CAPS Take 1,000 mg by mouth daily.  . polyethylene glycol (MIRALAX / GLYCOLAX) 17 g packet Take 17 g by mouth daily as needed.  . potassium chloride (KLOR-CON)  10 MEQ tablet Take 1 tablet (10 mEq total) by mouth 3 (three) times a week. Take 1 tab every Mon, Wed, Fri  . rosuvastatin (CRESTOR) 40 MG tablet Take 1 tablet (40 mg total) by mouth daily.  . sacubitril-valsartan (ENTRESTO) 24-26 MG Take 1 tablet by mouth 2 (two) times daily.  Marland Kitchen torsemide (DEMADEX) 10 MG tablet Take 1 tablet (10 mg total) by mouth 3 (three) times a week. Take 1 tab every Mon, Wed, Fri     Allergies:   Patient has no known allergies.   Social History   Tobacco Use  . Smoking status: Former Smoker    Types: Cigarettes    Quit date: 07/03/1977    Years since quitting: 42.7  . Smokeless tobacco: Never Used  Vaping Use  . Vaping Use: Never used  Substance Use Topics  . Alcohol use: Never  . Drug use: Never     Family Hx: The patient's family history includes Rheum arthritis in his brother and mother.  Review of Systems  Constitutional: Negative for chills and fever.  Respiratory: Negative for cough.   Gastrointestinal: Negative for diarrhea, hematochezia, melena and vomiting.  Genitourinary: Negative for hematuria.  All other systems reviewed and are negative.    EKGs/Labs/Other Test Reviewed:    EKG:  EKG is  ordered today.  The ekg ordered today demonstrates normal sinus rhythm, heart rate 66, normal axis, inferior Q waves, nonspecific ST-T wave changes, QTC 438  Recent Labs: 06/27/2019: ALT 18; BUN 20; Creat 1.25; Potassium 4.2; Sodium 143   Recent Lipid Panel Lab Results  Component Value Date/Time   CHOL 172 06/27/2019 01:45 PM   CHOL 146 05/14/2018 10:09 AM   TRIG 140 06/27/2019 01:45 PM   HDL 47 06/27/2019 01:45 PM   HDL 48 05/14/2018 10:09 AM   CHOLHDL 3.7 06/27/2019 01:45 PM   LDLCALC 101 (H) 06/27/2019 01:45 PM    Physical Exam:    VS:  BP 128/70   Pulse 68   Ht _0  (1.803 m)   Wt 195 lb 3.2 oz (88.5 kg)   SpO2 97%   BMI 27.22 kg/m     Wt Readings from Last 3 Encounters:  03/07/20 195 lb 3.2 oz (88.5 kg)  02/13/20 201 lb (91.2 kg)    02/07/20 201 lb 3.2 oz (91.3 kg)     Constitutional:      Appearance: Healthy appearance. Not in distress.  Neck:     Thyroid: No thyromegaly.     Vascular: JVD normal.  Pulmonary:     Effort: Pulmonary effort is normal.     Breath sounds: No wheezing. No rales.  Cardiovascular:     Normal rate. Regular rhythm. Normal S1. Normal S2.     Murmurs: There is no murmur.  Edema:    Peripheral edema absent.  Abdominal:     Palpations: Abdomen is soft. There is no hepatomegaly.  Skin:    General: Skin is warm and dry.  Neurological:     General: No focal deficit present.     Mental Status: Alert and oriented to person, place and time.     Cranial Nerves: Cranial nerves are intact.      ASSESSMENT & PLAN:    1. Chronic systolic CHF (congestive heart failure) ([place]) 2. Ischemic cardiomyopathy EF at the time of his myocardial infarction was 35-45%.  This improved to normal post bypass surgery.  Recently, he had some anginal symptoms and a nuclear stress test demonstrated inferior scar but no ischemia and an EF of 33%.  Echocardiogram confirmed EF is now 20-25%.  Since last seen, he has not really had significant anginal symptoms.  I had previously reviewed his case with Dr. Burt Knack and we agreed that cardiac catheterization is indicated given the significant drop in his ejection fraction.  Risks and benefits of cardiac catheterization have been discussed with the patient.  These include bleeding, infection, kidney damage, stroke, heart attack, death.  The patient understands these risks and is willing to proceed.  I had changed his lisinopril to Entresto.  However, he tells me that the pharmacy noted that his insurance would not cover it.  We will try to get this approved for him.  If we cannot, we will have to switch him back to lisinopril.    -Cardiac catheterization in the next 2 weeks  -Continue current dose of carvedilol, torsemide, potassium.    -Follow-up 2 weeks after cardiac  catheterization.  3. Coronary artery disease involving native coronary artery of native heart without angina pectoris S/p inf MI tx with POBA to RCA and subsequent CABG in 02/2018.  As noted, his EF is now 20-25.  Cardiac catheterization will be arranged to further evaluate.  Currently, he is not having anginal symptoms.  Continue current dose of aspirin, carvedilol, rosuvastatin.  4. Mixed hyperlipidemia Continue high intensity statin therapy with rosuvastatin.  He has labs pending next month to recheck his lipids.     Dispo:  Return in about 2 weeks (around 03/21/2020) for Post Procedure Follow Up, w/ Richardson Dopp, PA-C, in person.   Medication Adjustments/Labs and Tests Ordered: Current medicines are reviewed at length with the patient today.  Concerns regarding medicines are outlined above.  Tests Ordered: Orders Placed This Encounter  Procedures  . CBC  . Basic metabolic panel  . EKG 12-Lead   Medication Changes: No orders of the defined types were placed in this encounter.   Signed, Richardson Dopp, PA-C  03/07/2020 12:41 PM    [facility] [address], [place], [place]  [address] Phone: [phone]; Fax: [phone]

## 2020-03-06 NOTE — Progress Notes (Signed)
Cardiology Office Note:    Date:  03/07/2020   ID:  David [name], DOB 07/26/1940, MRN [id number]  PCP:  Patient, No Pcp Per  [facility] Cardiologist:  Michael Cooper, MD   [facility] Electrophysiologist:  None   Referring MD: No ref. provider found   Chief Complaint:  Follow-up (cardiomyopathy)    Patient Profile:    David [name] is a 79 y.o. male with:   Coronary artery disease  ? S/p inf STEMI in 10/19 tx with POBA of RCA then emergent CABG  Post op AFib managed w/ Amiodarone  Diastolic CHF  Hyperlipidemia   Prior CV studies: Echocardiogram 02/29/20 Inf-sept AK, no LV thrombus, EF 25-30, Gr 1 DD, mildly reduced RVSF, RVSP 20.3, mild to mod MR, AV sclerosis (no AS), mild dilation of aortic root (42 mm)  Myoview 02/13/20 EF 33, inf-lat and inf AK, inf-lat and apical lat infarct, no peri-infarct ischemia  Echo 03/31/18 Mild LVH, inf AK, EF 50, Gr 2 DD, Asc aorta 39 mm (mildly dilated), mild MAC, trivial MR, mild LAE, mild dilated RV, normal RVSF, mild RAE, trivial effusion  Echo 03/23/18 Mod conc LVH, EF 50-55, inf AK, normal diastolic function, Asc aorta 40 mm, mild RV dilation  Cardiac Catheterization10/28/19 Conclusions: 1. Severe three-vessel CAD, as detailed below. Culprit lesion is acute plaque rupture and thrombotic subtotal occlusion of the mid RCA with TIMI-1 flow.. 2. Moderately elevated left ventricular filling pressure with LVEF of 35 to 45%. There is inferior akinesis. 3. Successful PTCA of the mid RCA using a 2.5 x 15 mm balloon with reduction of stenosis from 99% to 30% and restoration of TIMI-3 flow.  History of Present Illness:    David [name] was last seen in 9/21.  He had some chest discomfort and I set him up for a Myoview which showed inferior scar but no ischemia.  His EF was 33 and a follow up echocardiogram confirmed his EF was reduced at 25-30.  I changed his ACEi to ARNI.  He returns for follow up to discuss possible  cardiac catheterization.  He is here alone.  Since last seen, he has not had further chest discomfort.  He is fairly active.  He has not really noticed significant shortness of breath.  He has not had orthopnea, leg swelling or syncope.      Past Medical History:  Diagnosis Date  . CAD (coronary artery disease) 03/22/2018   S/p Inf STEMI 10/19 >> Tx with POBA to RCA then emergent CABG (LIMA to LAD, Free RIMA to OM, and SVG to PDA) with Dr. Owen // Myoview 9/21: EF 33, inferolateral and apical lateral infarct, no ischemia; high risk   . Chronic combined systolic & diastolic CHF 04/08/2018   Post CABG volume overload req'd Lasix gtt // complicated by AKI // Echo 03/31/18:  Mild LVH, inf AK, EF 50, Gr 2 DD, Asc aorta 39 mm (mildly dilated), mild MAC, trivial MR, mild LAE, mild dilated RV, normal RVSF, mild RAE, trivial effusion // Echo 03/23/18: Mod conc LVH, EF 50-55, inf AK, normal diastolic function, Asc aorta 40 mm, mild RV dilation // Echo 10/21: EF 25-30, inferoseptal AK, GR 1 DD  . Echocardiogram 02/2020    Echo 10/21: EF 25-30, inferoseptal AK, GR 1 DD, mildly reduced RVSF, RVSP 20.3 mmHg, mild-moderate MR, moderate AV calcification with mild aortic valve sclerosis (no AS), dilated aortic root (42 mm)  . Hyperlipidemia 04/08/2018  . Postoperative atrial fibrillation (HCC)    Tx with Amiodarone      Current Medications: Current Meds  Medication Sig  . acetaminophen (TYLENOL) 325 MG tablet Take 650 mg by mouth every 6 (six) hours as needed for mild pain.  . aspirin EC 81 MG EC tablet Take 1 tablet (81 mg total) by mouth daily.  . carvedilol (COREG) 3.125 MG tablet Take 1 tablet (3.125 mg total) by mouth 2 (two) times daily with a meal.  . NONFORMULARY OR COMPOUNDED ITEM Overnight pulse oximetry  . Omega-3 Fatty Acids (FISH OIL) 1000 MG CAPS Take 1,000 mg by mouth daily.  . polyethylene glycol (MIRALAX / GLYCOLAX) 17 g packet Take 17 g by mouth daily as needed.  . potassium chloride (KLOR-CON)  10 MEQ tablet Take 1 tablet (10 mEq total) by mouth 3 (three) times a week. Take 1 tab every Mon, Wed, Fri  . rosuvastatin (CRESTOR) 40 MG tablet Take 1 tablet (40 mg total) by mouth daily.  . sacubitril-valsartan (ENTRESTO) 24-26 MG Take 1 tablet by mouth 2 (two) times daily.  . torsemide (DEMADEX) 10 MG tablet Take 1 tablet (10 mg total) by mouth 3 (three) times a week. Take 1 tab every Mon, Wed, Fri     Allergies:   Patient has no known allergies.   Social History   Tobacco Use  . Smoking status: Former Smoker    Types: Cigarettes    Quit date: 07/03/1977    Years since quitting: 42.7  . Smokeless tobacco: Never Used  Vaping Use  . Vaping Use: Never used  Substance Use Topics  . Alcohol use: Never  . Drug use: Never     Family Hx: The patient's family history includes Rheum arthritis in his brother and mother.  Review of Systems  Constitutional: Negative for chills and fever.  Respiratory: Negative for cough.   Gastrointestinal: Negative for diarrhea, hematochezia, melena and vomiting.  Genitourinary: Negative for hematuria.  All other systems reviewed and are negative.    EKGs/Labs/Other Test Reviewed:    EKG:  EKG is  ordered today.  The ekg ordered today demonstrates normal sinus rhythm, heart rate 66, normal axis, inferior Q waves, nonspecific ST-T wave changes, QTC 438  Recent Labs: 06/27/2019: ALT 18; BUN 20; Creat 1.25; Potassium 4.2; Sodium 143   Recent Lipid Panel Lab Results  Component Value Date/Time   CHOL 172 06/27/2019 01:45 PM   CHOL 146 05/14/2018 10:09 AM   TRIG 140 06/27/2019 01:45 PM   HDL 47 06/27/2019 01:45 PM   HDL 48 05/14/2018 10:09 AM   CHOLHDL 3.7 06/27/2019 01:45 PM   LDLCALC 101 (H) 06/27/2019 01:45 PM    Physical Exam:    VS:  BP 128/70   Pulse 68   Ht 5' 11" (1.803 m)   Wt 195 lb 3.2 oz (88.5 kg)   SpO2 97%   BMI 27.22 kg/m     Wt Readings from Last 3 Encounters:  03/07/20 195 lb 3.2 oz (88.5 kg)  02/13/20 201 lb (91.2 kg)    02/07/20 201 lb 3.2 oz (91.3 kg)     Constitutional:      Appearance: Healthy appearance. Not in distress.  Neck:     Thyroid: No thyromegaly.     Vascular: JVD normal.  Pulmonary:     Effort: Pulmonary effort is normal.     Breath sounds: No wheezing. No rales.  Cardiovascular:     Normal rate. Regular rhythm. Normal S1. Normal S2.     Murmurs: There is no murmur.  Edema:    Peripheral edema absent.    Abdominal:     Palpations: Abdomen is soft. There is no hepatomegaly.  Skin:    General: Skin is warm and dry.  Neurological:     General: No focal deficit present.     Mental Status: Alert and oriented to person, place and time.     Cranial Nerves: Cranial nerves are intact.      ASSESSMENT & PLAN:    1. Chronic systolic CHF (congestive heart failure) ([place]) 2. Ischemic cardiomyopathy EF at the time of his myocardial infarction was 35-45%.  This improved to normal post bypass surgery.  Recently, he had some anginal symptoms and a nuclear stress test demonstrated inferior scar but no ischemia and an EF of 33%.  Echocardiogram confirmed EF is now 20-25%.  Since last seen, he has not really had significant anginal symptoms.  I had previously reviewed his case with Dr. Burt Knack and we agreed that cardiac catheterization is indicated given the significant drop in his ejection fraction.  Risks and benefits of cardiac catheterization have been discussed with the patient.  These include bleeding, infection, kidney damage, stroke, heart attack, death.  The patient understands these risks and is willing to proceed.  I had changed his lisinopril to Entresto.  However, he tells me that the pharmacy noted that his insurance would not cover it.  We will try to get this approved for him.  If we cannot, we will have to switch him back to lisinopril.    -Cardiac catheterization in the next 2 weeks  -Continue current dose of carvedilol, torsemide, potassium.    -Follow-up 2 weeks after cardiac  catheterization.  3. Coronary artery disease involving native coronary artery of native heart without angina pectoris S/p inf MI tx with POBA to RCA and subsequent CABG in 02/2018.  As noted, his EF is now 20-25.  Cardiac catheterization will be arranged to further evaluate.  Currently, he is not having anginal symptoms.  Continue current dose of aspirin, carvedilol, rosuvastatin.  4. Mixed hyperlipidemia Continue high intensity statin therapy with rosuvastatin.  He has labs pending next month to recheck his lipids.     Dispo:  Return in about 2 weeks (around 03/21/2020) for Post Procedure Follow Up, w/ Richardson Dopp, PA-C, in person.   Medication Adjustments/Labs and Tests Ordered: Current medicines are reviewed at length with the patient today.  Concerns regarding medicines are outlined above.  Tests Ordered: Orders Placed This Encounter  Procedures  . CBC  . Basic metabolic panel  . EKG 12-Lead   Medication Changes: No orders of the defined types were placed in this encounter.   Signed, Richardson Dopp, PA-C  03/07/2020 12:41 PM    [facility] [address], [place], [place]  [address] Phone: [phone]; Fax: [phone]

## 2020-03-07 ENCOUNTER — Other Ambulatory Visit: Payer: Self-pay

## 2020-03-07 ENCOUNTER — Ambulatory Visit: Payer: Federal, State, Local not specified - PPO | Admitting: Physician Assistant

## 2020-03-07 ENCOUNTER — Encounter: Payer: Self-pay | Admitting: Physician Assistant

## 2020-03-07 VITALS — BP 128/70 | HR 68 | Ht 71.0 in | Wt 195.2 lb

## 2020-03-07 DIAGNOSIS — I2510 Atherosclerotic heart disease of native coronary artery without angina pectoris: Secondary | ICD-10-CM

## 2020-03-07 DIAGNOSIS — I255 Ischemic cardiomyopathy: Secondary | ICD-10-CM

## 2020-03-07 DIAGNOSIS — E782 Mixed hyperlipidemia: Secondary | ICD-10-CM

## 2020-03-07 DIAGNOSIS — I5022 Chronic systolic (congestive) heart failure: Secondary | ICD-10-CM | POA: Diagnosis not present

## 2020-03-07 LAB — BASIC METABOLIC PANEL
BUN/Creatinine Ratio: 19 (ref 10–24)
BUN: 29 mg/dL — ABNORMAL HIGH (ref 8–27)
CO2: 22 mmol/L (ref 20–29)
Calcium: 9.7 mg/dL (ref 8.6–10.2)
Chloride: 100 mmol/L (ref 96–106)
Creatinine, Ser: 1.52 mg/dL — ABNORMAL HIGH (ref 0.76–1.27)
GFR calc Af Amer: 50 mL/min/{1.73_m2} — ABNORMAL LOW (ref >59)
GFR calc non Af Amer: 44 mL/min/{1.73_m2} — ABNORMAL LOW (ref >59)
Glucose: 100 mg/dL — ABNORMAL HIGH (ref 65–99)
Potassium: 4.6 mmol/L (ref 3.5–5.2)
Sodium: 137 mmol/L (ref 134–144)

## 2020-03-07 LAB — CBC
Hematocrit: 45.3 % (ref 37.5–51.0)
Hemoglobin: 16.1 g/dL (ref 13.0–17.7)
MCH: 32.7 pg (ref 26.6–33.0)
MCHC: 35.5 g/dL (ref 31.5–35.7)
MCV: 92 fL (ref 79–97)
Platelets: 207 10*3/uL (ref 150–450)
RBC: 4.93 x10E6/uL (ref 4.14–5.80)
RDW: 12.1 % (ref 11.6–15.4)
WBC: 10.2 10*3/uL (ref 3.4–10.8)

## 2020-03-07 NOTE — Telephone Encounter (Signed)
**Note De-identified  Obfuscation** -----  **Note De-Identified  Obfuscation** Message from Beatrice Lecher, [place] sent at 03/07/2020 12:31 PM EDT ----- Regarding: Claiborne Rigg Can you reach out to him and help him with Sherryll Burger? He said that the pharmacy told him his insurance would not cover it.  He has an EF of 20-25%. Thanks! Scott

## 2020-03-07 NOTE — Patient Instructions (Addendum)
Medication Instructions:  Your physician recommends that you continue on your current medications as directed. Please refer to the Current Medication list given to you today.  *If you need a refill on your cardiac medications before your next appointment, please call your pharmacy*  Lab Work: You will have labs drawn today: BMET/CBC  Testing/Procedures: Your physician has requested that you have a cardiac catheterization. Cardiac catheterization is used to diagnose and/or treat various heart conditions. Doctors may recommend this procedure for a number of different reasons. The most common reason is to evaluate chest pain. Chest pain can be a symptom of coronary artery disease (CAD), and cardiac catheterization can show whether plaque is narrowing or blocking your heart's arteries. This procedure is also used to evaluate the valves, as well as measure the blood flow and oxygen levels in different parts of your heart. For further information please visit [email or web address] Please follow instruction sheet, as given.  Your Pre-procedure COVID-19 Testing will be done on 03/12/20 at 1:10PM. After your swab you will be given a mask to wear and instructed to go home and quarantine you are not allowed to have visitors until after your procedure. If you test positive you will be notified and your procedure will be cancelled.   Follow-Up: On 04/04/20 at 8:45AM with Tereso Newcomer, PA-C; come fasting to this appointment we will check your cholesterol

## 2020-03-07 NOTE — Telephone Encounter (Signed)
**Note De-Identified  Obfuscation** Following message received from covermymeds:  David [name] Key: [id number] - PA Case ID: [id number]  Outcome Approved on October 11  Additional information will be provided in the approval communication. (Message 1145)  Drug Entresto 24-26 MG tablets  Form [employer] Electronic PA Form [phone] NCPDP)   I called [facility] to make them aware that the pts Entresto PA was approved. They ran the RX and stated that the cost to the pt with his ins coverage is $818/90 day supply or $280/30 day supply.  I called the pt but could not make him understand that his cost with insurance coverage is $818/90 day supply and $280/30 day supply. After several attempts I asked him to contact his pharmacy and that if he cannot afford the cost to please call Larita Fife back at [employer] [facility] [employer] office at [phone].  I will call the pt back tomorrow to check on him.

## 2020-03-08 ENCOUNTER — Telehealth: Payer: Self-pay

## 2020-03-08 ENCOUNTER — Telehealth: Payer: Self-pay | Admitting: Physician Assistant

## 2020-03-08 NOTE — Telephone Encounter (Signed)
Yes thanks 

## 2020-03-08 NOTE — Telephone Encounter (Addendum)
**Note De-Identified  Obfuscation** No answer on Paula's VM so I left a message letting her know that she can pick up the $10 Entresto co-pay card that we are leaving in the front office for the pt.

## 2020-03-08 NOTE — Telephone Encounter (Signed)
**Note De-Identified  Obfuscation** The pts daughter and DPR called the office stating that the pt is telling her that a nurse called him from his heart doctors office this morning and told him not to take Lisinpril or Entresto due to kidney results from lab work.  I read prior phone note and advised her that the pt was advised to hold his Lisinopril or Entresto if he Is taking either on the day of his Cath (she and I are currently working on getting his Sherryll Burger) and to hold his Torsemide on that day as well. Gunnar Fusi verbalized understanding to instructions given per Tereso Newcomer, PA-c.   Gunnar Fusi and I discussed the pt applying for asst for his Entresto through [employer]  pt asst further and I noticed while we were talking that he is not on either medicaid or Medicare so he should be able to use a $10 co-pay card.  I have asked the nurse working with Lorin Picket today to see if we have any Entresto $10 co-pay cards at the office and if so to place one in the front office for the pts daughter to pick up and to let me know once it is there so I can call Gunnar Fusi to let her know she can pick up.

## 2020-03-08 NOTE — Telephone Encounter (Signed)
Reviewed results with patient who verbalized understanding.   Confirmed with the patient he is NOT taking lisinopril or Entresto.  Instructed him to hold torsemide day of cath. He was grateful for call and agrees with treatment plan.  Med list updated.

## 2020-03-08 NOTE — Telephone Encounter (Signed)
-----   Message from Beatrice Lecher, PA-C sent at 03/08/2020  9:50 AM EDT ----- Hgb normal.  K+ normal.  Creatinine overall stable but somewhat increased.  He was on Lisinopril.  I tried to change him to [facility] but the cost is prohibitive.  I believe he is not taking either one right now.  Please confirm with the patient whether he is taking Lisinopril, Entresto or neither.   PLAN:  - If taking either Lisinopril or Entresto, hold day of cath - Hold Torsemide day of cath [facility], PA-C    03/08/2020 9:37 AM

## 2020-03-08 NOTE — Telephone Encounter (Signed)
**Note De-Identified  Obfuscation** I called the pt but got no answer so I called his daughter and DRP Gunnar Fusi. Gunnar Fusi and I discussed the cost of the pts Entresto and she states that she feels $818/90 day supply or $280/30 day supply is too expensive for the pt to afford.  We discussed pt asst through [employer] and I did advise that if the pt is approved he will receives his Sherryll Burger free of charge for the remainder of this year and that if he is not approved I will discuss with Tereso Newcomer, PA-c concerning switching to a less costing medication.  She is interested in the pt applying for Pt Asst so I gave her [employer] Pt Asst phone number to call with questions concerning their Entresto program and to request that they mail her an application through the mail. I also advised that if she has a PC and printer she can print an application off and follow the instructions on the 1st page.  She verbalized understanding and thanked me for calling her to discuss as she wants to help her Dad as much as she can.

## 2020-03-08 NOTE — Telephone Encounter (Signed)
Thanks Larita Fife If he cannot get approved for assistance, I will switch him back to Lisinopril. Can you let me know if you hear back about his approval/disapproval? [place], PA-C    03/08/2020 11:33 AM

## 2020-03-08 NOTE — Telephone Encounter (Signed)
Based upon renal function, he will need 3-4 hours of pre hydration prior to his cath.  I reviewed this with Dr. Excell Seltzer.   Please arrange  Also, we are getting him set up with Entresto.  He is no longer taking Lisinopril. Since his cath is next Wed, tell him to plan on starting the [facility] after the cath.  I believe he was picking up a $10 co pay card today.  Tereso Newcomer, PA-C    03/08/2020 2:59 PM

## 2020-03-08 NOTE — Telephone Encounter (Signed)
Procedure time has been changed to 11:30 AM, arrival time remains 6:30 AM. Attempted to contact patient to discuss pre-procedure hydration and change in procedure time, received voicemail message that mailbox is full and cannot leave message. Call placed to patient's daughter (DPR), David [name]-discussed pre-procedure hydration and change in procedure time. David [name] states she will let her father know, he is probably in a location that does not get good phone reception.  I did ask David [name] to let her father know to plan to start [facility] after cath 03/14/20.  She will go ahead and pick up $10 co-pay card today or tomorrow.  David [name] states patient will be in location with phone reception next Tuesday and knows I will plan to follow up with him then to review procedure instructions.

## 2020-03-08 NOTE — Telephone Encounter (Signed)
**Note De-Identified  Obfuscation** I sure will. Thanks, [employer]

## 2020-03-12 ENCOUNTER — Other Ambulatory Visit (HOSPITAL_COMMUNITY)
Admission: RE | Admit: 2020-03-12 | Discharge: 2020-03-12 | Disposition: A | Discharge status: Home / Self Care | Payer: Federal, State, Local not specified - PPO | Source: Ambulatory Visit | Attending: Cardiovascular Disease | Admitting: Cardiovascular Disease

## 2020-03-12 DIAGNOSIS — Z01812 Encounter for preprocedural laboratory examination: Secondary | ICD-10-CM | POA: Diagnosis not present

## 2020-03-12 DIAGNOSIS — Z20822 Contact with and (suspected) exposure to covid-19: Secondary | ICD-10-CM | POA: Diagnosis not present

## 2020-03-12 LAB — SARS CORONAVIRUS 2 (TAT 6-24 HRS): SARS Coronavirus 2: NEGATIVE

## 2020-03-13 NOTE — Telephone Encounter (Signed)
Agree ok to proceed. Will take a look after he arrives. thanks

## 2020-03-13 NOTE — Telephone Encounter (Signed)
Pt contacted pre-catheterization scheduled at [facility] for: Wednesday March 14, 2020 11:30 AM Verified arrival time and place: [facility] Main Entrance [facility]) at: 6:30 AM-pre-procedure hydration Discussed pre-procedure hydration with patient.   No solid food after midnight prior to cath, clear liquids until 5 AM day of procedure.  Hold: Torsemide/KCl-AM of procedure  Except hold medications AM meds can be  taken pre-cath with sips of water including: ASA 81 mg   Confirmed patient has responsible adult to drive home post procedure and be with patient first 24 hours after arriving home: yes  You are allowed ONE visitor in the waiting room during the time you are at the hospital for your procedure. Both you and your visitor must wear a mask once you enter the hospital.       COVID-19 Pre-Screening Questions:  . In the past 14 days have you had a new cough, new headache, new nasal congestion, fever (100.4 or greater) unexplained body aches, new sore throat, or sudden loss of taste or sense of smell? no . In the past 14 days have you been around anyone with known Covid 19? no . Have you been vaccinated for COVID-19? No  Reviewed procedure/mask/visitor instructions, COVID-19 questions with patient.  Pt reports he woke up this morning with painful big toe on his  left foot. Pt reports it may be slightly swollen, no discoloration, denies injury to left toe/foot, no sores or abrasions. 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 Pt advised should be okay to proceed with procedure tomorrow, notify our office if symptoms change.

## 2020-03-14 ENCOUNTER — Ambulatory Visit (HOSPITAL_COMMUNITY)
Admission: RE | Admit: 2020-03-14 | Discharge: 2020-03-14 | Disposition: A | Discharge status: Home / Self Care | Payer: Federal, State, Local not specified - PPO | Attending: Cardiovascular Disease | Admitting: Cardiovascular Disease

## 2020-03-14 ENCOUNTER — Ambulatory Visit (HOSPITAL_COMMUNITY)
Admission: RE | Disposition: A | Discharge status: Home / Self Care | Payer: Self-pay | Source: Home / Self Care | Attending: Cardiovascular Disease

## 2020-03-14 ENCOUNTER — Other Ambulatory Visit: Payer: Self-pay

## 2020-03-14 DIAGNOSIS — Z79899 Other long term (current) drug therapy: Secondary | ICD-10-CM | POA: Diagnosis not present

## 2020-03-14 DIAGNOSIS — I252 Old myocardial infarction: Secondary | ICD-10-CM | POA: Insufficient documentation

## 2020-03-14 DIAGNOSIS — Z87891 Personal history of nicotine dependence: Secondary | ICD-10-CM | POA: Diagnosis not present

## 2020-03-14 DIAGNOSIS — Z7982 Long term (current) use of aspirin: Secondary | ICD-10-CM | POA: Diagnosis not present

## 2020-03-14 DIAGNOSIS — I2582 Chronic total occlusion of coronary artery: Secondary | ICD-10-CM | POA: Diagnosis not present

## 2020-03-14 DIAGNOSIS — I2584 Coronary atherosclerosis due to calcified coronary lesion: Secondary | ICD-10-CM | POA: Insufficient documentation

## 2020-03-14 DIAGNOSIS — I4891 Unspecified atrial fibrillation: Secondary | ICD-10-CM | POA: Diagnosis not present

## 2020-03-14 DIAGNOSIS — I5042 Chronic combined systolic (congestive) and diastolic (congestive) heart failure: Secondary | ICD-10-CM | POA: Diagnosis present

## 2020-03-14 DIAGNOSIS — I2510 Atherosclerotic heart disease of native coronary artery without angina pectoris: Secondary | ICD-10-CM | POA: Diagnosis not present

## 2020-03-14 DIAGNOSIS — I255 Ischemic cardiomyopathy: Secondary | ICD-10-CM | POA: Insufficient documentation

## 2020-03-14 DIAGNOSIS — I5022 Chronic systolic (congestive) heart failure: Secondary | ICD-10-CM

## 2020-03-14 DIAGNOSIS — Z951 Presence of aortocoronary bypass graft: Secondary | ICD-10-CM | POA: Insufficient documentation

## 2020-03-14 DIAGNOSIS — E782 Mixed hyperlipidemia: Secondary | ICD-10-CM | POA: Insufficient documentation

## 2020-03-14 HISTORY — PX: LEFT HEART CATH AND CORS/GRAFTS ANGIOGRAPHY: CATH118250

## 2020-03-14 SURGERY — LEFT HEART CATH AND CORS/GRAFTS ANGIOGRAPHY
Anesthesia: LOCAL

## 2020-03-14 MED ORDER — ACETAMINOPHEN 325 MG PO TABS: 650.0000 mg | ORAL_TABLET | ORAL | Status: DC | PRN

## 2020-03-14 MED ORDER — LABETALOL HCL 5 MG/ML IV SOLN: 10.0000 mg | INTRAVENOUS | Status: DC | PRN

## 2020-03-14 MED ORDER — HEPARIN SODIUM (PORCINE) 1000 UNIT/ML IJ SOLN
INTRAMUSCULAR | Status: AC
  Filled 2020-03-14: qty 1

## 2020-03-14 MED ORDER — VERAPAMIL HCL 2.5 MG/ML IV SOLN
INTRAVENOUS | Status: AC
  Filled 2020-03-14: qty 2

## 2020-03-14 MED ORDER — MIDAZOLAM HCL 2 MG/2ML IJ SOLN
INTRAMUSCULAR | Status: DC | PRN
  Administered 2020-03-14 (×2): 1 mg via INTRAVENOUS

## 2020-03-14 MED ORDER — SODIUM CHLORIDE 0.9% FLUSH: 3.0000 mL | Freq: Two times a day (BID) | INTRAVENOUS | Status: DC

## 2020-03-14 MED ORDER — MIDAZOLAM HCL 2 MG/2ML IJ SOLN
INTRAMUSCULAR | Status: AC
  Filled 2020-03-14: qty 2

## 2020-03-14 MED ORDER — ASPIRIN 81 MG PO CHEW: 81.0000 mg | CHEWABLE_TABLET | ORAL | Status: DC

## 2020-03-14 MED ORDER — LIDOCAINE HCL (PF) 1 % IJ SOLN
INTRAMUSCULAR | Status: AC
  Filled 2020-03-14: qty 30

## 2020-03-14 MED ORDER — FENTANYL CITRATE (PF) 100 MCG/2ML IJ SOLN
INTRAMUSCULAR | Status: DC | PRN
  Administered 2020-03-14 (×2): 25 ug via INTRAVENOUS

## 2020-03-14 MED ORDER — SODIUM CHLORIDE 0.9 % WEIGHT BASED INFUSION: 1.0000 mL/kg/h | INTRAVENOUS | Status: DC

## 2020-03-14 MED ORDER — IOHEXOL 350 MG/ML SOLN
INTRAVENOUS | Status: DC | PRN
  Administered 2020-03-14: 55 mL

## 2020-03-14 MED ORDER — SODIUM CHLORIDE 0.9 % IV SOLN: INTRAVENOUS | Status: DC

## 2020-03-14 MED ORDER — HYDRALAZINE HCL 20 MG/ML IJ SOLN: 10.0000 mg | INTRAMUSCULAR | Status: DC | PRN

## 2020-03-14 MED ORDER — HEPARIN (PORCINE) IN NACL 1000-0.9 UT/500ML-% IV SOLN
INTRAVENOUS | Status: AC
  Filled 2020-03-14: qty 1000

## 2020-03-14 MED ORDER — HEPARIN (PORCINE) IN NACL 1000-0.9 UT/500ML-% IV SOLN
INTRAVENOUS | Status: DC | PRN
  Administered 2020-03-14 (×2): 500 mL

## 2020-03-14 MED ORDER — SODIUM CHLORIDE 0.9 % WEIGHT BASED INFUSION
1.0000 mL/kg/h | INTRAVENOUS | Status: DC
  Administered 2020-03-14: 1 mL/kg/h via INTRAVENOUS

## 2020-03-14 MED ORDER — FENTANYL CITRATE (PF) 100 MCG/2ML IJ SOLN
INTRAMUSCULAR | Status: AC
  Filled 2020-03-14: qty 2

## 2020-03-14 MED ORDER — SODIUM CHLORIDE 0.9% FLUSH: 3.0000 mL | INTRAVENOUS | Status: DC | PRN

## 2020-03-14 MED ORDER — LIDOCAINE HCL (PF) 1 % IJ SOLN
INTRAMUSCULAR | Status: DC | PRN
  Administered 2020-03-14: 15 mL
  Administered 2020-03-14: 2 mL

## 2020-03-14 MED ORDER — ONDANSETRON HCL 4 MG/2ML IJ SOLN: 4.0000 mg | Freq: Four times a day (QID) | INTRAMUSCULAR | Status: DC | PRN

## 2020-03-14 MED ORDER — SODIUM CHLORIDE 0.9 % IV SOLN: 250.0000 mL | INTRAVENOUS | Status: DC | PRN

## 2020-03-14 MED ORDER — SODIUM CHLORIDE 0.9 % WEIGHT BASED INFUSION
3.0000 mL/kg/h | INTRAVENOUS | Status: DC
  Administered 2020-03-14: 3 mL/kg/h via INTRAVENOUS

## 2020-03-14 SURGICAL SUPPLY — 14 items
CATH INFINITI 5FR AL1 (CATHETERS) ×2 IMPLANT
CATH INFINITI 5FR MULTPACK ANG (CATHETERS) ×2 IMPLANT
CLOSURE MYNX CONTROL 5F (Vascular Products) ×2 IMPLANT
GLIDESHEATH SLEND SS 6F .021 (SHEATH) ×2 IMPLANT
GUIDEWIRE INQWIRE 1.5J.035X260 (WIRE) ×1 IMPLANT
INQWIRE 1.5J .035X260CM (WIRE) ×2
KIT HEART LEFT (KITS) ×2 IMPLANT
PACK CARDIAC CATHETERIZATION (CUSTOM PROCEDURE TRAY) ×2 IMPLANT
SHEATH PINNACLE 5F 10CM (SHEATH) ×2 IMPLANT
SHEATH PROBE COVER 6X72 (BAG) ×2 IMPLANT
TRANSDUCER W/STOPCOCK (MISCELLANEOUS) ×2 IMPLANT
TUBING ART PRESS 72  MALE/FEM (TUBING) ×1
TUBING ART PRESS 72 MALE/FEM (TUBING) ×1 IMPLANT
TUBING CIL FLEX 10 FLL-RA (TUBING) ×2 IMPLANT

## 2020-03-14 NOTE — Discharge Instructions (Signed)
Angiogram, Care After This sheet gives you information about how to care for yourself after your procedure. Your health care provider may also give you more specific instructions. If you have problems or questions, contact your health care provider. What can I expect after the procedure? After the procedure, it is common to have bruising and tenderness at the catheter insertion area. Follow these instructions at home: Insertion site care  Follow instructions from your health care provider about how to take care of your insertion site. Make sure you: ? Wash your hands with soap and water before you change your bandage (dressing). If soap and water are not available, use hand sanitizer. ? Change your dressing as told by your health care provider. ? Leave stitches (sutures), skin glue, or adhesive strips in place. These skin closures may need to stay in place for 2 weeks or longer. If adhesive strip edges start to loosen and curl up, you may trim the loose edges. Do not remove adhesive strips completely unless your health care provider tells you to do that.  Do not take baths, swim, or use a hot tub until your health care provider approves.  You may shower 24-48 hours after the procedure or as told by your health care provider. ? Gently wash the site with plain soap and water. ? Pat the area dry with a clean towel. ? Do not rub the site. This may cause bleeding.  Do not apply powder or lotion to the site. Keep the site clean and dry.  Check your insertion site every day for signs of infection. Check for: ? Redness, swelling, or pain. ? Fluid or blood. ? Warmth. ? Pus or a bad smell. Activity  Rest as told by your health care provider, usually for 1-2 days.  Do not lift anything that is heavier than 10 lbs. (4.5 kg) or as told by your health care provider.  Do not drive for 24 hours if you were given a medicine to help you relax (sedative).  Do not drive or use heavy machinery while  taking prescription pain medicine. General instructions   Return to your normal activities as told by your health care provider, usually in about a week. Ask your health care provider what activities are safe for you.  If the catheter site starts bleeding, lie flat and put pressure on the site. If the bleeding does not stop, get help right away. This is a medical emergency.  Drink enough fluid to keep your urine clear or pale yellow. This helps flush the contrast dye from your body.  Take over-the-counter and prescription medicines only as told by your health care provider.  Keep all follow-up visits as told by your health care provider. This is important. Contact a health care provider if:  You have a fever or chills.  You have redness, swelling, or pain around your insertion site.  You have fluid or blood coming from your insertion site.  The insertion site feels warm to the touch.  You have pus or a bad smell coming from your insertion site.  You have bruising around the insertion site.  You notice blood collecting in the tissue around the catheter site (hematoma). The hematoma may be painful to the touch. Get help right away if:  You have severe pain at the catheter insertion area.  The catheter insertion area swells very fast.  The catheter insertion area is bleeding, and the bleeding does not stop when you hold steady pressure on the area.    The area near or just beyond the catheter insertion site becomes pale, cool, tingly, or numb. These symptoms may represent a serious problem that is an emergency. Do not wait to see if the symptoms will go away. Get medical help right away. Call your local emergency services (911 in the [place] Do not drive yourself to the hospital. Summary  After the procedure, it is common to have bruising and tenderness at the catheter insertion area.  After the procedure, it is important to rest and drink plenty of fluids.  Do not take baths,  swim, or use a hot tub until your health care provider says it is okay to do so. You may shower 24-48 hours after the procedure or as told by your health care provider.  If the catheter site starts bleeding, lie flat and put pressure on the site. If the bleeding does not stop, get help right away. This is a medical emergency. This information is not intended to replace advice given to you by your health care provider. Make sure you discuss any questions you have with your health care provider. Document Revised: 04/24/2017 Document Reviewed: 04/16/2016 Elsevier Patient Education  2020 [employer]  

## 2020-03-14 NOTE — Interval H&P Note (Signed)
Cath Lab Visit (complete for each Cath Lab visit)  Clinical Evaluation Leading to the Procedure:   ACS: No.  Non-ACS:    Anginal Classification: CCS II  Anti-ischemic medical therapy: Minimal Therapy (1 class of medications)  Non-Invasive Test Results: High-risk stress test findings: cardiac mortality >3%/year  Prior CABG: Previous CABG      History and Physical Interval Note:  03/14/2020 11:15 AM  David [name]  has presented today for surgery, with the diagnosis of CAD.  The various methods of treatment have been discussed with the patient and family. After consideration of risks, benefits and other options for treatment, the patient has consented to  Procedure(s): LEFT HEART CATH AND CORS/GRAFTS ANGIOGRAPHY (N/A) as a surgical intervention.  The patient's history has been reviewed, patient examined, no change in status, stable for surgery.  I have reviewed the patient's chart and labs.  Questions were answered to the patient's satisfaction.     Tonny Bollman

## 2020-03-15 ENCOUNTER — Encounter (HOSPITAL_COMMUNITY): Payer: Self-pay | Admitting: Cardiovascular Disease

## 2020-03-15 MED FILL — Verapamil HCl IV Soln 2.5 MG/ML: INTRAVENOUS | Qty: 2 | Status: AC

## 2020-03-15 MED FILL — Heparin Sodium (Porcine) Inj 1000 Unit/ML: INTRAMUSCULAR | Qty: 10 | Status: AC

## 2020-04-03 ENCOUNTER — Other Ambulatory Visit: Payer: Federal, State, Local not specified - PPO

## 2020-04-03 NOTE — Progress Notes (Signed)
Cardiology Office Note:    Date:  04/04/2020   ID:  David [name], DOB [birth year], MRN [id number]  PCP:  David [name], No Pcp Per  [place] Cardiologist:  Sherren Mocha, MD  [place] Electrophysiologist:  None   Referring MD: No ref. provider found   Chief Complaint:  Hospitalization Follow-up (s/p cardiac catheterization)    David [name] Profile:    David [name] is a 79 y.o. male with:   Coronary artery disease  ? S/p inf STEMI in 10/19 tx with POBA of RCA then emergent CABG  Post op AFib managed w/ Amiodarone  Cath 10/21: 3/3 grafts patent; med Rx   Heart failure with reduced ejection fraction   EF 35-45 at time of MI >> improved to normal  Echocardiogram 10/21: EF 25-30, Gr 1 DD  Chronic kidney disease   Hyperlipidemia   Prior CV studies: Cardiac catheterization 01-Apr-2020 Severe native 3v CAD L-LAD patent Free RIMA-OM patent S-PDA patent  Echocardiogram 02/29/20 Inf-sept AK, no LV thrombus, EF 25-30, Gr 1 DD, mildly reduced RVSF, RVSP 20.3, mild to mod MR, AV sclerosis (no AS), mild dilation of aortic root (42 mm)  Myoview 02/13/20 EF 33, inf-lat and inf AK, inf-lat and apical lat infarct, no peri-infarct ischemia  Echo 03/31/18 Mild LVH, inf AK, EF 50, Gr 2 DD, Asc aorta 39 mm (mildly dilated), mild MAC, trivial MR, mild LAE, mild dilated RV, normal RVSF, mild RAE, trivial effusion  Echo 03/23/18 Mod conc LVH, EF 50-55, inf AK, normal diastolic function, Asc aorta 40 mm, mild RV dilation  Cardiac Catheterization10/28/19 Conclusions: 1. Severe three-vessel CAD, as detailed below. Culprit lesion is acute plaque rupture and thrombotic subtotal occlusion of the mid RCA with TIMI-1 flow.. 2. Moderately elevated left ventricular filling pressure with LVEF of 35 to 45%. There is inferior akinesis. 3. Successful PTCA of the mid RCA using a 2.5 x 15 mm balloon with reduction of stenosis from 99% to 30% and restoration of TIMI-3 flow.  History of  Present Illness:    David [name] was recently noted to have worsening LVF.  He was set up for cardiac catheterization which demonstrated severe native 3 v CAD and patent bypass grafts.  Med Rx was recommended.  He returns for f/u.  He is here alone.  He is doing well.  He has not had any chest pain, significant shortness of breath, orthopnea, leg swelling or syncope.        Past Medical History:  Diagnosis Date  . CAD (coronary artery disease) 03/22/2018   S/p Inf STEMI 10/19 >> Tx with POBA to RCA then emergent CABG (LIMA to LAD, Free RIMA to OM, and SVG to PDA) with Dr. Roxy Manns // Myoview 9/21: EF 33, inferolateral and apical lateral infarct, no ischemia; high risk   . Chronic combined systolic & diastolic CHF 58/52/7782   Post CABG volume overload req'd Lasix gtt // complicated by AKI // Echo 03/31/18:  Mild LVH, inf AK, EF 50, Gr 2 DD, Asc aorta 39 mm (mildly dilated), mild MAC, trivial MR, mild LAE, mild dilated RV, normal RVSF, mild RAE, trivial effusion // Echo 03/23/18: Mod conc LVH, EF 50-55, inf AK, normal diastolic function, Asc aorta 40 mm, mild RV dilation // Echo 10/21: EF 25-30, inferoseptal AK, GR 1 DD  . Echocardiogram 02/2020    Echo 10/21: EF 25-30, inferoseptal AK, GR 1 DD, mildly reduced RVSF, RVSP 20.3 mmHg, mild-moderate MR, moderate AV calcification with mild aortic valve sclerosis (no AS), dilated aortic root (42  mm)  . Hyperlipidemia 04/08/2018  . Postoperative atrial fibrillation (HCC)    Tx with Amiodarone    Current Medications: Current Meds  Medication Sig  . aspirin EC 81 MG EC tablet Take 1 tablet (81 mg total) by mouth daily.  . carvedilol (COREG) 3.125 MG tablet Take 1 tablet (3.125 mg total) by mouth 2 (two) times daily with a meal.  . ENTRESTO 24-26 MG Take 1 tablet by mouth 2 (two) times daily.  . NONFORMULARY OR COMPOUNDED ITEM Overnight pulse oximetry  . polyethylene glycol (MIRALAX / GLYCOLAX) 17 g packet Take 17 g by mouth daily.  . potassium chloride  (KLOR-CON) 10 MEQ tablet Take 1 tablet (10 mEq total) by mouth 3 (three) times a week. Take 1 tab every Mon, Wed, Fri (David [name] taking differently: Take 10 mEq by mouth every Monday, Wednesday, and Friday. In the morning)  . rosuvastatin (CRESTOR) 40 MG tablet Take 1 tablet (40 mg total) by mouth daily.  Marland Kitchen torsemide (DEMADEX) 10 MG tablet Take 1 tablet (10 mg total) by mouth 3 (three) times a week. Take 1 tab every Mon, Wed, Fri (David [name] taking differently: Take 10 mg by mouth every Monday, Wednesday, and Friday. In the morning.)  . [DISCONTINUED] ENTRESTO 24-26 MG Take 1 tablet by mouth 2 (two) times daily.     Allergies:   David [name] has no known allergies.   Social History   Tobacco Use  . Smoking status: Former Smoker    Types: Cigarettes    Quit date: 07/03/1977    Years since quitting: 42.7  . Smokeless tobacco: Never Used  Vaping Use  . Vaping Use: Never used  Substance Use Topics  . Alcohol use: Never  . Drug use: Never     Family Hx: The David [name]'s family history includes Rheum arthritis in his brother and mother.  ROS   EKGs/Labs/Other Test Reviewed:    EKG:  EKG is  ordered today.  The ekg ordered today demonstrates normal sinus rhythm, HR 63, normal axis, inf Q waves, PVC, non-specific ST-TW changes, QTc 425 ms, no changes  Recent Labs: 06/27/2019: ALT 18 03/07/2020: BUN 29; Creatinine, Ser 1.52; Hemoglobin 16.1; Platelets 207; Potassium 4.6; Sodium 137   Recent Lipid Panel Lab Results  Component Value Date/Time   CHOL 172 06/27/2019 01:45 PM   CHOL 146 05/14/2018 10:09 AM   TRIG 140 06/27/2019 01:45 PM   HDL 47 06/27/2019 01:45 PM   HDL 48 05/14/2018 10:09 AM   CHOLHDL 3.7 06/27/2019 01:45 PM   LDLCALC 101 (H) 06/27/2019 01:45 PM      Risk Assessment/Calculations:      Physical Exam:    VS:  BP 110/68   Pulse 63   Ht _0  (1.803 m)   Wt 198 lb 12.8 oz (90.2 kg)   SpO2 95%   BMI 27.73 kg/m     Wt Readings from Last 3 Encounters:  04/04/20 198 lb  12.8 oz (90.2 kg)  03/14/20 195 lb (88.5 kg)  03/07/20 195 lb 3.2 oz (88.5 kg)     Constitutional:      Appearance: Healthy appearance. Not in distress.  Neck:     Vascular: No JVR. JVD normal.  Pulmonary:     Effort: Pulmonary effort is normal.     Breath sounds: No wheezing. No rales.  Cardiovascular:     Normal rate. Regular rhythm. Normal S1. Normal S2.     Murmurs: There is no murmur.     Comments: R FA site  w/o hematoma or bruit Edema:    Peripheral edema absent.  Abdominal:     Palpations: Abdomen is soft. There is no hepatomegaly.  Skin:    General: Skin is warm and dry.  Neurological:     General: No focal deficit present.     Mental Status: Alert and oriented to person, place and time.     Cranial Nerves: Cranial nerves are intact.      ASSESSMENT & PLAN:    1. HFrEF (heart failure with reduced ejection fraction) ([place]) 2. 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  Ischemic CM.  NYHA I-II.  His volume is stable.  We discussed the importance of GDMT to improve EF.  We also discussed the plan to refer to EP if his EF does not recover to > 35%.  Continue current dose of Carvedilol, Entresto.  CMET is pending today.  If his renal function and K+ are stable, I will try to initiate Spironolactone 12.5 mg.  He is not really symptomatic.  Therefore, I do not think he will need the addition of SGLT2i.  I will f/u with him in 6 weeks.  If GDMT is maximized at that point, I will arrange his f/u echocardiogram.   3. Coronary artery disease involving native coronary artery of native heart without angina pectoris S/p inf MI tx with POBA to RCA and subsequent CABG in 02/2018.  He is not having angina.  Continue ASA, beta-blocker, statin Rx.   4. Stage 3a chronic kidney disease ([place]) F/u CMET is obtained today to f/u on renal function.   5. Mixed hyperlipidemia Goal LDL < 70.  Continue high intensity statin Rx.  Repeat CMET, Lipids are pending from today.      Dispo:  Return in about 6  weeks (around 05/16/2020) for Routine Follow Up, w/ Richardson Dopp, PA-C, in person.   Medication Adjustments/Labs and Tests Ordered: Current medicines are reviewed at length with the David [name] today.  Concerns regarding medicines are outlined above.  Tests Ordered: Orders Placed This Encounter  Procedures  . EKG 12-Lead   Medication Changes: Meds ordered this encounter  Medications  . ENTRESTO 24-26 MG    Sig: Take 1 tablet by mouth 2 (two) times daily.    Dispense:  90 tablet    Refill:  1    Signed, Richardson Dopp, PA-C  04/04/2020 8:25 AM    [facility] [place], [place], [place]  [address] Phone: [phone]; Fax: [phone]

## 2020-04-04 ENCOUNTER — Other Ambulatory Visit: Payer: Federal, State, Local not specified - PPO

## 2020-04-04 ENCOUNTER — Other Ambulatory Visit: Payer: Self-pay

## 2020-04-04 ENCOUNTER — Encounter: Payer: Self-pay | Admitting: Physician Assistant

## 2020-04-04 ENCOUNTER — Ambulatory Visit: Payer: Federal, State, Local not specified - PPO | Admitting: Physician Assistant

## 2020-04-04 VITALS — BP 110/68 | HR 63 | Ht 71.0 in | Wt 198.8 lb

## 2020-04-04 DIAGNOSIS — E782 Mixed hyperlipidemia: Secondary | ICD-10-CM

## 2020-04-04 DIAGNOSIS — I2510 Atherosclerotic heart disease of native coronary artery without angina pectoris: Secondary | ICD-10-CM

## 2020-04-04 DIAGNOSIS — N1831 Chronic kidney disease, stage 3a: Secondary | ICD-10-CM | POA: Diagnosis not present

## 2020-04-04 DIAGNOSIS — R072 Precordial pain: Secondary | ICD-10-CM

## 2020-04-04 DIAGNOSIS — I5020 Unspecified systolic (congestive) heart failure: Secondary | ICD-10-CM

## 2020-04-04 DIAGNOSIS — I255 Ischemic cardiomyopathy: Secondary | ICD-10-CM | POA: Diagnosis not present

## 2020-04-04 LAB — COMPREHENSIVE METABOLIC PANEL
ALT: 15 IU/L (ref 0–44)
AST: 21 IU/L (ref 0–40)
Albumin/Globulin Ratio: 1.6 (ref 1.2–2.2)
Albumin: 4.6 g/dL (ref 3.7–4.7)
Alkaline Phosphatase: 81 IU/L (ref 44–121)
BUN/Creatinine Ratio: 22 (ref 10–24)
BUN: 32 mg/dL — ABNORMAL HIGH (ref 8–27)
Bilirubin Total: 0.8 mg/dL (ref 0.0–1.2)
CO2: 20 mmol/L (ref 20–29)
Calcium: 9.2 mg/dL (ref 8.6–10.2)
Chloride: 107 mmol/L — ABNORMAL HIGH (ref 96–106)
Creatinine, Ser: 1.44 mg/dL — ABNORMAL HIGH (ref 0.76–1.27)
GFR calc Af Amer: 54 mL/min/{1.73_m2} — ABNORMAL LOW (ref >59)
GFR calc non Af Amer: 47 mL/min/{1.73_m2} — ABNORMAL LOW (ref >59)
Globulin, Total: 2.8 g/dL (ref 1.5–4.5)
Glucose: 102 mg/dL — ABNORMAL HIGH (ref 65–99)
Potassium: 4.9 mmol/L (ref 3.5–5.2)
Sodium: 138 mmol/L (ref 134–144)
Total Protein: 7.4 g/dL (ref 6.0–8.5)

## 2020-04-04 LAB — LIPID PANEL
Chol/HDL Ratio: 3 ratio (ref 0.0–5.0)
Cholesterol, Total: 144 mg/dL (ref 100–199)
HDL: 48 mg/dL (ref >39)
LDL Chol Calc (NIH): 78 mg/dL (ref 0–99)
Triglycerides: 94 mg/dL (ref 0–149)
VLDL Cholesterol Cal: 18 mg/dL (ref 5–40)

## 2020-04-04 MED ORDER — ENTRESTO 24-26 MG PO TABS
1.0000 | ORAL_TABLET | Freq: Two times a day (BID) | ORAL | 1 refills | Status: DC
Start: 2020-04-04 — End: 2020-11-05

## 2020-04-04 NOTE — Patient Instructions (Signed)
Medication Instructions:   Your physician recommends that you continue on your current medications as directed. Please refer to the Current Medication list given to you today.  *If you need a refill on your cardiac medications before your next appointment, please call your pharmacy*   Lab Work: NONE ORDERED  TODAY   If you have labs (blood work) drawn today and your tests are completely normal, you will receive your results only by: Marland Kitchen MyChart Message (if you have MyChart) OR . A paper copy in the mail If you have any lab test that is abnormal or we need to change your treatment, we will call you to review the results.   Testing/Procedures: NONE ORDERED  TODAY   Follow-Up: At [facility], you and your health needs are our priority.  As part of our continuing mission to provide you with exceptional heart care, we have created designated Provider Care Teams.  These Care Teams include your primary Cardiologist (physician) and Advanced Practice Providers (APPs -  Physician Assistants and Nurse Practitioners) who all work together to provide you with the care you need, when you need it.  We recommend signing up for the patient portal called "MyChart".  Sign up information is provided on this After Visit Summary.  MyChart is used to connect with patients for Virtual Visits (Telemedicine).  Patients are able to view lab/test results, encounter notes, upcoming appointments, etc.  Non-urgent messages can be sent to your provider as well.   To learn more about what you can do with MyChart, go to [email or web address].    Your next appointment:   6 week(s)  The format for your next appointment:   In Person  Provider:   You will see one of the following Advanced Practice Providers on your designated Care Team:    Tereso Newcomer, [place]    Other Instructions

## 2020-04-05 ENCOUNTER — Telehealth: Payer: Self-pay | Admitting: *Deleted

## 2020-04-05 NOTE — Telephone Encounter (Signed)
Left message to call office

## 2020-04-05 NOTE — Telephone Encounter (Signed)
-----   Message from Beatrice Lecher, PA-C sent at 04/04/2020  5:15 PM EST ----- LDL 78.  Goal < 70.  LFTs normal.  Creatinine stable.  K+ normal.  PLAN:  - DC Torsemide - DC K+ - Weigh daily.  - If Wt up > 3 lbs or he develops significant leg swelling >> take Torsemide 10 mg and call - Start Spironolactone 12.5 mg once daily (for CHF)  - Check BMET x 2 (5 days and 14 days after 1st dose of spironolactone) - Start Ezetimibe 10 mg once daily (for cholesterol)  - Fasting Lipids and LFTs in 3 mos. Tereso Newcomer, PA-C    04/04/2020 5:09 PM

## 2020-04-06 NOTE — Telephone Encounter (Signed)
[facility] Triage Reviewed labs with patient and indications for medication changes. He is out of town right now and cannot write down instructions. He would like to call back Monday 11/15.  When he calls back on Monday 11/15 >> please go over med changes, send in Rx and schedule labs. Tereso Newcomer, PA-C   04/06/2020 9:09 AM

## 2020-04-10 NOTE — Telephone Encounter (Signed)
Called patient, the phone rang several times and then stopped.  No one picked up.  Was unable to leave a VM.

## 2020-04-18 ENCOUNTER — Telehealth: Payer: Self-pay

## 2020-04-18 DIAGNOSIS — I5020 Unspecified systolic (congestive) heart failure: Secondary | ICD-10-CM

## 2020-04-18 MED ORDER — EZETIMIBE 10 MG PO TABS: 10.0000 mg | ORAL_TABLET | Freq: Every day | ORAL | 3 refills | Status: DC

## 2020-04-18 MED ORDER — SPIRONOLACTONE 25 MG PO TABS: 12.5000 mg | ORAL_TABLET | Freq: Every day | ORAL | 3 refills | Status: DC

## 2020-04-18 NOTE — Telephone Encounter (Signed)
-----   Message from Beatrice Lecher, [place] sent at 04/11/2020 11:53 AM EST ----- Please follow-up with the patient regarding instructions for medication changes. He was contacted last week but never called back. LDL 78. Goal < 70. LFTs normal. Creatinine stable. K+ normal.  PLAN:  - DC Torsemide - DC K+ - Weigh daily.  - If Wt up > 3 lbs or he develops significant leg swelling >> take Torsemide 10 mg and call - Start Spironolactone 12.5 mg once daily (for CHF)  - Check BMET x 2 (5 days and 14 days after 1st dose of spironolactone) - Start Ezetimibe 10 mg once daily (for cholesterol)  - Fasting Lipids and LFTs in 3 mos. Tereso Newcomer, PA-C 04/11/2020 11:53 AM

## 2020-04-18 NOTE — Telephone Encounter (Signed)
The patient has been notified of the result and verbalized understanding.  All questions (if any) were answered. Patient will stop Torsemide and Potassium today and weigh daily. Patient aware to call if his weight goes up more than 3 pounds in a day. New medications called in and repeat labs set up for 04/25/20. Lajoyce Corners, CMA 04/18/2020 10:14 AM

## 2020-04-25 ENCOUNTER — Other Ambulatory Visit: Payer: Self-pay

## 2020-04-25 ENCOUNTER — Other Ambulatory Visit: Payer: Federal, State, Local not specified - PPO

## 2020-04-25 DIAGNOSIS — I5020 Unspecified systolic (congestive) heart failure: Secondary | ICD-10-CM

## 2020-04-25 LAB — BASIC METABOLIC PANEL
BUN/Creatinine Ratio: 18 (ref 10–24)
BUN: 21 mg/dL (ref 8–27)
CO2: 20 mmol/L (ref 20–29)
Calcium: 9.1 mg/dL (ref 8.6–10.2)
Chloride: 106 mmol/L (ref 96–106)
Creatinine, Ser: 1.17 mg/dL (ref 0.76–1.27)
GFR calc Af Amer: 69 mL/min/{1.73_m2} (ref >59)
GFR calc non Af Amer: 60 mL/min/{1.73_m2} (ref >59)
Glucose: 95 mg/dL (ref 65–99)
Potassium: 5 mmol/L (ref 3.5–5.2)
Sodium: 139 mmol/L (ref 134–144)

## 2020-05-01 NOTE — Telephone Encounter (Signed)
See 04/18/20 phone note

## 2020-05-06 IMAGING — DX DG CHEST 1V PORT
1 series · 1 of 1 positions shown · non-contrast
Comparison: None

CLINICAL DATA: 77-year-old male with an intra-aortic balloon pump.

EXAM:
PORTABLE CHEST 1 VIEW

[chest]
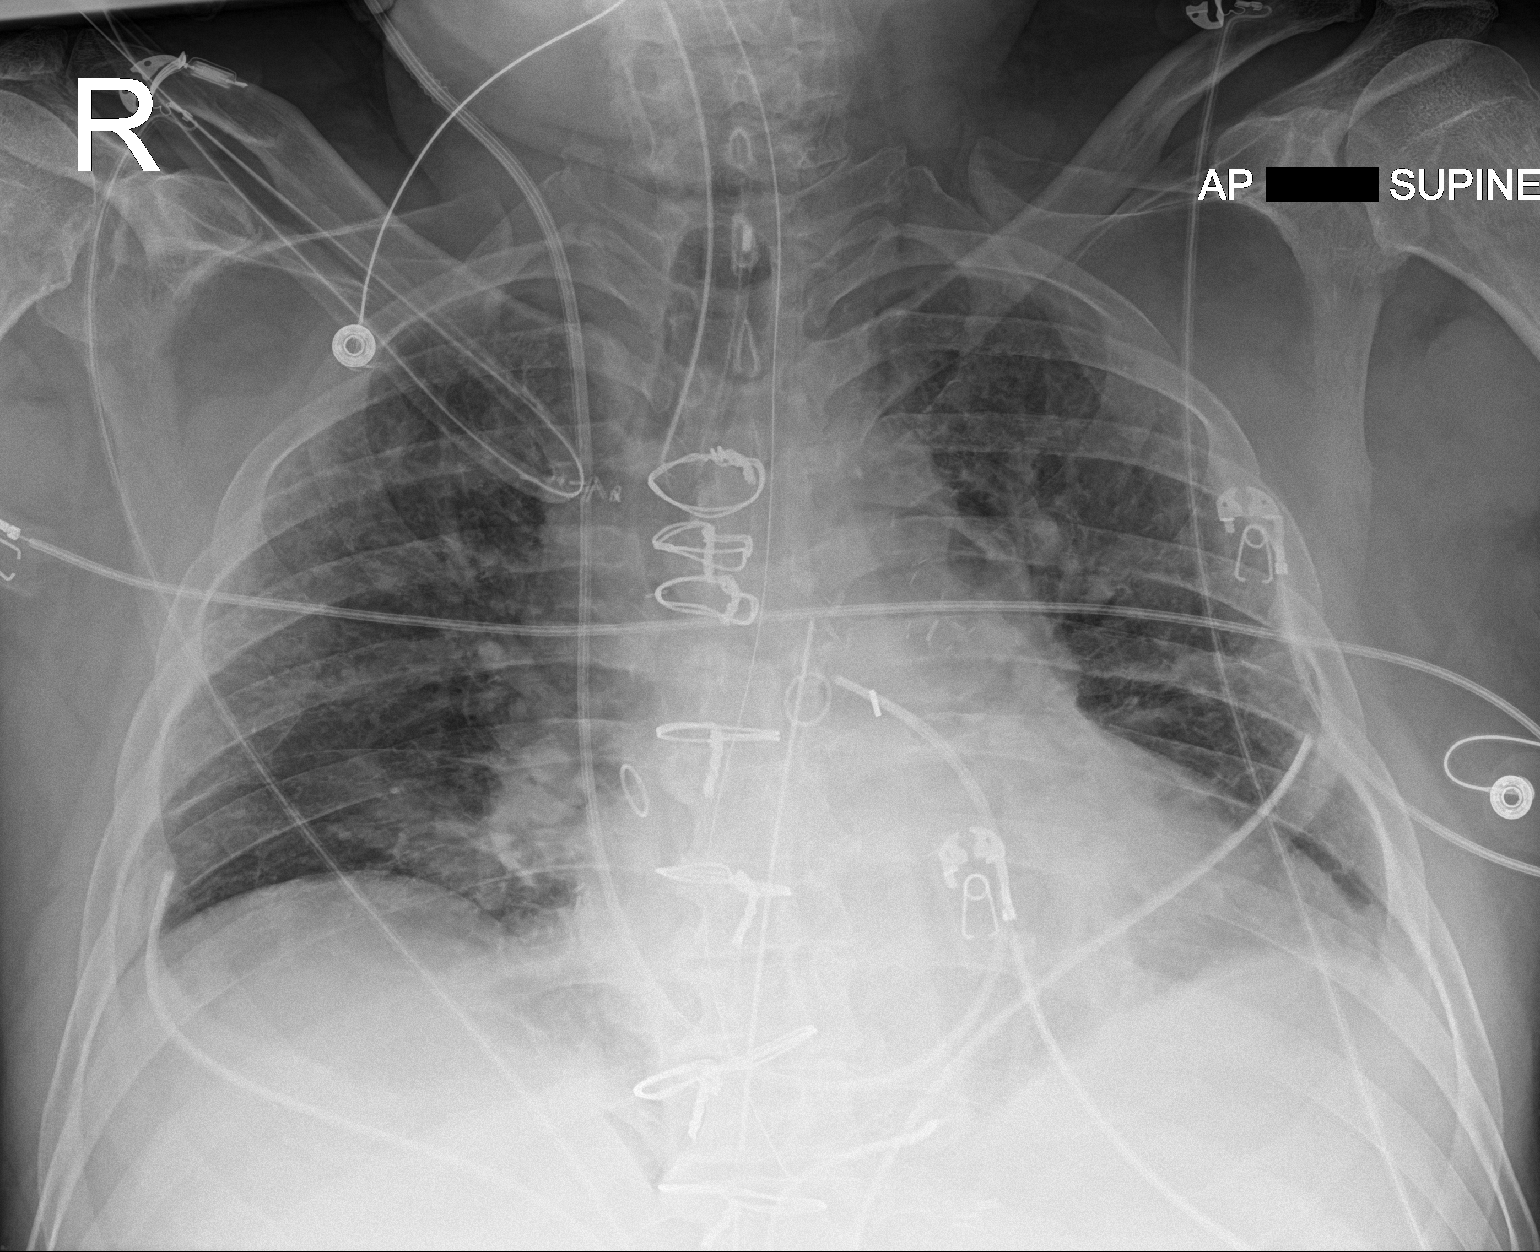

[1 of 1 positions shown; findings below may reference images not displayed]

FINDINGS: The radiopaque tip of an intra-aortic balloon pump is noted in the
region of the mid descending thoracic aorta which has been adjusted
and advanced superiorly and positioned over the region of the aortic
arch or proximal descending thoracic aorta on image number 2.

An endotracheal tube with tip approximately 4 cm above the carina,
right IJ Swan-Ganz catheter with tip in the region of the
bifurcation of the pulmonary trunk tilted towards the right
pulmonary artery, and bilateral chest tubes.

An enteric tube is noted with side-port over the distal esophagus
and tip in the region of the gastroesophageal junction. Recommend
further advancing of the tube into the stomach.

An inferiorly placed mediastinal drain with tip inferior to the
aortic arch noted.

There is cardiomegaly with mild vascular congestion. Minimal
bibasilar atelectatic changes noted. No large pleural effusion or
pneumothorax. Median sternotomy wires. No acute osseous pathology.
IMPRESSION: 1. Intra-aortic balloon pump in the region of the mid descending
thoracic aorta which has been adjusted and advanced superiorly and
positioned over the region of the aortic arch or proximal descending
thoracic aorta on image number 2.
2. Endotracheal and enteric tubes as described above. Recommend
further advancing the enteric tube into the stomach.
3. Cardiomegaly with mild vascular congestion.

## 2020-05-07 IMAGING — DX DG CHEST 1V PORT
1 series · 1 of 1 positions shown · non-contrast
Comparison: Radiograph March 22, 2018.

CLINICAL DATA: Intra-aortic balloon pump assistance.

EXAM:
PORTABLE CHEST 1 VIEW

[chest]
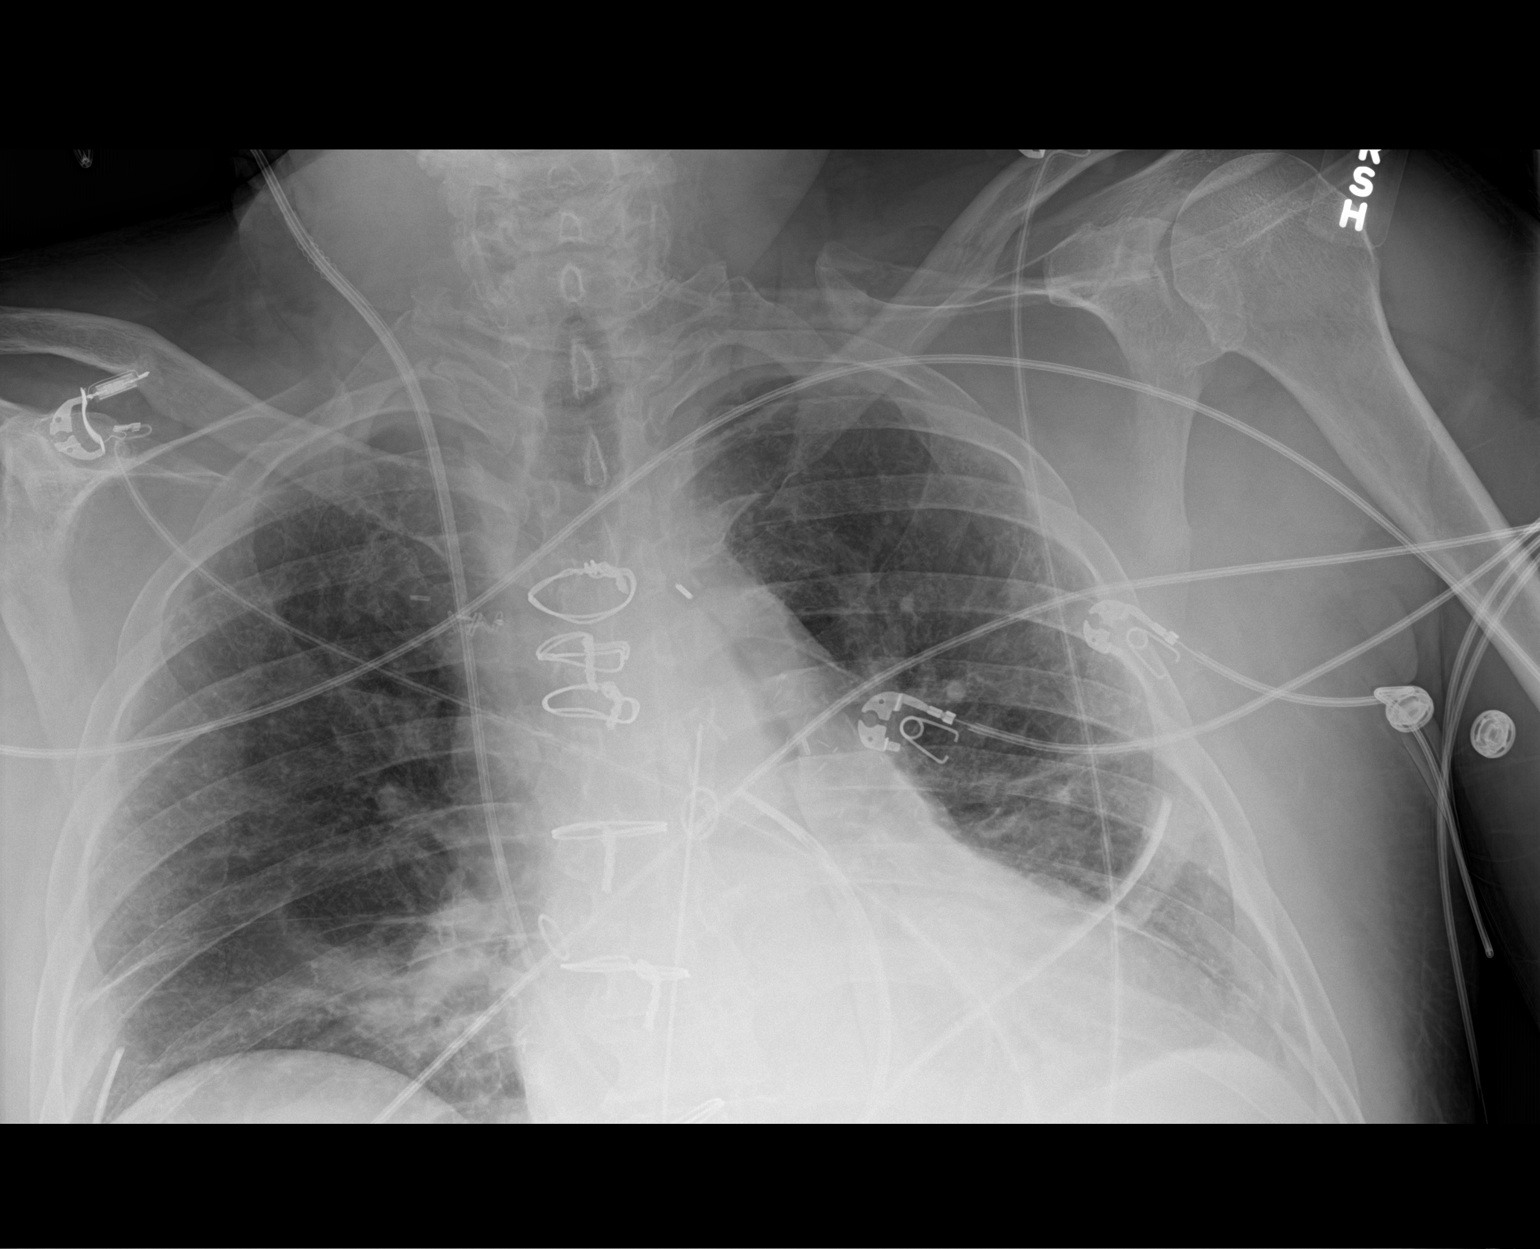

[1 of 1 positions shown; findings below may reference images not displayed]

FINDINGS: Stable cardiomediastinal silhouette. Endotracheal and nasogastric
tubes have been removed. Right internal jugular Swan-Ganz catheter
is unchanged in position. Stable bilateral chest tubes are noted
without pneumothorax. Mild right basilar subsegmental atelectasis is
noted. Distal tip of aortic balloon catheter is seen over expected
position of transverse aortic arch. Bony thorax is unremarkable.
IMPRESSION: Stable support apparatus. Distal tip of aortic balloon catheter is
unchanged compared to prior exam. Stable bilateral chest tubes
without pneumothorax. Mild right basilar subsegmental atelectasis.

## 2020-05-08 IMAGING — DX DG CHEST 1V PORT
1 series · 1 of 1 positions shown · non-contrast
Comparison: Prior chest x-ray obtained yesterday, 03/23/2018

CLINICAL DATA: 77-year-old male status post CABG

EXAM:
PORTABLE CHEST 1 VIEW

[chest ap]
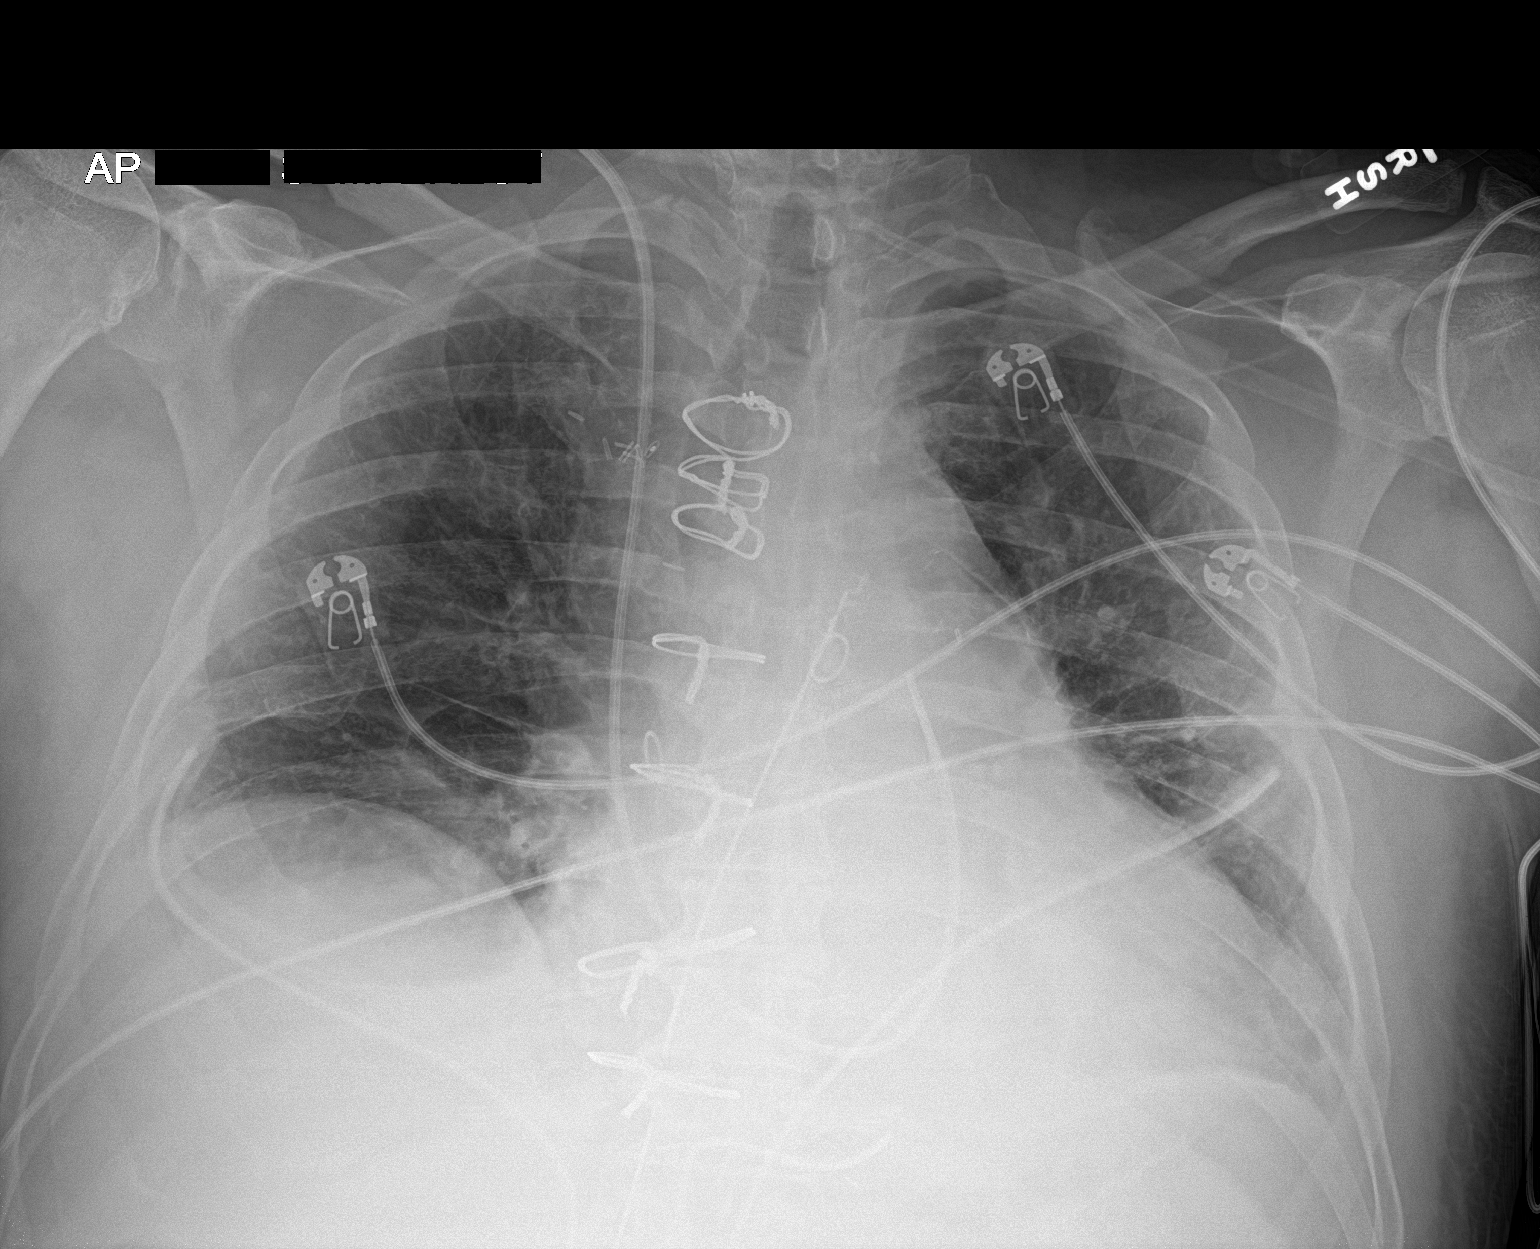

[1 of 1 positions shown; findings below may reference images not displayed]

FINDINGS: A right IJ vascular sheath convey is a Swan-Ganz catheter into the
heart. The intra-aortic balloon pump has been removed. The tip of
the Brandt overlies the main pulmonary outflow tract. Mediastinal
drain and left-sided chest tube remain in position. No evidence of
pneumothorax. Patient is status post median sternotomy with evidence
of prior multivessel CABG. Stable cardiomegaly and mediastinal
widening. Mild vascular congestion, improving. No overt pulmonary
edema. Low inspiratory volumes with bibasilar atelectasis. No acute
osseous abnormality.
IMPRESSION: 1. The intra-aortic balloon pump appears to have been removed.
Otherwise, stable and satisfactory support apparatus.
2. Low inspiratory volumes with mild bibasilar atelectasis.
3. Mild pulmonary vascular congestion without overt edema.

## 2020-05-09 IMAGING — DX DG CHEST 1V PORT
1 series · 1 of 1 positions shown · non-contrast
Comparison: 03/24/2018

CLINICAL DATA: Follow-up chest tube

EXAM:
PORTABLE CHEST 1 VIEW

[chest ap]
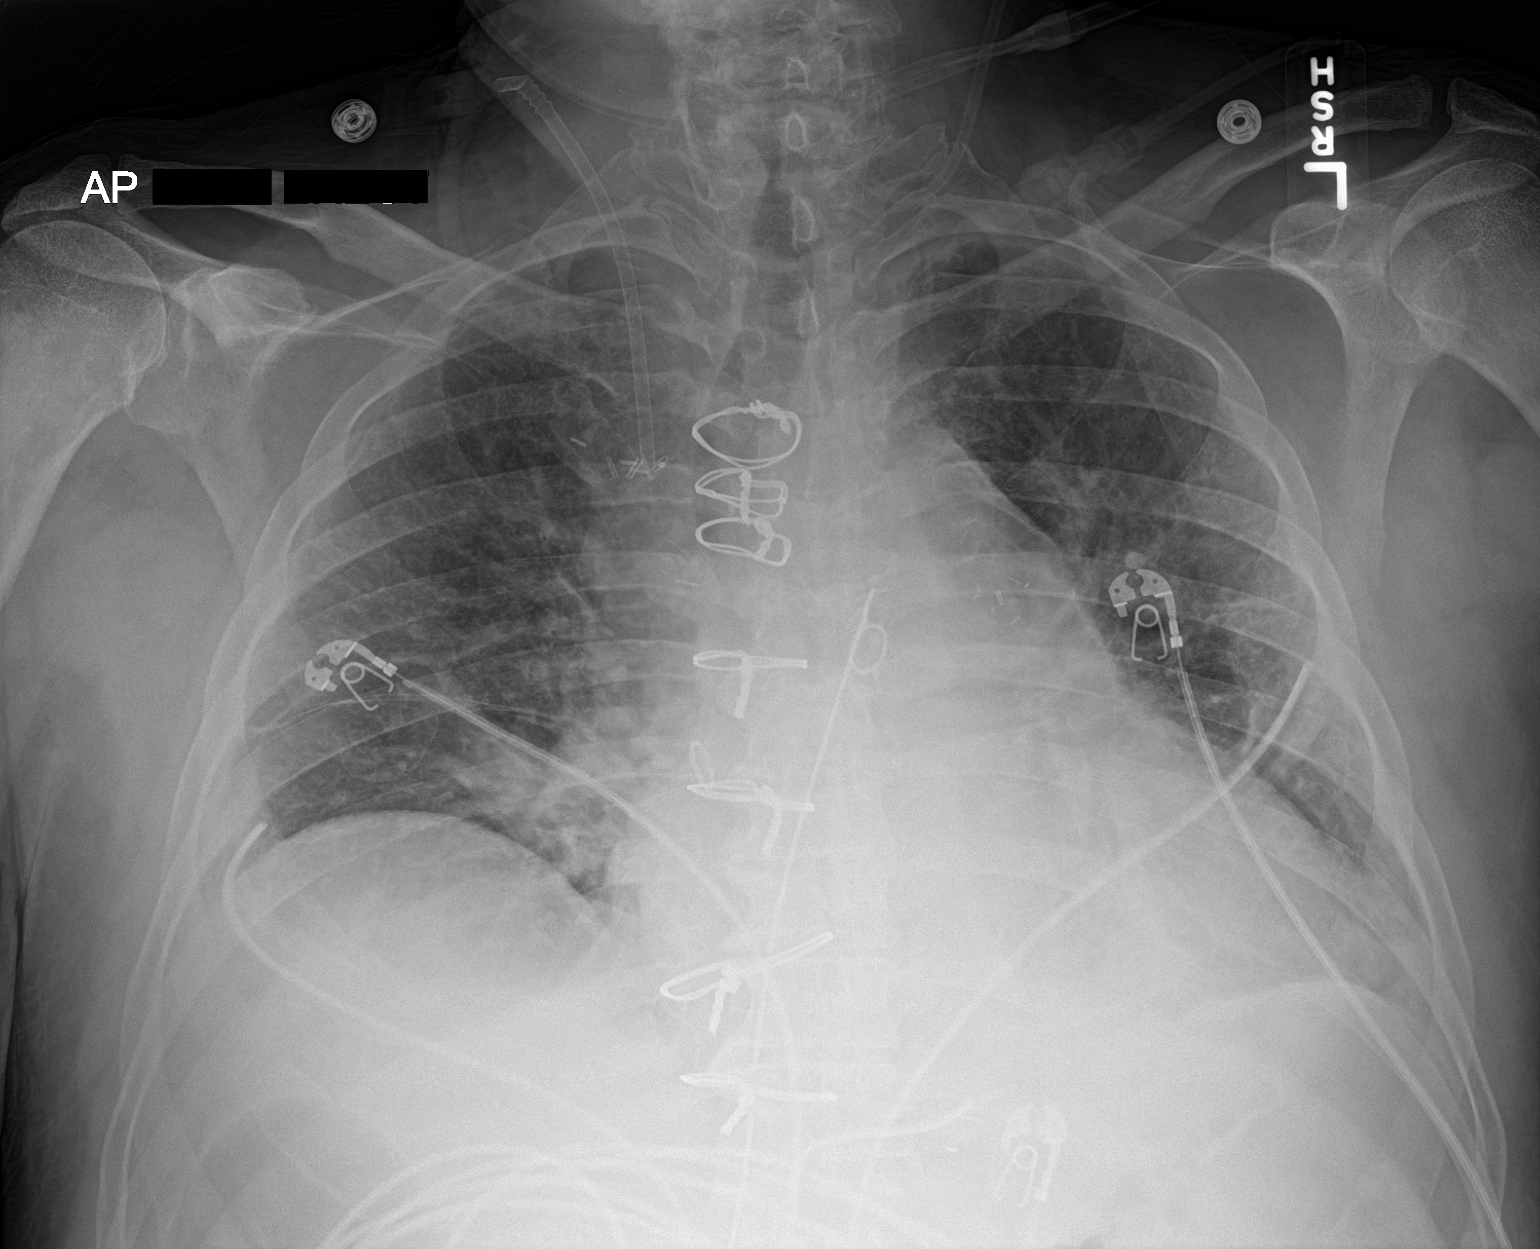

[1 of 1 positions shown; findings below may reference images not displayed]

FINDINGS: Cardiac shadow is enlarged. Mediastinal drain, bilateral
thoracostomy catheters and pericardial drain are noted and stable..
Right jugular sheath is again noted. The Swan-Ganz catheter has been
removed. The lungs are hypoaerated although no focal infiltrate or
sizable effusion is seen. No pneumothorax is noted.
IMPRESSION: Tubes and lines as described.

Poor inspiratory effort without focal infiltrate

## 2020-05-10 IMAGING — DX DG CHEST 1V PORT
1 series · 1 of 1 positions shown · non-contrast
Comparison: Yesterday

CLINICAL DATA: Status post CABG

EXAM:
PORTABLE CHEST 1 VIEW

[chest ap]
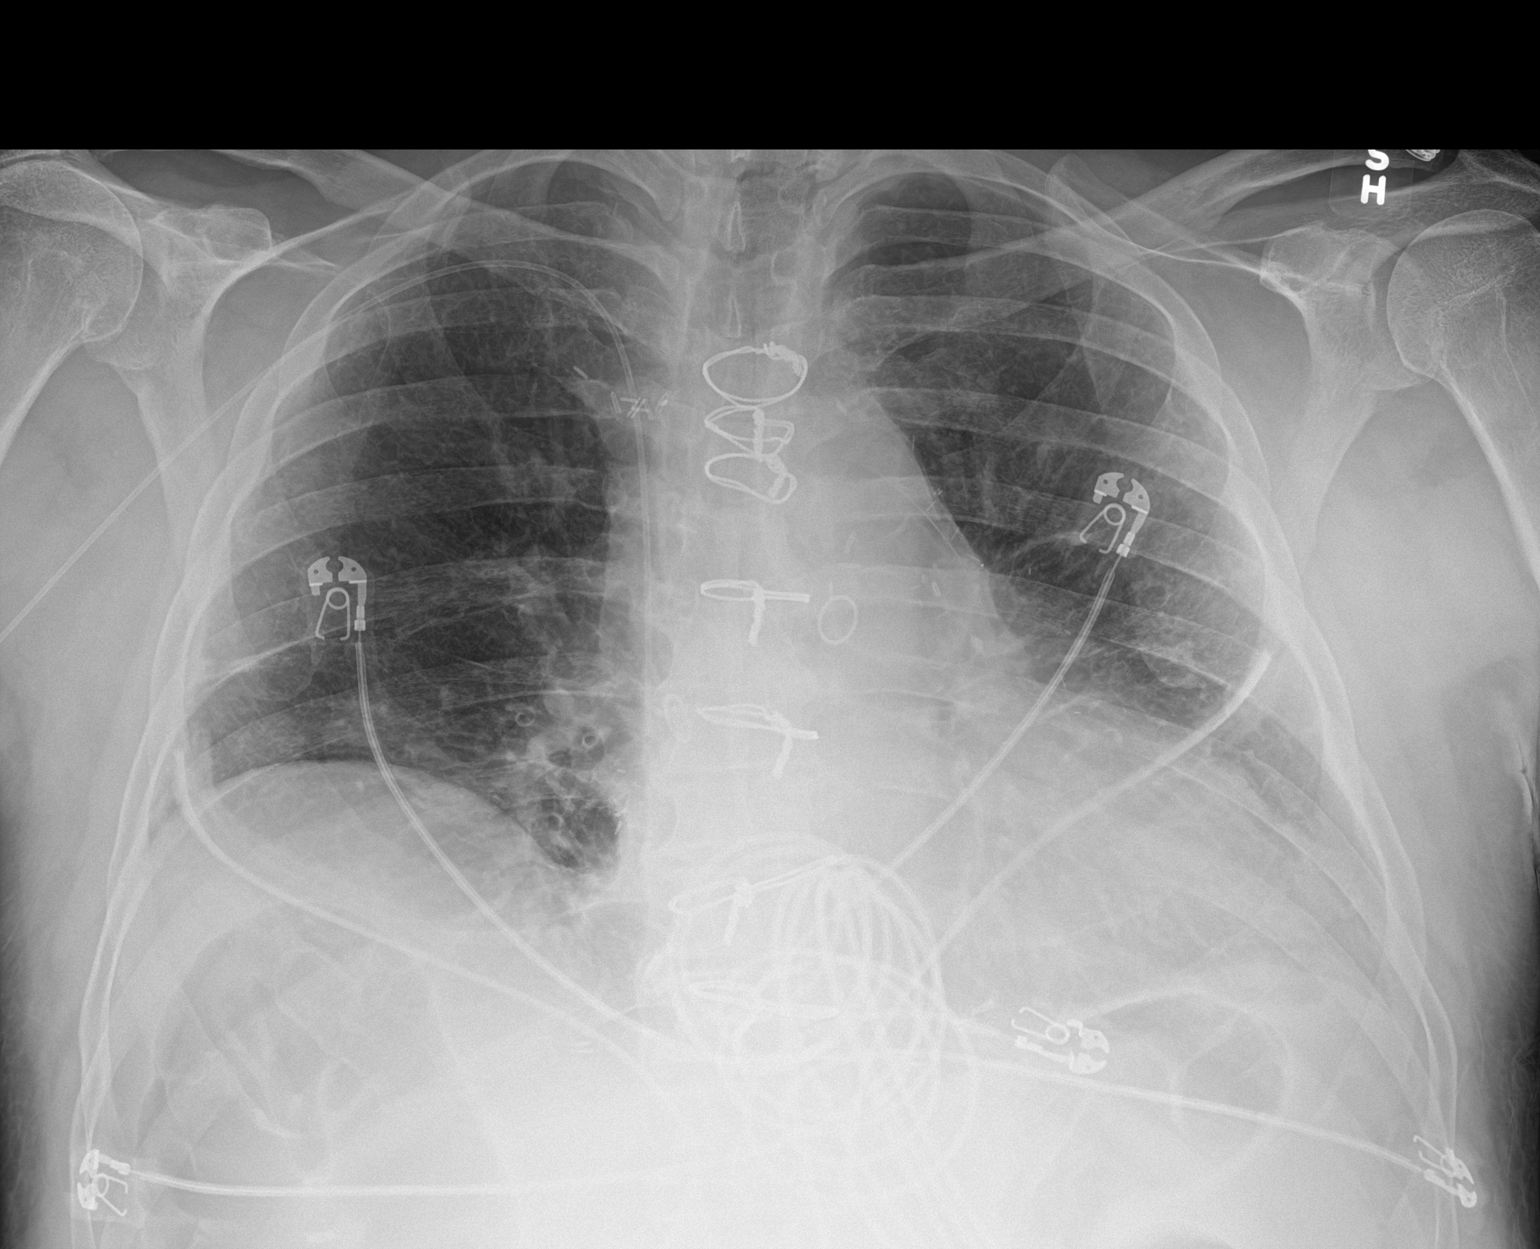

[1 of 1 positions shown; findings below may reference images not displayed]

FINDINGS: PICC on the right has been placed since prior with tip in good
position. The right IJ sheath has been removed. Thoracic drain
remains on the left. There is extensive artifact from EKG leads. No
evidence of pneumothorax; there may be stable pneumomediastinum.
Stable low volumes with atelectasis. Stable postoperative heart
size.
IMPRESSION: 1. Updated central access and removal of a central thoracic drain.
2. Otherwise stable.  Low volumes with atelectasis.

## 2020-05-15 NOTE — Progress Notes (Signed)
Cardiology Office Note:    Date:  05/16/2020   ID:  David [name], DOB 07-18-1940, MRN [id number]  PCP:  Patient, No Pcp Per  [place] Cardiologist:  Sherren Mocha, MD   [place] Electrophysiologist:  None   Referring MD: No ref. provider found   Chief Complaint:  Follow-up (CHF)    Patient Profile:    David [name] is a 79 y.o. male with:   Coronary artery disease  ? S/p inf STEMI in 10/19 tx with POBA of RCA then emergent CABG  Post op AFib managed w/ Amiodarone ? Cath 10/21: 3/3 grafts patent; med Rx   Heart failure with reduced ejection fraction  ? EF 35-45 at time of MI >> improved to normal ? Echocardiogram 10/21: EF 25-30, Gr 1 DD  Chronic kidney disease   Hyperlipidemia   Prior CV studies: Cardiac catheterization 03/15/2020 Severe native 3v CAD L-LAD patent Free RIMA-OM patent S-PDA patent  Echocardiogram 02/29/20 Inf-sept AK, no LV thrombus, EF 25-30, Gr 1 DD, mildly reduced RVSF, RVSP 20.3, mild to mod MR, AV sclerosis (no AS), mild dilation of aortic root (42 mm)  Myoview 02/13/20 EF 33, inf-lat and inf AK, inf-lat and apical lat infarct, no peri-infarct ischemia  Echo 03/31/18 Mild LVH, inf AK, EF 50, Gr 2 DD, Asc aorta 39 mm (mildly dilated), mild MAC, trivial MR, mild LAE, mild dilated RV, normal RVSF, mild RAE, trivial effusion  Echo 03/23/18 Mod conc LVH, EF 50-55, inf AK, normal diastolic function, Asc aorta 40 mm, mild RV dilation  Cardiac Catheterization10/28/19 Conclusions: 1. Severe three-vessel CAD, as detailed below. Culprit lesion is acute plaque rupture and thrombotic subtotal occlusion of the mid RCA with TIMI-1 flow.. 2. Moderately elevated left ventricular filling pressure with LVEF of 35 to 45%. There is inferior akinesis. 3. Successful PTCA of the mid RCA using a 2.5 x 15 mm balloon with reduction of stenosis from 99% to 30% and restoration of TIMI-3 flow.   History of Present Illness:    Mr.  [name] was last seen in 11/21.  I stopped his Torsemide and placed him on Spironolactone.  He returns for f/u.  He is here alone.  He has done well since his last visit.  He has not had chest pain, syncope, near syncope, orthopnea, leg edema.  He has a hx of shortness of breath with some activities.  He has not had shortness of breath with regular activities.     Past Medical History:  Diagnosis Date  . CAD (coronary artery disease) 03/22/2018   S/p Inf STEMI 10/19 >> Tx with POBA to RCA then emergent CABG (LIMA to LAD, Free RIMA to OM, and SVG to PDA) with Dr. Roxy Manns // Myoview 9/21: EF 33, inferolateral and apical lateral infarct, no ischemia; high risk   . Chronic combined systolic & diastolic CHF 47/82/9562   Post CABG volume overload req'd Lasix gtt // complicated by AKI // Echo 03/31/18:  Mild LVH, inf AK, EF 50, Gr 2 DD, Asc aorta 39 mm (mildly dilated), mild MAC, trivial MR, mild LAE, mild dilated RV, normal RVSF, mild RAE, trivial effusion // Echo 03/23/18: Mod conc LVH, EF 50-55, inf AK, normal diastolic function, Asc aorta 40 mm, mild RV dilation // Echo 10/21: EF 25-30, inferoseptal AK, GR 1 DD  . Echocardiogram 02/2020    Echo 10/21: EF 25-30, inferoseptal AK, GR 1 DD, mildly reduced RVSF, RVSP 20.3 mmHg, mild-moderate MR, moderate AV calcification with mild aortic valve sclerosis (no AS), dilated  aortic root (42 mm)  . Hyperlipidemia 04/08/2018  . Postoperative atrial fibrillation (HCC)    Tx with Amiodarone    Current Medications: Current Meds  Medication Sig  . aspirin EC 81 MG EC tablet Take 1 tablet (81 mg total) by mouth daily.  . carvedilol (COREG) 3.125 MG tablet Take 1 tablet (3.125 mg total) by mouth 2 (two) times daily with a meal.  . ENTRESTO 24-26 MG Take 1 tablet by mouth 2 (two) times daily.  Marland Kitchen ezetimibe (ZETIA) 10 MG tablet Take 1 tablet (10 mg total) by mouth daily.  . NONFORMULARY OR COMPOUNDED ITEM Overnight pulse oximetry  . polyethylene glycol (MIRALAX /  GLYCOLAX) 17 g packet Take 17 g by mouth daily.  . rosuvastatin (CRESTOR) 40 MG tablet Take 1 tablet (40 mg total) by mouth daily.  Marland Kitchen spironolactone (ALDACTONE) 25 MG tablet Take 0.5 tablets (12.5 mg total) by mouth daily.     Allergies:   Patient has no known allergies.   Social History   Tobacco Use  . Smoking status: Former Smoker    Types: Cigarettes    Quit date: 07/03/1977    Years since quitting: 42.8  . Smokeless tobacco: Never Used  Vaping Use  . Vaping Use: Never used  Substance Use Topics  . Alcohol use: Never  . Drug use: Never     Family Hx: The patient's family history includes Rheum arthritis in his brother and mother.  ROS   EKGs/Labs/Other Test Reviewed:    EKG:  EKG is  not ordered today.  The ekg ordered today demonstrates n/a  Recent Labs: 03/07/2020: Hemoglobin 16.1; Platelets 207 04/04/2020: ALT 15 04/25/2020: BUN 21; Creatinine, Ser 1.17; Potassium 5.0; Sodium 139   Recent Lipid Panel Lab Results  Component Value Date/Time   CHOL 144 04/04/2020 07:40 AM   TRIG 94 04/04/2020 07:40 AM   HDL 48 04/04/2020 07:40 AM   CHOLHDL 3.0 04/04/2020 07:40 AM   CHOLHDL 3.7 06/27/2019 01:45 PM   LDLCALC 78 04/04/2020 07:40 AM   LDLCALC 101 (H) 06/27/2019 01:45 PM      Risk Assessment/Calculations:      Physical Exam:    VS:  BP (!) 100/56   Pulse 78   Ht _0  (1.803 m)   Wt 203 lb 12.8 oz (92.4 kg)   SpO2 97%   BMI 28.42 kg/m     Wt Readings from Last 3 Encounters:  05/16/20 203 lb 12.8 oz (92.4 kg)  04/04/20 198 lb 12.8 oz (90.2 kg)  03/14/20 195 lb (88.5 kg)     Constitutional:      Appearance: Healthy appearance. Not in distress.  Neck:     Vascular: JVD normal.  Pulmonary:     Effort: Pulmonary effort is normal.     Breath sounds: No wheezing. No rales.  Cardiovascular:     Normal rate. Regular rhythm. Normal S1. Normal S2.     Murmurs: There is no murmur.  Edema:    Peripheral edema absent.  Abdominal:     Palpations:  Abdomen is soft. There is no hepatomegaly.  Skin:    General: Skin is warm and dry.  Neurological:     General: No focal deficit present.     Mental Status: Alert and oriented to person, place and time.     Cranial Nerves: Cranial nerves are intact.       ASSESSMENT & PLAN:    1. HFrEF (heart failure with reduced ejection fraction) ([place]) 2. Shortness of  breath EF 25-30.  Ischemic CM.  NYHA II.  Volume status stable.  BP will not allow further titration of medications.  At this point, I do not think we need to start SGLT2i.  His weight is up since I stopped his Torsemide.  I will get a f/u BMET, BNP today.  If his BNP is up, I will put him back on a low dose loop diuretic and consider adding Dapagliflozin.  Continue current dose of Spironolactone, Carvedilol, Entresto.  Arrange f/u echocardiogram in several weeks.  If EF < 35, refer to EP for consideration of ICD.   3. Coronary artery disease involving native coronary artery of native heart without angina pectoris S/p inf MI tx with POBA to RCA and subsequent CABG in 02/2018.   Cardiac catheterization October 2021 with patent bypass grafts.  He is not having angina.  Continue aspirin, statin, beta-blocker.  4. Stage 3a chronic kidney disease ([place]) Recent creatinine stable.  Repeat BMET today.  5. Mixed hyperlipidemia Ezetimibe recently added due to LDL above goal.  He will have follow-up labs next month.  Continue current dose of ezetimibe, rosuvastatin     Dispo:  Return in about 3 months (around 08/14/2020) for Routine Follow Up, w/ Richardson Dopp, PA-C, in person.   Medication Adjustments/Labs and Tests Ordered: Current medicines are reviewed at length with the patient today.  Concerns regarding medicines are outlined above.  Tests Ordered: Orders Placed This Encounter  Procedures  . Basic metabolic panel  . Pro b natriuretic peptide (BNP)  . Comprehensive metabolic panel  . Lipid panel  . ECHOCARDIOGRAM LIMITED   Medication  Changes: No orders of the defined types were placed in this encounter.   Signed, Richardson Dopp, PA-C  05/16/2020 9:13 AM    [facility] [address], [place], [place]  [address] Phone: [phone]; Fax: [phone]

## 2020-05-16 ENCOUNTER — Encounter: Payer: Self-pay | Admitting: Physician Assistant

## 2020-05-16 ENCOUNTER — Telehealth: Payer: Self-pay | Admitting: Cardiovascular Disease

## 2020-05-16 ENCOUNTER — Ambulatory Visit: Payer: Federal, State, Local not specified - PPO | Admitting: Physician Assistant

## 2020-05-16 ENCOUNTER — Other Ambulatory Visit: Payer: Self-pay

## 2020-05-16 VITALS — BP 100/56 | HR 78 | Ht 71.0 in | Wt 203.8 lb

## 2020-05-16 DIAGNOSIS — E782 Mixed hyperlipidemia: Secondary | ICD-10-CM

## 2020-05-16 DIAGNOSIS — N1831 Chronic kidney disease, stage 3a: Secondary | ICD-10-CM

## 2020-05-16 DIAGNOSIS — I5020 Unspecified systolic (congestive) heart failure: Secondary | ICD-10-CM

## 2020-05-16 DIAGNOSIS — I2510 Atherosclerotic heart disease of native coronary artery without angina pectoris: Secondary | ICD-10-CM | POA: Diagnosis not present

## 2020-05-16 DIAGNOSIS — R0602 Shortness of breath: Secondary | ICD-10-CM | POA: Diagnosis not present

## 2020-05-16 LAB — BASIC METABOLIC PANEL
BUN/Creatinine Ratio: 17 (ref 10–24)
BUN: 23 mg/dL (ref 8–27)
CO2: 25 mmol/L (ref 20–29)
Calcium: 9.2 mg/dL (ref 8.6–10.2)
Chloride: 104 mmol/L (ref 96–106)
Creatinine, Ser: 1.34 mg/dL — ABNORMAL HIGH (ref 0.76–1.27)
GFR calc Af Amer: 59 mL/min/{1.73_m2} — ABNORMAL LOW (ref >59)
GFR calc non Af Amer: 51 mL/min/{1.73_m2} — ABNORMAL LOW (ref >59)
Glucose: 114 mg/dL — ABNORMAL HIGH (ref 65–99)
Potassium: 4.8 mmol/L (ref 3.5–5.2)
Sodium: 140 mmol/L (ref 134–144)

## 2020-05-16 LAB — PRO B NATRIURETIC PEPTIDE: NT-Pro BNP: 488 pg/mL — ABNORMAL HIGH (ref 0–486)

## 2020-05-16 NOTE — Telephone Encounter (Signed)
Patient states he was told to call back in after his virtual visit with David [name] this morning to let Lorin Picket know the MG of the medication he was taking.   spironolactone  Is the medication and he takes a 25 MG tablet once per day.   Thank you!

## 2020-05-16 NOTE — Patient Instructions (Addendum)
Medication Instructions:  Your physician recommends that you continue on your current medications as directed. Please refer to the Current Medication list given to you today.  *If you need a refill on your cardiac medications before your next appointment, please call your pharmacy*  Lab Work: You will have labs drawn today: BMET/BNP  Your physician recommends that you return for lab work on the same day you have your echocardiogram for fasting lipids and CMET  Testing/Procedures: Your physician has requested that you have a limited echocardiogram in 4 weeks. Echocardiography is a painless test that uses sound waves to create images of your heart. It provides your doctor with information about the size and shape of your heart and how well your heart's chambers and valves are working. This procedure takes approximately one hour. There are no restrictions for this procedure.  Follow-Up: At [facility], you and your health needs are our priority.  As part of our continuing mission to provide you with exceptional heart care, we have created designated Provider Care Teams.  These Care Teams include your primary Cardiologist (physician) and Advanced Practice Providers (APPs -  Physician Assistants and Nurse Practitioners) who all work together to provide you with the care you need, when you need it.  Your next appointment:   3 month(s)  The format for your next appointment:   In Person  Provider:   Tereso Newcomer, PA-C  Other Instructions Check your medications and call the office to let us know how much Spironolactone you are taking. Weigh yourself daily, call if weight is up 3 pounds or more in 1 day

## 2020-05-16 NOTE — Telephone Encounter (Signed)
I called and spoke with patient, he is aware to continue Spironolactone 25 mg, 1 tablet by mouth once a day.

## 2020-05-16 NOTE — Telephone Encounter (Signed)
Labs from today reviewed. Continue current dose of Spironolactone. Tereso Newcomer, PA-C    05/16/2020 5:21 PM

## 2020-06-12 ENCOUNTER — Telehealth: Payer: Self-pay | Admitting: Physician Assistant

## 2020-06-12 NOTE — Telephone Encounter (Signed)
°*  STAT* If patient is at the pharmacy, call can be transferred to refill team.   1. Which medications need to be refilled? (please list name of each medication and dose if known)  spironolactone (ALDACTONE) 25 MG tabletspironolactone (ALDACTONE) 25 MG tablet    2. Which pharmacy/location (including street and city if local pharmacy) is medication to be sent to?  [id number] - [place], [place] - [address]  3. Do they need a 30 day or 90 day supply? 90   Pt states he takes one full tablet daily. Please update RX

## 2020-06-13 ENCOUNTER — Other Ambulatory Visit (HOSPITAL_COMMUNITY): Payer: Federal, State, Local not specified - PPO

## 2020-06-13 ENCOUNTER — Other Ambulatory Visit: Payer: Federal, State, Local not specified - PPO

## 2020-06-15 MED ORDER — SPIRONOLACTONE 25 MG PO TABS: 25.0000 mg | ORAL_TABLET | Freq: Every day | ORAL | 3 refills | Status: DC

## 2020-06-15 NOTE — Addendum Note (Signed)
Addended by: Lajoyce Corners on: 06/15/2020 10:57 AM   Modules accepted: Orders

## 2020-06-15 NOTE — Telephone Encounter (Signed)
Pt calling stating that his medication spironolactone was changed to pt taking 1 tablet daily. This change is not on pt's medication list. Please correct on med list please. Thanks

## 2020-06-15 NOTE — Telephone Encounter (Signed)
Patient calling back for a refill on his spironolactone. He states he has been out of the medication for a week. He states entresto was sent, but that was not the medication he requested a refill on.

## 2020-06-15 NOTE — Telephone Encounter (Signed)
Per telephone encounter on 05/16/20, patient called to confirm that he was taking 1 tablet once a day of Spironolactone. Per [place] labs were ok and patient was to continue 1 tablet once a day instead of 0.5 tablet. Prescription updated and sent to pharmacy.

## 2020-07-02 ENCOUNTER — Other Ambulatory Visit: Payer: Federal, State, Local not specified - PPO | Admitting: *Deleted

## 2020-07-02 ENCOUNTER — Encounter (HOSPITAL_COMMUNITY): Payer: Self-pay | Admitting: Physician Assistant

## 2020-07-02 ENCOUNTER — Other Ambulatory Visit (HOSPITAL_COMMUNITY): Payer: Federal, State, Local not specified - PPO

## 2020-07-02 ENCOUNTER — Other Ambulatory Visit: Payer: Self-pay

## 2020-07-02 DIAGNOSIS — E782 Mixed hyperlipidemia: Secondary | ICD-10-CM

## 2020-07-02 DIAGNOSIS — N1831 Chronic kidney disease, stage 3a: Secondary | ICD-10-CM

## 2020-07-03 LAB — COMPREHENSIVE METABOLIC PANEL
ALT: 21 IU/L (ref 0–44)
AST: 21 IU/L (ref 0–40)
Albumin/Globulin Ratio: 1.9 (ref 1.2–2.2)
Albumin: 4.7 g/dL (ref 3.7–4.7)
Alkaline Phosphatase: 80 IU/L (ref 44–121)
BUN/Creatinine Ratio: 19 (ref 10–24)
BUN: 25 mg/dL (ref 8–27)
Bilirubin Total: 0.7 mg/dL (ref 0.0–1.2)
CO2: 21 mmol/L (ref 20–29)
Calcium: 9.7 mg/dL (ref 8.6–10.2)
Chloride: 106 mmol/L (ref 96–106)
Creatinine, Ser: 1.3 mg/dL — ABNORMAL HIGH (ref 0.76–1.27)
GFR calc Af Amer: 61 mL/min/{1.73_m2} (ref >59)
GFR calc non Af Amer: 53 mL/min/{1.73_m2} — ABNORMAL LOW (ref >59)
Globulin, Total: 2.5 g/dL (ref 1.5–4.5)
Glucose: 101 mg/dL — ABNORMAL HIGH (ref 65–99)
Potassium: 5 mmol/L (ref 3.5–5.2)
Sodium: 141 mmol/L (ref 134–144)
Total Protein: 7.2 g/dL (ref 6.0–8.5)

## 2020-07-03 LAB — LIPID PANEL
Chol/HDL Ratio: 2.9 ratio (ref 0.0–5.0)
Cholesterol, Total: 117 mg/dL (ref 100–199)
HDL: 41 mg/dL (ref >39)
LDL Chol Calc (NIH): 42 mg/dL (ref 0–99)
Triglycerides: 212 mg/dL — ABNORMAL HIGH (ref 0–149)
VLDL Cholesterol Cal: 34 mg/dL (ref 5–40)

## 2020-07-24 ENCOUNTER — Ambulatory Visit (HOSPITAL_COMMUNITY): Payer: Federal, State, Local not specified - PPO | Attending: Cardiovascular Disease

## 2020-07-24 ENCOUNTER — Other Ambulatory Visit: Payer: Self-pay

## 2020-07-24 DIAGNOSIS — I5020 Unspecified systolic (congestive) heart failure: Secondary | ICD-10-CM | POA: Insufficient documentation

## 2020-07-24 LAB — ECHOCARDIOGRAM LIMITED
Area-P 1/2: 4.15 cm2
S' Lateral: 4.5 cm

## 2020-07-25 ENCOUNTER — Telehealth: Payer: Self-pay | Admitting: *Deleted

## 2020-07-25 ENCOUNTER — Encounter: Payer: Self-pay | Admitting: Physician Assistant

## 2020-07-25 DIAGNOSIS — I5022 Chronic systolic (congestive) heart failure: Secondary | ICD-10-CM

## 2020-07-25 NOTE — Telephone Encounter (Signed)
Called pt.  He has been made aware that he has been scheduled for his CTA Chest on Friday, 07/27/2020 arriving at 2:15. Pt verbalized understanding.

## 2020-07-25 NOTE — Telephone Encounter (Signed)
-----   Message from Beatrice Lecher, [place] sent at 07/25/2020  8:16 AM EST ----- Please call the pt. His echocardiogram shows some improvement in his EF.  It was 25-30 and is now 35-40.  There is more dilation of the aortic root on this study.  We will need to obtain a chest CT to get a better size of this. PLAN:  -Continue current medications -Arrange chest CTA (diagnosis: Dilated aortic root) -Send copy to PCP Tereso Newcomer, PA-C    07/25/2020 8:08 AM

## 2020-07-26 ENCOUNTER — Inpatient Hospital Stay: Admission: RE | Admit: 2020-07-26 | Payer: Federal, State, Local not specified - PPO | Source: Ambulatory Visit

## 2020-07-27 ENCOUNTER — Ambulatory Visit (INDEPENDENT_AMBULATORY_CARE_PROVIDER_SITE_OTHER)
Admission: RE | Admit: 2020-07-27 | Discharge: 2020-07-27 | Disposition: A | Discharge status: Home / Self Care | Payer: Federal, State, Local not specified - PPO | Source: Ambulatory Visit | Attending: Physician Assistant | Admitting: Physician Assistant

## 2020-07-27 ENCOUNTER — Other Ambulatory Visit: Payer: Self-pay

## 2020-07-27 DIAGNOSIS — I5022 Chronic systolic (congestive) heart failure: Secondary | ICD-10-CM | POA: Diagnosis not present

## 2020-07-27 MED ORDER — IOHEXOL 350 MG/ML SOLN
100.0000 mL | Freq: Once | INTRAVENOUS | Status: AC | PRN
  Administered 2020-07-27: 100 mL via INTRAVENOUS

## 2020-07-30 ENCOUNTER — Encounter: Payer: Self-pay | Admitting: Physician Assistant

## 2020-08-01 ENCOUNTER — Telehealth: Payer: Self-pay

## 2020-08-01 NOTE — Telephone Encounter (Signed)
The CT shows that the aortic root and ascending aorta size are minimally elevated. They are not as large as the echocardiogram suggested. We do not need to do any routine f/u scans for this.   PLAN: -Continue current medications/treatment plan and follow up as scheduled. Result notes above given to pt, per APP. Pt has a f/u already scheduled. He verbalized understanding of the above and has no questions/concerns at this time.

## 2020-08-14 NOTE — Progress Notes (Addendum)
Cardiology Office Note:    Date:  08/15/2020   ID:  David [name], DOB [birth year], MRN [id number]  PCP:  Patient, No Pcp Per   [place]  Cardiologist:  Sherren Mocha, MD  Advanced Practice Provider:  Liliane Shi, PA-C Electrophysiologist:  None       Referring MD: No ref. provider found   Chief Complaint:  Follow-up (CHF)    Patient Profile:    David [name] is a 80 y.o. male with:   Coronary artery disease  ? S/p inf STEMI in 10/19 tx with POBA of RCA then emergent CABG  Post op AFib managed w/ Amiodarone ? Cath 10/21: 3/3 grafts patent; med Rx   Heart failure with reduced ejection fraction  ? EF 35-45 at time of MI >> improved to normal ? Echocardiogram 10/21: EF 25-30, Gr 1 DD ? Echocardiogram 3/22: EF 35-40  Chronic kidney disease  Hyperlipidemia  Dilated aortic root  45 mm on echocardiogram 3/22  CT 3/22: 39 mm; SoV 40 mm  Prior CV studies: Chest CTA 07/27/20 Ascending Ao 39 mm; SoV 40 mm   Echocardiogram 07/24/20 Inf HK, ant-sept, inf-sept HK, sept AK, EF 35-40, mild LVH, Gr 1 DD, mild MR, Ao root 45 mm  Cardiac catheterization 03/14/20 Severe native 3v CAD L-LAD patent Free RIMA-OM patent S-PDA patent  Echocardiogram 02/29/20 Inf-sept AK, no LV thrombus, EF 25-30, Gr 1 DD, mildly reduced RVSF, RVSP 20.3, mild to mod MR, AV sclerosis (no AS), mild dilation of aortic root (42 mm)  Myoview 02/13/20 EF 33, inf-lat and inf AK, inf-lat and apical lat infarct, no peri-infarct ischemia  Echo 03/31/18 Mild LVH, inf AK, EF 50, Gr 2 DD, Asc aorta 39 mm (mildly dilated), mild MAC, trivial MR, mild LAE, mild dilated RV, normal RVSF, mild RAE, trivial effusion  Echo 03/23/18 Mod conc LVH, EF 50-55, inf AK, normal diastolic function, Asc aorta 40 mm, mild RV dilation  Cardiac Catheterization10/28/19 Conclusions: 1. Severe three-vessel CAD, as detailed below. Culprit lesion is acute plaque rupture and thrombotic  subtotal occlusion of the mid RCA with TIMI-1 flow.. 2. Moderately elevated left ventricular filling pressure with LVEF of 35 to 45%. There is inferior akinesis. 3. Successful PTCA of the mid RCA using a 2.5 x 15 mm balloon with reduction of stenosis from 99% to 30% and restoration of TIMI-3 flow.   History of Present Illness:    David [name] was last seen in 04/2020.  He returns for f/u.   Since last seen, he had his follow-up echocardiogram.  His EF has improved to 35-40%.  There was suggestion of dilated aortic root on his echocardiogram.  However, follow-up chest CT demonstrated mildly dilated ascending aorta and at the sinus of Valsalva.  Today, he notes he is doing well.  He has not had chest discomfort.  He has not had significant shortness of breath.  He has been able to walk up his hill without stopping.  He has not had orthopnea, PND, leg edema or syncope      Past Medical History:  Diagnosis Date  . Aortic root enlargement (HCC)    45 mm on echo >> Chest CTA: SoV 40 mm, ascending aorta 39 mm  . CAD (coronary artery disease) 03/22/2018   S/p Inf STEMI 10/19 >> Tx with POBA to RCA then emergent CABG (LIMA to LAD, Free RIMA to OM, and SVG to PDA) with Dr. Roxy Manns // Myoview 9/21: EF 33, inferolateral and apical lateral infarct, no  ischemia; high risk   . Echocardiogram 02/2020    Echo 10/21: EF 25-30, inferoseptal AK, GR 1 DD, mildly reduced RVSF, RVSP 20.3 mmHg, mild-moderate MR, moderate AV calcification with mild aortic valve sclerosis (no AS), dilated aortic root (42 mm)  . HFrEF (heart failure with reduced ejection fraction) ([place]) 04/08/2018   Post CABG volume overload req'd Lasix gtt // complicated by AKI // Echo 03/31/18:  Mild LVH, inf AK, EF 50, Gr 2 DD, Asc aorta 39 mm (mildly dilated), mild MAC, trivial MR, mild LAE, mild dilated RV, normal RVSF, mild RAE, trivial effusion // Echo 03/23/18: Mod conc LVH, EF 50-55, inf AK, normal diastolic function, Asc aorta 40 mm, mild RV dilation  // Echo 10/21: EF 25-30 // Echo 3/22:  EF 35-40  . Hyperlipidemia 04/08/2018  . Postoperative atrial fibrillation (HCC)    Tx with Amiodarone    Current Medications: Current Meds  Medication Sig  . aspirin EC 81 MG EC tablet Take 1 tablet (81 mg total) by mouth daily.  . carvedilol (COREG) 3.125 MG tablet Take 1 tablet (3.125 mg total) by mouth 2 (two) times daily with a meal.  . ENTRESTO 24-26 MG Take 1 tablet by mouth 2 (two) times daily.  Marland Kitchen ezetimibe (ZETIA) 10 MG tablet Take 1 tablet (10 mg total) by mouth daily.  . NONFORMULARY OR COMPOUNDED ITEM Overnight pulse oximetry  . polyethylene glycol (MIRALAX / GLYCOLAX) 17 g packet Take 17 g by mouth daily.  . rosuvastatin (CRESTOR) 40 MG tablet Take 1 tablet (40 mg total) by mouth daily.  Marland Kitchen spironolactone (ALDACTONE) 25 MG tablet Take 1 tablet (25 mg total) by mouth daily.     Allergies:   Patient has no known allergies.   Social History   Tobacco Use  . Smoking status: Former Smoker    Types: Cigarettes    Quit date: 07/03/1977    Years since quitting: 43.1  . Smokeless tobacco: Never Used  Vaping Use  . Vaping Use: Never used  Substance Use Topics  . Alcohol use: Never  . Drug use: Never     Family Hx: The patient's family history includes Rheum arthritis in his brother and mother.  ROS   EKGs/Labs/Other Test Reviewed:    EKG:  EKG is  ordered today.  The ekg ordered today demonstrates normal sinus rhythm, heart 73, normal axis, inferior Q waves, nonspecific ST-T wave changes, QTC 416  Recent Labs: 03/07/2020: Hemoglobin 16.1; Platelets 207 05/16/2020: NT-Pro BNP 488 07/02/2020: ALT 21; BUN 25; Creatinine, Ser 1.30; Potassium 5.0; Sodium 141   Recent Lipid Panel Lab Results  Component Value Date/Time   CHOL 117 07/02/2020 01:03 PM   TRIG 212 (H) 07/02/2020 01:03 PM   HDL 41 07/02/2020 01:03 PM   CHOLHDL 2.9 07/02/2020 01:03 PM   CHOLHDL 3.7 06/27/2019 01:45 PM   LDLCALC 42 07/02/2020 01:03 PM   LDLCALC 101 (H)  06/27/2019 01:45 PM      Risk Assessment/Calculations:      Physical Exam:    VS:  BP 116/60   Pulse 73   Ht _0  (1.803 m)   Wt 202 lb 12.8 oz (92 kg)   SpO2 97%   BMI 28.28 kg/m     Wt Readings from Last 3 Encounters:  08/15/20 202 lb 12.8 oz (92 kg)  05/16/20 203 lb 12.8 oz (92.4 kg)  04/04/20 198 lb 12.8 oz (90.2 kg)     Constitutional:      Appearance: Healthy appearance. Not  in distress.  Neck:     Vascular: JVD normal.  Pulmonary:     Effort: Pulmonary effort is normal.     Breath sounds: No wheezing. No rales.  Cardiovascular:     Normal rate. Regular rhythm. Normal S1. Normal S2.     Murmurs: There is no murmur.  Edema:    Peripheral edema absent.  Abdominal:     Palpations: Abdomen is soft.  Skin:    General: Skin is warm and dry.  Neurological:     General: No focal deficit present.     Mental Status: Alert and oriented to person, place and time.     Cranial Nerves: Cranial nerves are intact.      ASSESSMENT & PLAN:    1. HFrEF (heart failure with reduced ejection fraction) (HCC) EF 35-40.  Ischemic cardiomyopathy.  NYHA II.  He is overall doing well.  Volume status stable.  At this point, I recommend continuing current dose of carvedilol, Entresto, spironolactone.  Follow-up in 6 months.    Dispo:  Return in about 6 months (around 02/15/2021) for Routine Follow Up, w/ Richardson Dopp, PA-C, in person.   Medication Adjustments/Labs and Tests Ordered: Current medicines are reviewed at length with the patient today.  Concerns regarding medicines are outlined above.  Tests Ordered: No orders of the defined types were placed in this encounter.  Medication Changes: No orders of the defined types were placed in this encounter.   Signed, Richardson Dopp, PA-C  08/15/2020 10:27 AM    [facility] [address], [place], [place]  [address] Phone: [phone]; Fax: [phone]

## 2020-08-15 ENCOUNTER — Ambulatory Visit: Payer: Federal, State, Local not specified - PPO | Admitting: Physician Assistant

## 2020-08-15 ENCOUNTER — Encounter: Payer: Self-pay | Admitting: Physician Assistant

## 2020-08-15 ENCOUNTER — Other Ambulatory Visit: Payer: Self-pay

## 2020-08-15 VITALS — BP 116/60 | HR 73 | Ht 71.0 in | Wt 202.8 lb

## 2020-08-15 DIAGNOSIS — I77810 Thoracic aortic ectasia: Secondary | ICD-10-CM

## 2020-08-15 DIAGNOSIS — I2510 Atherosclerotic heart disease of native coronary artery without angina pectoris: Secondary | ICD-10-CM

## 2020-08-15 DIAGNOSIS — I5020 Unspecified systolic (congestive) heart failure: Secondary | ICD-10-CM | POA: Diagnosis not present

## 2020-08-15 DIAGNOSIS — N1831 Chronic kidney disease, stage 3a: Secondary | ICD-10-CM

## 2020-08-15 NOTE — Patient Instructions (Addendum)
Medication Instructions:  Continue your current medications.  *If you need a refill on your cardiac medications before your next appointment, please call your pharmacy*   Follow-Up: At [facility], you and your health needs are our priority.  As part of our continuing mission to provide you with exceptional heart care, we have created designated Provider Care Teams.  These Care Teams include your primary Cardiologist (physician) and Advanced Practice Providers (APPs -  Physician Assistants and Nurse Practitioners) who all work together to provide you with the care you need, when you need it.  We recommend signing up for the patient portal called "MyChart".  Sign up information is provided on this After Visit Summary.  MyChart is used to connect with patients for Virtual Visits (Telemedicine).  Patients are able to view lab/test results, encounter notes, upcoming appointments, etc.  Non-urgent messages can be sent to your provider as well.   To learn more about what you can do with MyChart, go to [email or web address].    Your next appointment:   6 month(s)  The format for your next appointment:   In Person  Provider:   Tereso Newcomer, PA-C   Other Instructions  Your physician wants you to follow-up in: 6 months with Tereso Newcomer, PA.  You will receive a reminder letter in the mail two months in advance. If you don't receive a letter, please call our office to schedule the follow-up appointment.

## 2020-11-04 ENCOUNTER — Other Ambulatory Visit: Payer: Self-pay | Admitting: Physician Assistant

## 2020-11-11 ENCOUNTER — Other Ambulatory Visit: Payer: Self-pay | Admitting: Physician Assistant

## 2021-01-31 ENCOUNTER — Other Ambulatory Visit: Payer: Self-pay | Admitting: Physician Assistant

## 2021-01-31 DIAGNOSIS — I5032 Chronic diastolic (congestive) heart failure: Secondary | ICD-10-CM

## 2021-02-25 ENCOUNTER — Other Ambulatory Visit: Payer: Self-pay | Admitting: Physician Assistant

## 2021-02-25 DIAGNOSIS — I5032 Chronic diastolic (congestive) heart failure: Secondary | ICD-10-CM

## 2021-04-16 ENCOUNTER — Other Ambulatory Visit: Payer: Self-pay | Admitting: Physician Assistant

## 2021-05-07 ENCOUNTER — Other Ambulatory Visit: Payer: Self-pay | Admitting: Physician Assistant

## 2021-05-31 ENCOUNTER — Other Ambulatory Visit: Payer: Self-pay | Admitting: Physician Assistant

## 2021-05-31 DIAGNOSIS — I5032 Chronic diastolic (congestive) heart failure: Secondary | ICD-10-CM

## 2021-07-06 ENCOUNTER — Other Ambulatory Visit: Payer: Self-pay | Admitting: Physician Assistant

## 2021-07-16 ENCOUNTER — Encounter: Payer: Self-pay | Admitting: *Deleted

## 2021-08-06 ENCOUNTER — Other Ambulatory Visit: Payer: Self-pay | Admitting: Physician Assistant

## 2021-08-27 ENCOUNTER — Other Ambulatory Visit: Payer: Self-pay | Admitting: Cardiovascular Disease

## 2021-08-27 DIAGNOSIS — I5032 Chronic diastolic (congestive) heart failure: Secondary | ICD-10-CM

## 2021-09-12 DIAGNOSIS — N1831 Chronic kidney disease, stage 3a: Secondary | ICD-10-CM | POA: Insufficient documentation

## 2021-09-12 DIAGNOSIS — I77810 Thoracic aortic ectasia: Secondary | ICD-10-CM | POA: Insufficient documentation

## 2021-09-12 DIAGNOSIS — I5020 Unspecified systolic (congestive) heart failure: Secondary | ICD-10-CM | POA: Insufficient documentation

## 2021-09-12 NOTE — Progress Notes (Signed)
?Cardiology Office Note:   ? ?Date:  09/13/2021  ? ?ID:  [name], DOB 01-07-1941, MRN [id number] ? ?PCP:  Patient, No Pcp Per (Inactive)  ?[facility] Providers ?Cardiologist:  Sherren Mocha, MD ?Cardiology APP:  Liliane Shi, PA-C    ?Referring MD: No ref. provider found  ? ?Chief Complaint:  Follow-up for CAD, CHF ?  ? ?Patient Profile: ?Coronary artery disease  ?S/p inf STEMI in 10/19 tx with POBA of RCA then emergent CABG ?Post op AFib managed w/ Amiodarone ?Cath 10/21: 3/3 grafts patent; med Rx  ?Heart failure with reduced ejection fraction  ?EF 35-45 at time of MI >> improved to normal ?Echocardiogram 10/21: EF 25-30, Gr 1 DD ?Echocardiogram 3/22: EF 35-40 ?Chronic kidney disease  ?Hyperlipidemia  ?Dilated aortic root ?45 mm on echocardiogram 3/22 ?CT 3/22: 39 mm; SoV 40 mm ?  ?Prior CV studies: ?Chest CTA 07/27/20 ?Ascending Ao 39 mm; SoV 40 mm  ?  ?Echocardiogram 07/24/20 ?Inf HK, ant-sept, inf-sept HK, sept AK, EF 35-40, mild LVH, Gr 1 DD, mild MR, Ao root 45 mm ?  ?Cardiac catheterization 03/14/20 ?Severe native 3v CAD ?L-LAD patent ?Free RIMA-OM patent ?S-PDA patent ?  ?Echocardiogram 02/29/20 ?Inf-sept AK, no LV thrombus, EF 25-30, Gr 1 DD, mildly reduced RVSF, RVSP 20.3, mild to mod MR, AV sclerosis (no AS), mild dilation of aortic root (42 mm) ?  ?Myoview 02/13/20 ?EF 33, inf-lat and inf AK, inf-lat and apical lat infarct, no peri-infarct ischemia ?  ?Echo 03/31/18 ?Mild LVH, inf AK, EF 50, Gr 2 DD, Asc aorta 39 mm (mildly dilated), mild MAC, trivial MR, mild LAE, mild dilated RV, normal RVSF, mild RAE, trivial effusion ?  ?Echo 03/23/18 ?Mod conc LVH, EF 50-55, inf AK, normal diastolic function, Asc aorta 40 mm, mild RV dilation  ?  ?Cardiac Catheterization 03/22/18 ?Conclusions: ?Severe three-vessel CAD, as detailed below.  Culprit lesion is acute plaque rupture and thrombotic subtotal occlusion of the mid RCA with TIMI-1 flow.David [name] ?Moderately elevated left ventricular filling pressure with LVEF  of 35 to 45%.  There is inferior akinesis. ?Successful PTCA of the mid RCA using a 2.5 x 15 mm balloon with reduction of stenosis from 99% to 30% and restoration of TIMI-3 flow. ? ? ?History of Present Illness:   ?David [name] is a 81 y.o. male with the above problem list.  He was last seen in march 2022.  He returns for f/u.  He is here alone.  Overall, he is doing well without chest pain, shortness of breath, syncope, orthopnea, leg edema.     ?   ?Past Medical History:  ?Diagnosis Date  ? Aortic root enlargement ([place])   ? 45 mm on echo >> Chest CTA: SoV 40 mm, ascending aorta 39 mm  ? CAD (coronary artery disease) 03/22/2018  ? S/p Inf STEMI 10/19 >> Tx with POBA to RCA then emergent CABG (LIMA to LAD, Free RIMA to OM, and SVG to PDA) with Dr. Roxy Manns // Myoview 9/21: EF 33, inferolateral and apical lateral infarct, no ischemia; high risk   ? Echocardiogram 02/2020   ? Echo 10/21: EF 25-30, inferoseptal AK, GR 1 DD, mildly reduced RVSF, RVSP 20.3 mmHg, mild-moderate MR, moderate AV calcification with mild aortic valve sclerosis (no AS), dilated aortic root (42 mm)  ? HFrEF (heart failure with reduced ejection fraction) ([place]) 04/08/2018  ? Post CABG volume overload req'd Lasix gtt // complicated by AKI // Echo 03/31/18:  Mild LVH, inf AK, EF 50, Gr 2  DD, Asc aorta 39 mm (mildly dilated), mild MAC, trivial MR, mild LAE, mild dilated RV, normal RVSF, mild RAE, trivial effusion // Echo 03/23/18: Mod conc LVH, EF 50-55, inf AK, normal diastolic function, Asc aorta 40 mm, mild RV dilation // Echo 10/21: EF 25-30 // Echo 3/22:  EF 35-40  ? Hyperlipidemia 04/08/2018  ? Postoperative atrial fibrillation (HCC)   ? Tx with Amiodarone  ? ?Current Medications: ?Current Meds  ?Medication Sig  ? aspirin EC 81 MG EC tablet Take 1 tablet (81 mg total) by mouth daily.  ? empagliflozin (JARDIANCE) 10 MG TABS tablet Take 1 tablet (10 mg total) by mouth daily before breakfast.  ? NONFORMULARY OR COMPOUNDED ITEM Overnight pulse  oximetry  ? polyethylene glycol (MIRALAX / GLYCOLAX) 17 g packet Take 17 g by mouth daily.  ? [DISCONTINUED] carvedilol (COREG) 3.125 MG tablet TAKE 1 TABLET BY MOUTH TWICE A DAY WITH MEALS  ? [DISCONTINUED] ENTRESTO 24-26 MG Take 1 tablet by mouth 2 (two) times daily.  ? [DISCONTINUED] ezetimibe (ZETIA) 10 MG tablet TAKE 1 TABLET BY MOUTH EVERY DAY  ? [DISCONTINUED] rosuvastatin (CRESTOR) 40 MG tablet TAKE 1 TABLET BY MOUTH EVERY DAY  ? [DISCONTINUED] spironolactone (ALDACTONE) 25 MG tablet Take 1 tablet (25 mg total) by mouth daily. Please make yearly appt with Dr. Burt Knack for March 2023 for future refills. Thank you 1st attempt  ?  ?Allergies:   Patient has no known allergies.  ? ?Social History  ? ?Tobacco Use  ? Smoking status: Former  ?  Types: Cigarettes  ?  Quit date: 07/03/1977  ?  Years since quitting: 44.2  ? Smokeless tobacco: Never  ?Vaping Use  ? Vaping Use: Never used  ?Substance Use Topics  ? Alcohol use: Never  ? Drug use: Never  ?  ?Family Hx: ?The patient's family history includes Rheum arthritis in his brother and mother. ? ?Review of Systems  ?Gastrointestinal:  Negative for hematochezia.  ?Genitourinary:  Negative for hematuria.   ? ?EKGs/Labs/Other Test Reviewed:   ? ?EKG:  EKG is  ordered today.  The ekg ordered today demonstrates NSR, HR 69, inferior Q waves, PVC, QTc 417, similar to prior tracings ? ?Recent Labs: ?No results found for requested labs within last 8760 hours.  ? ?Recent Lipid Panel ?No results for input(s): CHOL, TRIG, HDL, VLDL, LDLCALC, LDLDIRECT in the last 8760 hours.  ? ?Risk Assessment/Calculations:   ?  ?    ?Physical Exam:   ? ?VS:  BP 124/78   Pulse 69   Ht 6' (1.829 m)   Wt 204 lb 3.2 oz (92.6 kg)   SpO2 95%   BMI 27.69 kg/m?    ? ?Wt Readings from Last 3 Encounters:  ?09/13/21 204 lb 3.2 oz (92.6 kg)  ?08/15/20 202 lb 12.8 oz (92 kg)  ?05/16/20 203 lb 12.8 oz (92.4 kg)  ?  ?Constitutional:   ?   Appearance: Healthy appearance. Not in distress.  ?Neck:  ?    Vascular: No JVR. JVD normal.  ?Pulmonary:  ?   Effort: Pulmonary effort is normal.  ?   Breath sounds: No wheezing. No rales.  ?Cardiovascular:  ?   Normal rate. Regular rhythm. Normal S1. Normal S2.   ?   Murmurs: There is no murmur.  ?Edema: ?   Peripheral edema absent.  ?Abdominal:  ?   Palpations: Abdomen is soft.  ?Skin: ?   General: Skin is warm and dry.  ?Neurological:  ?   General: No  focal deficit present.  ?   Mental Status: Alert and oriented to person, place and time.  ?   Cranial Nerves: Cranial nerves are intact.  ?  ?    ?ASSESSMENT & PLAN:   ?HFrEF (heart failure with reduced ejection fraction) ([place]) ?EF 35-40 by echocardiogram March 2022.  NYHA II.  Volume status stable.  He has not been tried on SGLT2 inhibitor in the past.  I think it is reasonable to try this now.  Start Jardiance 10 mg daily.  Obtain follow-up CMET in 2 weeks with a nurse visit to check blood pressure and assess for any side effects.  Continue Coreg 3.125 mg twice daily, Entresto 24/26 mg twice daily, spironolactone 25 mg daily.  Follow-up with me or Dr. Burt Knack in 1 year. ? ?Coronary artery disease involving native coronary artery of native heart without angina pectoris ?History of inferior STEMI in 2019 followed by CABG.  He is doing well without anginal symptoms.  Continue aspirin 81 mg daily, Coreg 3.125 mg twice daily, Crestor 40 mg daily, Zetia 10 mg daily. ? ?Mixed hyperlipidemia ?Continue Crestor 40 mg daily, Zetia 10 mg daily.  Arrange fasting CMET, lipids. ? ?Stage 3a chronic kidney disease ([place]) ?eGFR 53.  Repeat CMET 2 weeks after starting Jardiance.  ? ?     ?   ?Dispo:  Return in about 1 year (around 09/14/2022) for Routine Follow Up, w/ Dr. Burt Knack, or Richardson Dopp, PA-C.  ? ?Medication Adjustments/Labs and Tests Ordered: ?Current medicines are reviewed at length with the patient today.  Concerns regarding medicines are outlined above.  ?Tests Ordered: ?Orders Placed This Encounter  ?Procedures  ? Comp Met (CMET)  ?  Lipid panel  ? EKG 12-Lead  ? ?Medication Changes: ?Meds ordered this encounter  ?Medications  ? ENTRESTO 24-26 MG  ?  Sig: Take 1 tablet by mouth 2 (two) times daily.  ?  Dispense:  180 tablet  ?  Refill:  3  ? ezetim

## 2021-09-13 ENCOUNTER — Encounter: Payer: Self-pay | Admitting: Physician Assistant

## 2021-09-13 ENCOUNTER — Ambulatory Visit: Payer: Federal, State, Local not specified - PPO | Admitting: Physician Assistant

## 2021-09-13 VITALS — BP 124/78 | HR 69 | Ht 72.0 in | Wt 204.2 lb

## 2021-09-13 DIAGNOSIS — E782 Mixed hyperlipidemia: Secondary | ICD-10-CM

## 2021-09-13 DIAGNOSIS — I5020 Unspecified systolic (congestive) heart failure: Secondary | ICD-10-CM | POA: Diagnosis not present

## 2021-09-13 DIAGNOSIS — I2510 Atherosclerotic heart disease of native coronary artery without angina pectoris: Secondary | ICD-10-CM

## 2021-09-13 DIAGNOSIS — N1831 Chronic kidney disease, stage 3a: Secondary | ICD-10-CM

## 2021-09-13 DIAGNOSIS — I77810 Thoracic aortic ectasia: Secondary | ICD-10-CM

## 2021-09-13 MED ORDER — EMPAGLIFLOZIN 10 MG PO TABS: 10.0000 mg | ORAL_TABLET | Freq: Every day | ORAL | 3 refills | Status: DC

## 2021-09-13 MED ORDER — EZETIMIBE 10 MG PO TABS: 10.0000 mg | ORAL_TABLET | Freq: Every day | ORAL | 3 refills | Status: DC

## 2021-09-13 MED ORDER — SPIRONOLACTONE 25 MG PO TABS: 25.0000 mg | ORAL_TABLET | Freq: Every day | ORAL | 3 refills | Status: DC

## 2021-09-13 MED ORDER — ROSUVASTATIN CALCIUM 40 MG PO TABS: 40.0000 mg | ORAL_TABLET | Freq: Every day | ORAL | 3 refills | Status: DC

## 2021-09-13 MED ORDER — CARVEDILOL 3.125 MG PO TABS: 3.1250 mg | ORAL_TABLET | Freq: Two times a day (BID) | ORAL | 3 refills | Status: DC

## 2021-09-13 MED ORDER — ENTRESTO 24-26 MG PO TABS: 1.0000 | ORAL_TABLET | Freq: Two times a day (BID) | ORAL | 3 refills | Status: DC

## 2021-09-13 NOTE — Assessment & Plan Note (Signed)
eGFR 53.  Repeat CMET 2 weeks after starting Jardiance.  ?

## 2021-09-13 NOTE — Assessment & Plan Note (Signed)
History of inferior STEMI in 2019 followed by CABG.  He is doing well without anginal symptoms.  Continue aspirin 81 mg daily, Coreg 3.125 mg twice daily, Crestor 40 mg daily, Zetia 10 mg daily. ?

## 2021-09-13 NOTE — Patient Instructions (Addendum)
Medication Instructions:  ?Your physician has recommended you make the following change in your medication:  ? START Jardiance 10 mg taking 1 daily  ? ? ?*If you need a refill on your cardiac medications before your next appointment, please call your pharmacy* ? ? ?Lab Work: ?09/27/21 COME FASTING FOR:  CMET & LIPID ? ?If you have labs (blood work) drawn today and your tests are completely normal, you will receive your results only by: ?MyChart Message (if you have MyChart) OR ?A paper copy in the mail ?If you have any lab test that is abnormal or we need to change your treatment, we will call you to review the results. ? ? ?Testing/Procedures: ?None ordered ? ?Your physician recommends that you schedule a follow-up appointment in: 09/27/21 ARRIVE AT 10:45, MAKE SURE YOU COME FASTING FOR LABS AND YOU WILL ALSO SEE A NURSE TO CHECK YOUR BLOOD PRESSURE AND MAKE SURE YOU ARE DOING OK ON THE JARDIANCE  ? ?Follow-Up: ?At [facility], you and your health needs are our priority.  As part of our continuing mission to provide you with exceptional heart care, we have created designated Provider Care Teams.  These Care Teams include your primary Cardiologist (physician) and Advanced Practice Providers (APPs -  Physician Assistants and Nurse Practitioners) who all work together to provide you with the care you need, when you need it. ? ?We recommend signing up for the patient portal called "MyChart".  Sign up information is provided on this After Visit Summary.  MyChart is used to connect with patients for Virtual Visits (Telemedicine).  Patients are able to view lab/test results, encounter notes, upcoming appointments, etc.  Non-urgent messages can be sent to your provider as well.   ?To learn more about what you can do with MyChart, go to [email or web address].   ? ?Your next appointment:   ?12 month(s) ? ?The format for your next appointment:   ?In Person ? ?Provider:   ?Tonny Bollman, MD  or Tereso Newcomer, PA-C        ? ? ?Other Instructions ? ? ?Important Information About Sugar ? ? ? ? ? ?

## 2021-09-13 NOTE — Assessment & Plan Note (Signed)
Continue Crestor 40 mg daily, Zetia 10 mg daily.  Arrange fasting CMET, lipids. ?

## 2021-09-13 NOTE — Assessment & Plan Note (Signed)
EF 35-40 by echocardiogram March 2022.  NYHA II.  Volume status stable.  He has not been tried on SGLT2 inhibitor in the past.  I think it is reasonable to try this now.  Start Jardiance 10 mg daily.  Obtain follow-up CMET in 2 weeks with a nurse visit to check blood pressure and assess for any side effects.  Continue Coreg 3.125 mg twice daily, Entresto 24/26 mg twice daily, spironolactone 25 mg daily.  Follow-up with me or Dr. Excell Seltzer in 1 year. ?

## 2021-09-20 ENCOUNTER — Ambulatory Visit: Payer: Federal, State, Local not specified - PPO | Admitting: Physician Assistant

## 2021-09-23 ENCOUNTER — Telehealth: Payer: Self-pay

## 2021-09-23 NOTE — Telephone Encounter (Signed)
**Note De-Identified  Obfuscation** BCBS ?[facility] Clinical Call Center ?Fax Transmittal ?Phone: [phone] Fax: [phone] ?Date: 09/23/2021 ?To: Dr. Excell Seltzer Fax #: [phone] ?From: [facility] Clinical Call Center ?Re: David [name], ?January 11, 1941, ? ?Message: ?The Prior Authorization request has been approved for Jardiance 10MG  OR TABS. ?The authorization is valid from 08/24/2021 through 09/23/2022. A letter of explanation will also be ?mailed to the patient. ? ?I have notified [phone] - [place],  - [address] (Ph: [phone]) of this approval. ? ?

## 2021-09-27 ENCOUNTER — Ambulatory Visit (INDEPENDENT_AMBULATORY_CARE_PROVIDER_SITE_OTHER): Payer: Federal, State, Local not specified - PPO

## 2021-09-27 ENCOUNTER — Other Ambulatory Visit: Payer: Federal, State, Local not specified - PPO | Admitting: *Deleted

## 2021-09-27 VITALS — BP 122/72 | HR 62 | Ht 72.0 in | Wt 205.0 lb

## 2021-09-27 DIAGNOSIS — I10 Essential (primary) hypertension: Secondary | ICD-10-CM

## 2021-09-27 DIAGNOSIS — I2510 Atherosclerotic heart disease of native coronary artery without angina pectoris: Secondary | ICD-10-CM

## 2021-09-27 DIAGNOSIS — I5020 Unspecified systolic (congestive) heart failure: Secondary | ICD-10-CM

## 2021-09-27 DIAGNOSIS — N1831 Chronic kidney disease, stage 3a: Secondary | ICD-10-CM

## 2021-09-27 DIAGNOSIS — E782 Mixed hyperlipidemia: Secondary | ICD-10-CM

## 2021-09-27 LAB — LIPID PANEL
Chol/HDL Ratio: 2.7 ratio (ref 0.0–5.0)
Cholesterol, Total: 109 mg/dL (ref 100–199)
HDL: 40 mg/dL (ref >39)
LDL Chol Calc (NIH): 47 mg/dL (ref 0–99)
Triglycerides: 120 mg/dL (ref 0–149)
VLDL Cholesterol Cal: 22 mg/dL (ref 5–40)

## 2021-09-27 LAB — COMPREHENSIVE METABOLIC PANEL
ALT: 29 IU/L (ref 0–44)
AST: 18 IU/L (ref 0–40)
Albumin/Globulin Ratio: 1.6 (ref 1.2–2.2)
Albumin: 4.5 g/dL (ref 3.7–4.7)
Alkaline Phosphatase: 75 IU/L (ref 44–121)
BUN/Creatinine Ratio: 18 (ref 10–24)
BUN: 22 mg/dL (ref 8–27)
Bilirubin Total: 0.7 mg/dL (ref 0.0–1.2)
CO2: 21 mmol/L (ref 20–29)
Calcium: 9.3 mg/dL (ref 8.6–10.2)
Chloride: 104 mmol/L (ref 96–106)
Creatinine, Ser: 1.23 mg/dL (ref 0.76–1.27)
Globulin, Total: 2.8 g/dL (ref 1.5–4.5)
Glucose: 95 mg/dL (ref 70–99)
Potassium: 5.3 mmol/L — ABNORMAL HIGH (ref 3.5–5.2)
Sodium: 137 mmol/L (ref 134–144)
Total Protein: 7.3 g/dL (ref 6.0–8.5)
eGFR: 60 mL/min/{1.73_m2} (ref >59)

## 2021-09-27 NOTE — Progress Notes (Signed)
? ?  Nurse Visit  ? ?Date of Encounter: 09/27/2021 ?ID: Kamarian Sahakian, DOB [birth year], MRN [id number] ? ?PCP:  Patient, No Pcp Per (Inactive) ?  ?[facility] Providers ?Cardiologist:  Tonny Bollman, MD ?Cardiology APP:  Beatrice Lecher, PA-C { ? ? ?Visit Details  ? ?VS:  BP 122/72   Pulse 62   Ht 6' (1.829 m)   Wt 205 lb (93 kg)   SpO2 97%   BMI 27.80 kg/m?  , BMI Body mass index is 27.8 kg/m?. ? ?Wt Readings from Last 3 Encounters:  ?09/27/21 205 lb (93 kg)  ?09/13/21 204 lb 3.2 oz (92.6 kg)  ?08/15/20 202 lb 12.8 oz (92 kg)  ?  ? ?Reason for visit: BP check after starting jardiance  ?Performed today: Vitals, Provider consulted: Tereso Newcomer, PA/Dr. Lynnette Caffey (DOD), and Education ?Changes (medications, testing, etc.) : NONE (labs already ordered for today) ?Length of Visit: 15 minutes ? ? ? ?Medications Adjustments/Labs and Tests Ordered: ?Patient presents to clinic for Nurse visit BP check after starting jardiance 10 mg QD. Patient denies having any Sx or complaints and states that he is doing well. BP stable. Continue with current plan to follow up in 1 year. Patient made aware that he will be contacted with his lab results and any new recommendations.  ? ? ? ?Signed, ?Lattie Haw, RN  ?09/27/2021 11:04 AM ? ?

## 2021-09-27 NOTE — Patient Instructions (Signed)
Medication Instructions:  ?Your physician recommends that you continue on your current medications as directed. Please refer to the Current Medication list given to you today. ? ?*If you need a refill on your cardiac medications before your next appointment, please call your pharmacy* ? ? ?Lab Work: ?TODAY: CMET, LIPIDS (already ordered) ? ?If you have labs (blood work) drawn today and your tests are completely normal, you will receive your results only by: ?MyChart Message (if you have MyChart) OR ?A paper copy in the mail ?If you have any lab test that is abnormal or we need to change your treatment, we will call you to review the results. ? ? ?Testing/Procedures: ?None ? ?Follow-Up: ?As planned :1}  ? ?Important Information About Sugar ? ? ? ? ? ? ?

## 2021-09-30 ENCOUNTER — Telehealth: Payer: Self-pay | Admitting: *Deleted

## 2021-09-30 DIAGNOSIS — Z79899 Other long term (current) drug therapy: Secondary | ICD-10-CM

## 2021-09-30 MED ORDER — SPIRONOLACTONE 25 MG PO TABS: 12.5000 mg | ORAL_TABLET | Freq: Every day | ORAL | 3 refills | Status: DC

## 2021-09-30 NOTE — Telephone Encounter (Signed)
-----   Message from Beatrice Lecher, [place] sent at 09/28/2021  2:05 PM EDT ----- ?Creatinine stable.  K+ slightly elevated.  LFTs normal.  Lipids optimal. ?PLAN:  ?-Decrease spironolactone to 12.5 mg daily ?-Repeat BMET 2 weeks ?-Continue all other medications the same ?Tereso Newcomer, PA-C    ?09/28/2021 2:04 PM   ?

## 2021-10-11 ENCOUNTER — Other Ambulatory Visit (INDEPENDENT_AMBULATORY_CARE_PROVIDER_SITE_OTHER): Payer: Federal, State, Local not specified - PPO | Admitting: *Deleted

## 2021-10-11 DIAGNOSIS — Z79899 Other long term (current) drug therapy: Secondary | ICD-10-CM

## 2021-10-11 LAB — BASIC METABOLIC PANEL
BUN/Creatinine Ratio: 27 — ABNORMAL HIGH (ref 10–24)
BUN: 38 mg/dL — ABNORMAL HIGH (ref 8–27)
CO2: 17 mmol/L — ABNORMAL LOW (ref 20–29)
Calcium: 8.7 mg/dL (ref 8.6–10.2)
Chloride: 108 mmol/L — ABNORMAL HIGH (ref 96–106)
Creatinine, Ser: 1.42 mg/dL — ABNORMAL HIGH (ref 0.76–1.27)
Glucose: 127 mg/dL — ABNORMAL HIGH (ref 70–99)
Potassium: 4.9 mmol/L (ref 3.5–5.2)
Sodium: 140 mmol/L (ref 134–144)
eGFR: 50 mL/min/{1.73_m2} — ABNORMAL LOW (ref >59)

## 2021-10-15 ENCOUNTER — Telehealth: Payer: Self-pay | Admitting: *Deleted

## 2021-10-15 DIAGNOSIS — Z79899 Other long term (current) drug therapy: Secondary | ICD-10-CM

## 2021-10-15 NOTE — Telephone Encounter (Signed)
-----   Message from Beatrice Lecher, [place] sent at 10/13/2021 10:50 PM EDT ----- Creatinine increased since last lab but stable compared to historical values.  BUN elevated.  K+ normal.   PLAN:  -DC Spironolactone  -Repeat BMET in 1 week. Tereso Newcomer, PA-C    10/13/2021 10:45 PM

## 2021-10-22 ENCOUNTER — Other Ambulatory Visit: Payer: Federal, State, Local not specified - PPO | Admitting: *Deleted

## 2021-10-22 DIAGNOSIS — Z79899 Other long term (current) drug therapy: Secondary | ICD-10-CM

## 2021-10-22 LAB — BASIC METABOLIC PANEL
BUN/Creatinine Ratio: 16 (ref 10–24)
BUN: 20 mg/dL (ref 8–27)
CO2: 20 mmol/L (ref 20–29)
Calcium: 9.2 mg/dL (ref 8.6–10.2)
Chloride: 106 mmol/L (ref 96–106)
Creatinine, Ser: 1.25 mg/dL (ref 0.76–1.27)
Glucose: 96 mg/dL (ref 70–99)
Potassium: 5.1 mmol/L (ref 3.5–5.2)
Sodium: 141 mmol/L (ref 134–144)
eGFR: 59 mL/min/{1.73_m2} — ABNORMAL LOW (ref >59)

## 2021-10-23 NOTE — Progress Notes (Signed)
Pt's daughter, Eber Jones, [place] on file, has been made aware of normal result and verbalized understanding.  jw

## 2022-02-27 ENCOUNTER — Telehealth: Payer: Self-pay

## 2022-02-27 NOTE — Telephone Encounter (Signed)
**Note De-Identified  Obfuscation** We received a Entresto request from [place]. I did the PA and received the following message: HADRIEL NORTHUP Key: [id number] Outcome: [facility] has indicated that it is too soon to refill this medication at the pharmacy for your patient. Drug Entresto 24-26MG  tablets Form [facility] Electronic PA Form [phone] NCPDP)  PA not needed and [facility] has been made aware.

## 2022-08-04 ENCOUNTER — Telehealth: Payer: Self-pay | Admitting: Cardiovascular Disease

## 2022-08-04 NOTE — Telephone Encounter (Signed)
Pt c/o medication issue:  1. Name of Medication: empagliflozin (JARDIANCE) 10 MG TABS tablet   2. How are you currently taking this medication (dosage and times per day)?   Take 1 tablet (10 mg total) by mouth daily before breakfast.    3. Are you having a reaction (difficulty breathing--STAT)? no  4. What is your medication issue? States that he needs pre approval for this medication, with his new insurance\. Please advise

## 2022-08-05 NOTE — Telephone Encounter (Signed)
**Note De-Identified  Obfuscation** David [name] (KeyRG:[id number]) Outcome [facility] has indicated that it is too soon to refill this medication at the pharmacy for your patient.  Drug Jardiance '10MG'$  tablets ePA cloud Child psychotherapist Electronic PA Form [phone] NCPDP)  [id number] [place], [place] - [address](Ph: [phone] and was advised that the pt picked up a 3 month refill on 2/9 and cannot get another refill until May. I called the pt and made him aware of this and he stated that his insurance sent him a letter stating that a Jardiance PA is required and that he is not in need of a refill at this time. He is aware to call [place] he has any issues getting his Jardiance refills. He thanked me for calling him back to explain.

## 2022-09-30 ENCOUNTER — Other Ambulatory Visit: Payer: Self-pay | Admitting: Physician Assistant

## 2022-10-14 ENCOUNTER — Other Ambulatory Visit (HOSPITAL_COMMUNITY): Payer: Self-pay

## 2022-10-14 ENCOUNTER — Telehealth: Payer: Self-pay

## 2022-10-14 NOTE — Telephone Encounter (Signed)
Pharmacy Patient Advocate Encounter   Received notification from [facility] that prior authorization for JARDIANCE is needed.    PA submitted on 10/14/22 Key [id number] Status is pending  Haze Rushing, CPhT Pharmacy Patient Advocate Specialist Direct Number: [phone] Fax: [phone]

## 2022-10-14 NOTE — Telephone Encounter (Signed)
Pharmacy Patient Advocate Encounter  Prior Authorization for [facility] has been approved.    PA# [id number] Effective dates: 09/14/22 through 10/14/22  Haze Rushing, CPhT Pharmacy Patient Advocate Specialist Direct Number: [phone] Fax: [phone]

## 2022-11-21 ENCOUNTER — Other Ambulatory Visit: Payer: Self-pay | Admitting: Physician Assistant

## 2022-11-26 ENCOUNTER — Encounter: Payer: Self-pay | Admitting: Physician Assistant

## 2022-11-26 ENCOUNTER — Ambulatory Visit: Payer: Federal, State, Local not specified - PPO | Attending: Physician Assistant | Admitting: Physician Assistant

## 2022-11-26 VITALS — BP 126/58 | HR 65 | Ht 72.0 in | Wt 207.0 lb

## 2022-11-26 DIAGNOSIS — N182 Chronic kidney disease, stage 2 (mild): Secondary | ICD-10-CM | POA: Diagnosis not present

## 2022-11-26 DIAGNOSIS — I5020 Unspecified systolic (congestive) heart failure: Secondary | ICD-10-CM | POA: Diagnosis not present

## 2022-11-26 DIAGNOSIS — E782 Mixed hyperlipidemia: Secondary | ICD-10-CM | POA: Diagnosis not present

## 2022-11-26 DIAGNOSIS — I2510 Atherosclerotic heart disease of native coronary artery without angina pectoris: Secondary | ICD-10-CM | POA: Diagnosis not present

## 2022-11-26 NOTE — Assessment & Plan Note (Signed)
He is not fasting today.  Continue Crestor 40 mg daily, Zetia 10 mg daily.  Obtain CMET, direct LDL.  Goal LDL <55.

## 2022-11-26 NOTE — Assessment & Plan Note (Signed)
Obtain follow-up CMET today. 

## 2022-11-26 NOTE — Progress Notes (Signed)
Cardiology Office Note:    Date:  11/26/2022  ID:  David [name], DOB 07/08/1940, MRN [id number] PCP: Patient, No Pcp Per  [facility] Providers Cardiologist:  Tonny Bollman, MD Cardiology APP:  Beatrice Lecher, PA-C       Patient Profile:      Coronary artery disease  S/p inf STEMI in 10/19 tx with POBA of RCA then emergent CABG Post op AFib managed w/ Amiodarone Myoview 02/13/2020: EF 33, inf-lat and inf AK, inf-lat and apical lat infarct, no peri-infarct ischemia  Cath 10/21: 3/3 grafts patent; med Rx  Heart failure with reduced ejection fraction  EF 35-45 at time of MI >> improved to normal Echocardiogram 10/21: EF 25-30, Gr 1 DD TTE 07/24/2020: Inf HK, ant-sept, inf-sept HK, sept AK, EF 35-40, mild LVH, Gr 1 DD, mild MR, Ao root 45 mm  Chronic kidney disease  Hyperlipidemia  Dilated aortic root 45 mm on echocardiogram 3/22 CT 3/22: 39 mm; SoV 40 mm >> no further surveillance needed      History of Present Illness:   David [name] is a 82 y.o. male who returns for follow-up of CAD, CHF.  He was last seen in April 2023.  He was started on Jardiance at that time.  He is here alone.  He is doing well without chest pain, significant shortness of breath, orthopnea, leg edema, syncope.  He does not smoke.  Review of Systems  Gastrointestinal:  Negative for hematochezia and melena.  Genitourinary:  Negative for hematuria.  See the HPI    Studies Reviewed:   EKG Interpretation Date/Time:  Wednesday November 26 2022 15:53:51 EDT Ventricular Rate:  65 PR Interval:  204 QRS Duration:  96 QT Interval:  412 QTC Calculation: 428 R Axis:   37  Text Interpretation: Normal sinus rhythm Inferior Q waves noted TW inversions in V5-V6 Similar to multiple old ECGs Confirmed by Tereso Newcomer [id number]) on 11/26/2022 4:02:25 PM    Risk Assessment/Calculations:             Physical Exam:   VS:  BP (!) 126/58   Pulse 65   Ht 6' (1.829 m)   Wt 207 lb (93.9 kg)   SpO2 95%   BMI 28.07  kg/m    Wt Readings from Last 3 Encounters:  11/26/22 207 lb (93.9 kg)  09/27/21 205 lb (93 kg)  09/13/21 204 lb 3.2 oz (92.6 kg)    Constitutional:      Appearance: Healthy appearance. Not in distress.  Neck:     Vascular: No carotid bruit. JVD normal.  Pulmonary:     Breath sounds: Normal breath sounds. No wheezing. No rales.  Cardiovascular:     Normal rate. Regular rhythm.     Murmurs: There is no murmur.  Edema:    Peripheral edema absent.  Abdominal:     Palpations: Abdomen is soft.      ASSESSMENT AND PLAN:   HFrEF (heart failure with reduced ejection fraction) (HCC) EF 35-40.  NYHA II.  Volume status stable.  He had worsening renal function with spironolactone.  Therefore, that was discontinued.  Continue Coreg 3.125 mg twice daily, Jardiance 10 mg daily, Entresto 24/26 mg twice daily.  Follow-up 1 year.  Coronary artery disease involving native coronary artery of native heart without angina pectoris History of inferior STEMI in 2019 followed by CABG. He is doing well without chest pain to suggest angina.  Continue ASA 81 mg daily, Coreg 3.125 mg twice daily, Crestor 40  mg daily, Zetia 10 mg daily.  Obtain follow-up CMET, CBC.  Follow-up 1 year.  Mixed hyperlipidemia He is not fasting today.  Continue Crestor 40 mg daily, Zetia 10 mg daily.  Obtain CMET, direct LDL.  Goal LDL <55.  CKD (chronic kidney disease) stage 2, GFR 60-89 ml/min Obtain follow-up CMET today.    Dispo:  Return in about 1 year (around 11/26/2023) for Routine Follow Up, w/ Tereso Newcomer, PA-C.  Signed, Tereso Newcomer, PA-C

## 2022-11-26 NOTE — Assessment & Plan Note (Signed)
History of inferior STEMI in 2019 followed by CABG. He is doing well without chest pain to suggest angina.  Continue ASA 81 mg daily, Coreg 3.125 mg twice daily, Crestor 40 mg daily, Zetia 10 mg daily.  Obtain follow-up CMET, CBC.  Follow-up 1 year.

## 2022-11-26 NOTE — Assessment & Plan Note (Signed)
EF 35-40.  NYHA II.  Volume status stable.  He had worsening renal function with spironolactone.  Therefore, that was discontinued.  Continue Coreg 3.125 mg twice daily, Jardiance 10 mg daily, Entresto 24/26 mg twice daily.  Follow-up 1 year.

## 2022-11-26 NOTE — Patient Instructions (Signed)
Medication Instructions:  Your physician recommends that you continue on your current medications as directed. Please refer to the Current Medication list given to you today.  *If you need a refill on your cardiac medications before your next appointment, please call your pharmacy*   Lab Work: TODAY:  CMET, CBC, & DIRECT LDL  If you have labs (blood work) drawn today and your tests are completely normal, you will receive your results only by: MyChart Message (if you have MyChart) OR A paper copy in the mail If you have any lab test that is abnormal or we need to change your treatment, we will call you to review the results.   Testing/Procedures: None ordered   Follow-Up: At [facility], you and your health needs are our priority.  As part of our continuing mission to provide you with exceptional heart care, we have created designated Provider Care Teams.  These Care Teams include your primary Cardiologist (physician) and Advanced Practice Providers (APPs -  Physician Assistants and Nurse Practitioners) who all work together to provide you with the care you need, when you need it.  We recommend signing up for the patient portal called "MyChart".  Sign up information is provided on this After Visit Summary.  MyChart is used to connect with patients for Virtual Visits (Telemedicine).  Patients are able to view lab/test results, encounter notes, upcoming appointments, etc.  Non-urgent messages can be sent to your provider as well.   To learn more about what you can do with MyChart, go to [email or web address].    Your next appointment:   12 month(s)  Provider:   Tonny Bollman, MD  or Tereso Newcomer, PA-C         Other Instructions

## 2022-11-27 LAB — CBC
Hematocrit: 46.3 % (ref 37.5–51.0)
Hemoglobin: 16.1 g/dL (ref 13.0–17.7)
MCH: 33 pg (ref 26.6–33.0)
MCHC: 34.8 g/dL (ref 31.5–35.7)
MCV: 95 fL (ref 79–97)
Platelets: 177 x10E3/uL (ref 150–450)
RBC: 4.88 x10E6/uL (ref 4.14–5.80)
RDW: 13.1 % (ref 11.6–15.4)
WBC: 8 x10E3/uL (ref 3.4–10.8)

## 2022-11-27 LAB — COMPREHENSIVE METABOLIC PANEL WITH GFR
ALT: 27 IU/L (ref 0–44)
AST: 23 IU/L (ref 0–40)
Albumin: 4.5 g/dL (ref 3.8–4.8)
Alkaline Phosphatase: 77 IU/L (ref 44–121)
BUN/Creatinine Ratio: 19 (ref 10–24)
BUN: 22 mg/dL (ref 8–27)
Bilirubin Total: 0.7 mg/dL (ref 0.0–1.2)
CO2: 24 mmol/L (ref 20–29)
Calcium: 9 mg/dL (ref 8.6–10.2)
Chloride: 106 mmol/L (ref 96–106)
Creatinine, Ser: 1.17 mg/dL (ref 0.76–1.27)
Globulin, Total: 2.6 g/dL (ref 1.5–4.5)
Glucose: 119 mg/dL — ABNORMAL HIGH (ref 70–99)
Potassium: 4.8 mmol/L (ref 3.5–5.2)
Sodium: 143 mmol/L (ref 134–144)
Total Protein: 7.1 g/dL (ref 6.0–8.5)
eGFR: 63 mL/min/1.73

## 2022-11-27 LAB — LDL CHOLESTEROL, DIRECT: LDL Direct: 60 mg/dL (ref 0–99)

## 2022-12-26 ENCOUNTER — Other Ambulatory Visit: Payer: Self-pay | Admitting: Physician Assistant

## 2023-03-01 ENCOUNTER — Other Ambulatory Visit: Payer: Self-pay | Admitting: Cardiovascular Disease

## 2023-03-30 ENCOUNTER — Other Ambulatory Visit: Payer: Self-pay | Admitting: Physician Assistant

## 2023-06-21 ENCOUNTER — Other Ambulatory Visit: Payer: Self-pay | Admitting: Cardiovascular Disease

## 2023-08-16 ENCOUNTER — Other Ambulatory Visit: Payer: Self-pay | Admitting: Physician Assistant

## 2023-08-17 ENCOUNTER — Telehealth: Payer: Self-pay | Admitting: Physician Assistant

## 2023-08-17 MED ORDER — ROSUVASTATIN CALCIUM 40 MG PO TABS: 40.0000 mg | ORAL_TABLET | Freq: Every day | ORAL | 1 refills | Status: DC

## 2023-08-17 MED ORDER — ENTRESTO 24-26 MG PO TABS: 1.0000 | ORAL_TABLET | Freq: Two times a day (BID) | ORAL | 1 refills | Status: DC

## 2023-08-17 MED ORDER — EMPAGLIFLOZIN 10 MG PO TABS: 10.0000 mg | ORAL_TABLET | Freq: Every day | ORAL | 1 refills | Status: DC

## 2023-08-17 MED ORDER — EZETIMIBE 10 MG PO TABS: 10.0000 mg | ORAL_TABLET | Freq: Every day | ORAL | 1 refills | Status: DC

## 2023-08-17 MED ORDER — CARVEDILOL 3.125 MG PO TABS: 3.1250 mg | ORAL_TABLET | Freq: Two times a day (BID) | ORAL | 1 refills | Status: DC

## 2023-08-17 NOTE — Telephone Encounter (Signed)
*  STAT* If patient is at the pharmacy, call can be transferred to refill team.   1. Which medications need to be refilled? (please list name of each medication and dose if known)   carvedilol (COREG) 3.125 MG tablet    empagliflozin (JARDIANCE) 10 MG TABS tablet    ENTRESTO 24-26 MG  ezetimibe (ZETIA) 10 MG tablet  rosuvastatin (CRESTOR) 40 MG tablet   2. Would you like to learn more about the convenience, safety, & potential cost savings by using the [facility]? No   3. Are you open to using the [facility] (Type [facility]. ) No   4. Which pharmacy/location (including street and city if local pharmacy) is medication to be sent to?  [id number] - [place], [place] - [address]     5. Do they need a 30 day or 90 day supply? 90 day  Pt has scheduled appt on 7/8

## 2023-08-17 NOTE — Telephone Encounter (Signed)
 Pt's medications were sent to pt's pharmacy as requested. Confirmation received.

## 2023-11-30 NOTE — Progress Notes (Unsigned)
 OFFICE NOTE:    Date:  12/01/2023  ID:  [name], DOB 1941-03-27, MRN [id number] PCP: Patient, No Pcp Per  [facility] Providers Cardiologist:  Ozell Fell, MD Cardiology APP:  Lelon Glendia DASEN, PA-C        Coronary artery disease  S/p inf STEMI in 10/19 tx with POBA of RCA then emergent CABG Post op AFib managed w/ Amiodarone  Myoview  02/13/2020: EF 33, inf-lat and inf AK, inf-lat and apical lat infarct, no peri-infarct ischemia  Cath 10/21: 3/3 grafts patent; med Rx  Heart failure with reduced ejection fraction  EF 35-45 at time of MI >> improved to normal Echocardiogram 10/21: EF 25-30, Gr 1 DD TTE 07/24/2020: Inf HK, ant-sept, inf-sept HK, sept AK, EF 35-40, mild LVH, Gr 1 DD, mild MR, Ao root 45 mm  Intol of Spiro (worsening renal fxn) Chronic kidney disease  Hyperlipidemia  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 History of Present Illness David [name] is a 83 y.o. male who returns for follow up of CAD, CHF. Last seen in July 2024.   He is here alone. He is doing well without chest discomfort or shortness of breath.  He feels 'lazy' and 'tired,' attributing it to arthritis, and experiences significant leg pain when climbing hills, described as joint pain and weakness rather than muscle pain. He has not had swelling in his legs, although he had an episode of gout in his L foot about a month ago. He engages in physical activity, including walking and mowing about 30 acres, and reports that his legs become weak and painful, particularly when climbing hills. He does not experience chest pain or shortness of breath during these activities, although his legs hurt before he becomes short of breath.     Review of Systems  Cardiovascular:  Positive for claudication.   -See HPI    Studies Reviewed:  EKG Interpretation Date/Time:  Tuesday December 01 2023 10:17:17 EDT Ventricular Rate:  69 PR Interval:  190 QRS Duration:  100 QT Interval:  394 QTC Calculation: 422 R Axis:   34  Text Interpretation: Normal sinus rhythm Inferior infarct Cannot rule out Anterior infarct Nonspecific ST and T wave abnormality No significant change since last tracing Confirmed by Lelon Glendia [phone]) on 12/01/2023 10:36:59 AM            Physical Exam:  VS:  BP 112/67   Pulse 69   Ht 6' (1.829 m)   Wt 211 lb 3.2 oz (95.8 kg)   SpO2 96%   BMI 28.64 kg/m        Wt Readings from Last 3 Encounters:  12/01/23 211 lb 3.2 oz (95.8 kg)  11/26/22 207 lb (93.9 kg)  09/27/21 205 lb (93 kg)    Constitutional:      Appearance: Healthy appearance. Not in distress.  Neck:     Vascular: No carotid bruit. JVD normal.  Pulmonary:     Breath sounds: Normal breath sounds. No wheezing. No rales.  Cardiovascular:     Normal rate. Regular rhythm.     Murmurs: There is no murmur.     Comments: DP/PT pulses are difficult to palpate bilaterally Edema:    Peripheral edema absent.  Abdominal:     Palpations: Abdomen  is soft.       Assessment and Plan:    Assessment & Plan Coronary artery disease involving native coronary artery of native heart without angina pectoris Status post inferior STEMI in 2019, treated with balloon angioplasty of the RCA and emergent CABG. Cardiac catheterization in October 2021 demonstrated patent bypass grafts. Currently asymptomatic with no symptoms of angina. He does not see primary care.  - Continue aspirin  81 mg daily - Continue carvedilol  3.125 mg twice daily - Continue Crestor  40 mg daily - CMET, direct LDL, CBC, TSH today - Follow up in one year HFrEF (heart failure with reduced ejection fraction) (HCC) EF did improve to normal after his MI but then dropped again. Workup in 2021 demonstrated patent bypass grafts. His EF improved to 35-40 on  GDMT.  He remains active. Currently he is NYHA II.  Volume status is stable.  He had worsening renal function in the past with spironolactone .    - Continue Coreg  3.125 mg twice daily - Continue Jardiance  10 mg daily - Continue Entresto  24/26 mg twice daily - CMET today Mixed hyperlipidemia He is not fasting today. Direct LDL was optimal last year on current Rx. - Obtain CMET and direct LDL today - Continue Zetia  10 mg daily - Continue Crestor  40 mg daily CKD (chronic kidney disease) stage 2, GFR 60-89 ml/min CMET today Pain in both lower extremities Complains of leg pain and weakness, particularly when walking uphill. Symptoms could be due to peripheral artery disease, arthritis, or back issues. - Arrange ABIs to rule out peripheral arterial disease.        Dispo:  Return in about 1 year (around 11/30/2024) for Routine Follow Up, w/ Glendia Ferrier, PA-C.  Signed, Glendia Ferrier, PA-C

## 2023-12-01 ENCOUNTER — Ambulatory Visit: Attending: Physician Assistant | Admitting: Physician Assistant

## 2023-12-01 ENCOUNTER — Encounter: Payer: Self-pay | Admitting: Physician Assistant

## 2023-12-01 VITALS — BP 112/67 | HR 69 | Ht 72.0 in | Wt 211.2 lb

## 2023-12-01 DIAGNOSIS — M79604 Pain in right leg: Secondary | ICD-10-CM

## 2023-12-01 DIAGNOSIS — E782 Mixed hyperlipidemia: Secondary | ICD-10-CM | POA: Diagnosis not present

## 2023-12-01 DIAGNOSIS — I5020 Unspecified systolic (congestive) heart failure: Secondary | ICD-10-CM

## 2023-12-01 DIAGNOSIS — I2510 Atherosclerotic heart disease of native coronary artery without angina pectoris: Secondary | ICD-10-CM | POA: Diagnosis not present

## 2023-12-01 DIAGNOSIS — N182 Chronic kidney disease, stage 2 (mild): Secondary | ICD-10-CM | POA: Diagnosis not present

## 2023-12-01 DIAGNOSIS — M79605 Pain in left leg: Secondary | ICD-10-CM

## 2023-12-01 NOTE — Assessment & Plan Note (Signed)
 EF did improve to normal after his MI but then dropped again. Workup in 2021 demonstrated patent bypass grafts. His EF improved to 35-40 on GDMT.  He remains active. Currently he is NYHA II.  Volume status is stable.  He had worsening renal function in the past with spironolactone .    - Continue Coreg  3.125 mg twice daily - Continue Jardiance  10 mg daily - Continue Entresto  24/26 mg twice daily - CMET today

## 2023-12-01 NOTE — Patient Instructions (Signed)
 Medication Instructions:  Your physician recommends that you continue on your current medications as directed. Please refer to the Current Medication list given to you today.  *If you need a refill on your cardiac medications before your next appointment, please call your pharmacy*  Lab Work: TODAY:  CMET, CBC, DIRECT LDL, & TSH  If you have labs (blood work) drawn today and your tests are completely normal, you will receive your results only by: MyChart Message (if you have MyChart) OR A paper copy in the mail If you have any lab test that is abnormal or we need to change your treatment, we will call you to review the results.  Testing/Procedures: Your physician recommends you have Vas US  ABI's.  Follow-Up: At [facility], you and your health needs are our priority.  As part of our continuing mission to provide you with exceptional heart care, our providers are all part of one team.  This team includes your primary Cardiologist (physician) and Advanced Practice Providers or APPs (Physician Assistants and Nurse Practitioners) who all work together to provide you with the care you need, when you need it.  Your next appointment:   12 month(s)  Provider:   Ozell Fell, MD or Glendia Ferrier, PA-C          We recommend signing up for the patient portal called MyChart.  Sign up information is provided on this After Visit Summary.  MyChart is used to connect with patients for Virtual Visits (Telemedicine).  Patients are able to view lab/test results, encounter notes, upcoming appointments, etc.  Non-urgent messages can be sent to your provider as well.   To learn more about what you can do with MyChart, go to [email or web address].   Other Instructions

## 2023-12-01 NOTE — Assessment & Plan Note (Signed)
 Status post inferior STEMI in 2019, treated with balloon angioplasty of the RCA and emergent CABG. Cardiac catheterization in October 2021 demonstrated patent bypass grafts. Currently asymptomatic with no symptoms of angina. He does not see primary care.  - Continue aspirin  81 mg daily - Continue carvedilol  3.125 mg twice daily - Continue Crestor  40 mg daily - CMET, direct LDL, CBC, TSH today - Follow up in one year

## 2023-12-01 NOTE — Assessment & Plan Note (Signed)
CMET today 

## 2023-12-01 NOTE — Assessment & Plan Note (Signed)
 He is not fasting today. Direct LDL was optimal last year on current Rx. - Obtain CMET and direct LDL today - Continue Zetia  10 mg daily - Continue Crestor  40 mg daily

## 2023-12-02 ENCOUNTER — Ambulatory Visit: Payer: Self-pay | Admitting: Physician Assistant

## 2023-12-02 DIAGNOSIS — Z79899 Other long term (current) drug therapy: Secondary | ICD-10-CM

## 2023-12-02 LAB — COMPREHENSIVE METABOLIC PANEL WITH GFR
ALT: 20 IU/L (ref 0–44)
AST: 20 IU/L (ref 0–40)
Albumin: 4.3 g/dL (ref 3.7–4.7)
Alkaline Phosphatase: 82 IU/L (ref 44–121)
BUN/Creatinine Ratio: 24 (ref 10–24)
BUN: 30 mg/dL — ABNORMAL HIGH (ref 8–27)
Bilirubin Total: 0.6 mg/dL (ref 0.0–1.2)
CO2: 18 mmol/L — ABNORMAL LOW (ref 20–29)
Calcium: 9.2 mg/dL (ref 8.6–10.2)
Chloride: 108 mmol/L — ABNORMAL HIGH (ref 96–106)
Creatinine, Ser: 1.26 mg/dL (ref 0.76–1.27)
Globulin, Total: 2.6 g/dL (ref 1.5–4.5)
Glucose: 112 mg/dL — ABNORMAL HIGH (ref 70–99)
Potassium: 5.1 mmol/L (ref 3.5–5.2)
Sodium: 141 mmol/L (ref 134–144)
Total Protein: 6.9 g/dL (ref 6.0–8.5)
eGFR: 57 mL/min/1.73 — ABNORMAL LOW (ref >59)

## 2023-12-02 LAB — CBC
Hematocrit: 47.4 % (ref 37.5–51.0)
Hemoglobin: 16.5 g/dL (ref 13.0–17.7)
MCH: 33.7 pg — ABNORMAL HIGH (ref 26.6–33.0)
MCHC: 34.8 g/dL (ref 31.5–35.7)
MCV: 97 fL (ref 79–97)
Platelets: 208 x10E3/uL (ref 150–450)
RBC: 4.89 x10E6/uL (ref 4.14–5.80)
RDW: 12.9 % (ref 11.6–15.4)
WBC: 7.5 x10E3/uL (ref 3.4–10.8)

## 2023-12-02 LAB — LDL CHOLESTEROL, DIRECT: LDL Direct: 50 mg/dL (ref 0–99)

## 2023-12-02 LAB — TSH: TSH: 1.52 u[IU]/mL (ref 0.450–4.500)

## 2023-12-14 ENCOUNTER — Ambulatory Visit (HOSPITAL_COMMUNITY)
Admission: RE | Admit: 2023-12-14 | Discharge: 2023-12-14 | Disposition: A | Discharge status: Home / Self Care | Source: Ambulatory Visit | Attending: Physician Assistant | Admitting: Physician Assistant

## 2023-12-14 DIAGNOSIS — M79605 Pain in left leg: Secondary | ICD-10-CM | POA: Insufficient documentation

## 2023-12-14 DIAGNOSIS — M79604 Pain in right leg: Secondary | ICD-10-CM | POA: Diagnosis present

## 2023-12-15 LAB — VAS US ABI WITH/WO TBI
Left ABI: 1.25
Right ABI: 1.28

## 2023-12-17 ENCOUNTER — Other Ambulatory Visit: Payer: Self-pay | Admitting: Physician Assistant

## 2024-03-19 ENCOUNTER — Other Ambulatory Visit: Payer: Self-pay | Admitting: Physician Assistant

## 2024-04-23 ENCOUNTER — Other Ambulatory Visit: Payer: Self-pay | Admitting: Physician Assistant
